# Patient Record
Sex: Male | Born: 1960 | Race: White | Hispanic: No | Marital: Married | Smoking: Former smoker
Health system: Southern US, Community
[De-identification: ages and names within clinical notes are randomized; demographics above are authoritative.]

## PROBLEM LIST (undated history)

## (undated) ENCOUNTER — Emergency Department (HOSPITAL_BASED_OUTPATIENT_CLINIC_OR_DEPARTMENT_OTHER): Disposition: A | Discharge status: Home / Self Care | Payer: BC Managed Care – PPO

## (undated) DIAGNOSIS — R55 Syncope and collapse: Secondary | ICD-10-CM

## (undated) DIAGNOSIS — M48061 Spinal stenosis, lumbar region without neurogenic claudication: Secondary | ICD-10-CM

## (undated) DIAGNOSIS — I480 Paroxysmal atrial fibrillation: Secondary | ICD-10-CM

## (undated) DIAGNOSIS — M1990 Unspecified osteoarthritis, unspecified site: Secondary | ICD-10-CM

## (undated) DIAGNOSIS — R079 Chest pain, unspecified: Secondary | ICD-10-CM

## (undated) DIAGNOSIS — I10 Essential (primary) hypertension: Secondary | ICD-10-CM

## (undated) DIAGNOSIS — G4700 Insomnia, unspecified: Secondary | ICD-10-CM

## (undated) DIAGNOSIS — K219 Gastro-esophageal reflux disease without esophagitis: Secondary | ICD-10-CM

## (undated) HISTORY — DX: Paroxysmal atrial fibrillation: I48.0

## (undated) HISTORY — DX: Essential (primary) hypertension: I10

## (undated) HISTORY — PX: TONSILLECTOMY: SUR1361

## (undated) HISTORY — PX: ANKLE RECONSTRUCTION: SHX1151

## (undated) HISTORY — PX: KNEE ARTHROSCOPY: SUR90

## (undated) HISTORY — PX: CLAVICLE SURGERY: SHX598

---

## 1998-11-05 ENCOUNTER — Ambulatory Visit (HOSPITAL_COMMUNITY): Admission: RE | Admit: 1998-11-05 | Discharge: 1998-11-05 | Payer: Self-pay | Admitting: Gastroenterology

## 1999-12-12 ENCOUNTER — Encounter: Payer: Self-pay | Admitting: Orthopedic Surgery

## 1999-12-12 ENCOUNTER — Inpatient Hospital Stay (HOSPITAL_COMMUNITY): Admission: EM | Admit: 1999-12-12 | Discharge: 1999-12-14 | Payer: Self-pay | Admitting: Emergency Medicine

## 2002-03-31 ENCOUNTER — Encounter: Payer: Self-pay | Admitting: Emergency Medicine

## 2002-03-31 ENCOUNTER — Emergency Department (HOSPITAL_COMMUNITY): Admission: EM | Admit: 2002-03-31 | Discharge: 2002-03-31 | Payer: Self-pay | Admitting: Emergency Medicine

## 2003-01-23 ENCOUNTER — Ambulatory Visit (HOSPITAL_COMMUNITY): Admission: RE | Admit: 2003-01-23 | Discharge: 2003-01-23 | Payer: Self-pay | Admitting: Orthopedic Surgery

## 2004-05-24 ENCOUNTER — Ambulatory Visit (HOSPITAL_BASED_OUTPATIENT_CLINIC_OR_DEPARTMENT_OTHER): Admission: RE | Admit: 2004-05-24 | Discharge: 2004-05-24 | Payer: Self-pay | Admitting: Orthopedic Surgery

## 2011-04-24 ENCOUNTER — Emergency Department (HOSPITAL_COMMUNITY)
Admission: EM | Admit: 2011-04-24 | Discharge: 2011-04-24 | Disposition: A | Discharge status: Home / Self Care | Payer: PRIVATE HEALTH INSURANCE | Attending: Emergency Medicine | Admitting: Emergency Medicine

## 2011-04-24 ENCOUNTER — Emergency Department (HOSPITAL_COMMUNITY): Payer: PRIVATE HEALTH INSURANCE

## 2011-04-24 ENCOUNTER — Encounter (HOSPITAL_COMMUNITY): Payer: Self-pay | Admitting: *Deleted

## 2011-04-24 DIAGNOSIS — K921 Melena: Secondary | ICD-10-CM | POA: Insufficient documentation

## 2011-04-24 DIAGNOSIS — R141 Gas pain: Secondary | ICD-10-CM | POA: Insufficient documentation

## 2011-04-24 DIAGNOSIS — R143 Flatulence: Secondary | ICD-10-CM | POA: Insufficient documentation

## 2011-04-24 DIAGNOSIS — R142 Eructation: Secondary | ICD-10-CM | POA: Insufficient documentation

## 2011-04-24 DIAGNOSIS — R109 Unspecified abdominal pain: Secondary | ICD-10-CM | POA: Insufficient documentation

## 2011-04-24 DIAGNOSIS — K5900 Constipation, unspecified: Secondary | ICD-10-CM | POA: Insufficient documentation

## 2011-04-24 LAB — COMPREHENSIVE METABOLIC PANEL
ALT: 21 U/L (ref 0–53)
AST: 21 U/L (ref 0–37)
Albumin: 4.1 g/dL (ref 3.5–5.2)
CO2: 24 mEq/L (ref 19–32)
Calcium: 9.6 mg/dL (ref 8.4–10.5)
Creatinine, Ser: 0.61 mg/dL (ref 0.50–1.35)
Sodium: 138 mEq/L (ref 135–145)
Total Protein: 7.1 g/dL (ref 6.0–8.3)

## 2011-04-24 LAB — DIFFERENTIAL
Basophils Absolute: 0 10*3/uL (ref 0.0–0.1)
Basophils Relative: 0 % (ref 0–1)
Eosinophils Absolute: 0.3 10*3/uL (ref 0.0–0.7)
Eosinophils Relative: 4 % (ref 0–5)
Lymphocytes Relative: 28 % (ref 12–46)
Monocytes Absolute: 0.6 10*3/uL (ref 0.1–1.0)

## 2011-04-24 LAB — URINALYSIS, ROUTINE W REFLEX MICROSCOPIC
Bilirubin Urine: NEGATIVE
Hgb urine dipstick: NEGATIVE
Specific Gravity, Urine: 1.028 (ref 1.005–1.030)
Urobilinogen, UA: 0.2 mg/dL (ref 0.0–1.0)
pH: 6 (ref 5.0–8.0)

## 2011-04-24 LAB — CBC
MCHC: 34.2 g/dL (ref 30.0–36.0)
MCV: 89.5 fL (ref 78.0–100.0)
Platelets: 186 10*3/uL (ref 150–400)
RDW: 14 % (ref 11.5–15.5)
WBC: 7.7 10*3/uL (ref 4.0–10.5)

## 2011-04-24 MED ORDER — IOHEXOL 300 MG/ML  SOLN: 20.0000 mL | INTRAMUSCULAR | Status: AC

## 2011-04-24 MED ORDER — FENTANYL CITRATE 0.05 MG/ML IJ SOLN
50.0000 ug | Freq: Once | INTRAMUSCULAR | Status: AC
  Administered 2011-04-24: 50 ug via INTRAVENOUS
  Filled 2011-04-24: qty 2

## 2011-04-24 MED ORDER — IOHEXOL 300 MG/ML  SOLN
100.0000 mL | Freq: Once | INTRAMUSCULAR | Status: AC | PRN
  Administered 2011-04-24: 100 mL via INTRAVENOUS

## 2011-04-24 NOTE — Discharge Instructions (Signed)

## 2011-04-24 NOTE — ED Notes (Signed)
Pt was picking up bags of topsoil and felt something in his belly but continued to work.  Later nite felt like he need to have bowel movement and had little.  Pt noticed increased gas and distention on right side of abdomen.  Pt sts abd is fuller and ? Hernia to RLQ.  Pt not passing any gas

## 2011-04-24 NOTE — ED Provider Notes (Addendum)
History     CSN: [id number]  Arrival date & time 04/24/11  1247   First MD Initiated Contact with Patient 04/24/11 1543      Chief Complaint  Patient presents with  . Abdominal Pain    (Consider location/radiation/quality/duration/timing/severity/associated sxs/prior treatment) HPI Comments: Patient presents with symptoms that began last night of right-sided abdominal pain.  Patient noted that he felt like he needed to have a bowel movement and did.  Approximately 20 minutes later he felt that he didn't have a small bowel movement.  He then throughout last night kept having that feeling but only pass flatus.  This morning he's had continuation of these symptoms without bowel movements.  He feels that his abdomen is distended and that there was an area in his right lower abdomen that have been more prominent than elsewhere.  This is a new finding for this patient.  He denies any nausea or vomiting or fevers.  No prior surgical history.  He did believe he saw blood mixed with the stool and one of his bowel movements but no others.  Patient is a 51 y.o. male presenting with abdominal pain. The history is provided by the patient. No language interpreter was used.  Abdominal Pain The primary symptoms of the illness include abdominal pain and hematochezia. The primary symptoms of the illness do not include fever, fatigue, shortness of breath, nausea, vomiting, diarrhea, hematemesis or dysuria. The current episode started yesterday. The onset of the illness was gradual. The problem has not changed since onset. Symptoms associated with the illness do not include chills, hematuria or back pain.    History reviewed. No pertinent past medical history.  History reviewed. No pertinent past surgical history.  History reviewed. No pertinent family history.  History  Substance Use Topics  . Smoking status: Former Games developer  . Smokeless tobacco: Not on file  . Alcohol Use: Yes     occ      Review  of Systems  Constitutional: Negative.  Negative for fever, chills and fatigue.  HENT: Negative.   Eyes: Negative.  Negative for discharge and redness.  Respiratory: Negative.  Negative for cough and shortness of breath.   Cardiovascular: Negative.  Negative for chest pain.  Gastrointestinal: Positive for abdominal pain, blood in stool and hematochezia. Negative for nausea, vomiting, diarrhea and hematemesis.  Genitourinary: Negative.  Negative for dysuria and hematuria.  Musculoskeletal: Negative.  Negative for back pain.  Skin: Negative.  Negative for color change and rash.  Neurological: Negative for syncope and headaches.  Hematological: Negative.  Negative for adenopathy.  Psychiatric/Behavioral: Negative.  Negative for confusion.  All other systems reviewed and are negative.    Allergies  Codeine and Sulfa antibiotics  Home Medications  No current outpatient prescriptions on file.  BP 137/89  Pulse 61  Temp(Src) 97.8 F (36.6 C) (Oral)  Resp 17  SpO2 95%  Physical Exam  Nursing note and vitals reviewed. Constitutional: He is oriented to person, place, and time. He appears well-developed and well-nourished.  Non-toxic appearance. He does not have a sickly appearance.  HENT:  Head: Normocephalic and atraumatic.  Eyes: Conjunctivae, EOM and lids are normal. Pupils are equal, round, and reactive to light.  Neck: Trachea normal, normal range of motion and full passive range of motion without pain. Neck supple.  Cardiovascular: Normal rate, regular rhythm and normal heart sounds.   Pulmonary/Chest: Effort normal and breath sounds normal. No respiratory distress.  Abdominal: Soft. Normal appearance. He exhibits distension. He exhibits  no mass. There is no tenderness. There is no rebound, no guarding and no CVA tenderness.       Mild abdominal distention but abdomen is soft with mild diffuse tenderness.  Genitourinary: Guaiac positive stool.       Normal sphincter tone.   Minimal stool in vault.  Weakly positive on Hemoccult card.  No gross blood or melena  Musculoskeletal: Normal range of motion.  Neurological: He is alert and oriented to person, place, and time. He has normal strength.  Skin: Skin is warm, dry and intact. No rash noted.  Psychiatric: He has a normal mood and affect. His behavior is normal. Judgment and thought content normal.    ED Course  Procedures (including critical care time)  Results for orders placed during the hospital encounter of 04/24/11  CBC      Component Value Range   WBC 7.7  4.0 - 10.5 (K/uL)   RBC 4.74  4.22 - 5.81 (MIL/uL)   Hemoglobin 14.5  13.0 - 17.0 (g/dL)   HCT 40.9  81.1 - 91.4 (%)   MCV 89.5  78.0 - 100.0 (fL)   MCH 30.6  26.0 - 34.0 (pg)   MCHC 34.2  30.0 - 36.0 (g/dL)   RDW 78.2  95.6 - 21.3 (%)   Platelets 186  150 - 400 (K/uL)  DIFFERENTIAL      Component Value Range   Neutrophils Relative 60  43 - 77 (%)   Neutro Abs 4.7  1.7 - 7.7 (K/uL)   Lymphocytes Relative 28  12 - 46 (%)   Lymphs Abs 2.2  0.7 - 4.0 (K/uL)   Monocytes Relative 7  3 - 12 (%)   Monocytes Absolute 0.6  0.1 - 1.0 (K/uL)   Eosinophils Relative 4  0 - 5 (%)   Eosinophils Absolute 0.3  0.0 - 0.7 (K/uL)   Basophils Relative 0  0 - 1 (%)   Basophils Absolute 0.0  0.0 - 0.1 (K/uL)  COMPREHENSIVE METABOLIC PANEL      Component Value Range   Sodium 138  135 - 145 (mEq/L)   Potassium 3.7  3.5 - 5.1 (mEq/L)   Chloride 103  96 - 112 (mEq/L)   CO2 24  19 - 32 (mEq/L)   Glucose, Bld 166 (*) 70 - 99 (mg/dL)   BUN 21  6 - 23 (mg/dL)   Creatinine, Ser 0.86  0.50 - 1.35 (mg/dL)   Calcium 9.6  8.4 - 57.8 (mg/dL)   Total Protein 7.1  6.0 - 8.3 (g/dL)   Albumin 4.1  3.5 - 5.2 (g/dL)   AST 21  0 - 37 (U/L)   ALT 21  0 - 53 (U/L)   Alkaline Phosphatase 73  39 - 117 (U/L)   Total Bilirubin 0.2 (*) 0.3 - 1.2 (mg/dL)   GFR calc non Af Amer >90  >90 (mL/min)   GFR calc Af Amer >90  >90 (mL/min)  URINALYSIS, ROUTINE W REFLEX MICROSCOPIC       Component Value Range   Color, Urine YELLOW  YELLOW    APPearance CLEAR  CLEAR    Specific Gravity, Urine 1.028  1.005 - 1.030    pH 6.0  5.0 - 8.0    Glucose, UA NEGATIVE  NEGATIVE (mg/dL)   Hgb urine dipstick NEGATIVE  NEGATIVE    Bilirubin Urine NEGATIVE  NEGATIVE    Ketones, ur NEGATIVE  NEGATIVE (mg/dL)   Protein, ur NEGATIVE  NEGATIVE (mg/dL)   Urobilinogen, UA 0.2  0.0 - 1.0 (mg/dL)   Nitrite NEGATIVE  NEGATIVE    Leukocytes, UA NEGATIVE  NEGATIVE    Ct Abdomen Pelvis W Contrast  04/24/2011  *RADIOLOGY REPORT*  Clinical Data: Right-sided pain.  Decreased bowel movements. Nausea and vomiting.  CT ABDOMEN AND PELVIS WITH CONTRAST  Technique:  Multidetector CT imaging of the abdomen and pelvis was performed following the standard protocol during bolus administration of intravenous contrast.  Contrast: OMNIPAQUE IOHEXOL 300 MG/ML  SOLN  Comparison: None.  Findings: No extraluminal bowel inflammatory process, free fluid or free air.  Fatty liver.  Tiny low density lesion left lobe possibly a cyst. No focal splenic, pancreatic, adrenal or left renal lesion.  Sub centimeter lower pole right renal lesion too small to characterize although statistically likely a cyst.  Atherosclerotic type changes including calcification of the mid to lower abdominal aorta and iliac arteries consistent with advanced atherosclerotic type changes for the patient's age.  Minimal atelectatic changes/scarring lung bases.  Top normal sized prostate gland with calcifications.  Clinical and laboratory correlation recommended.  Degenerative changes lumbar spine without bony destructive lesion. Mild hip joint degenerative changes.  IMPRESSION: No extraluminal bowel inflammatory process, free fluid or free air.  Fatty liver.  Atherosclerotic type change advanced for the patient's age.  Top normal sized prostate gland with calcifications.  Please see above.  Original Report Authenticated By: Fuller Canada, M.D.   Dg Abd  Acute W/chest  04/24/2011  *RADIOLOGY REPORT*  Clinical Data: Abdominal pain, bloating, rule out small bowel obstruction  ACUTE ABDOMEN SERIES (ABDOMEN 2 VIEW & CHEST 1 VIEW)  Comparison: None.  Findings: Cardiomediastinal silhouette is unremarkable.  No acute infiltrate or pleural effusion.  No pulmonary edema.  There is nonspecific nonobstructive bowel gas pattern.  No free abdominal air is noted.  Moderate stool noted in the right colon.  IMPRESSION: No acute disease.  Nonspecific nonobstructive bowel gas pattern. No free abdominal air.  Original Report Authenticated By: Natasha Mead, M.D.      MDM  Patient presents with right-sided abdominal pain with change in bowel habits.  He is lower risk for small bowel obstruction since he has no surgical history.  Patient is afebrile here with some mild tenderness on exam.  I will obtain a CT abdomen pelvis based on the patient's decreased bowel movements with increased pain as it may be an atypical presentation for appendicitis.        Nat Christen, MD 04/24/11 1754  Nat Christen, MD 04/24/11 [phone]  Patient with no acute pathology on his CT scan.  Patient may have a constipation or viral process at this time and I feel he is safe for discharge home.  Nat Christen, MD 04/24/11 [phone]

## 2012-01-12 ENCOUNTER — Other Ambulatory Visit: Payer: Self-pay | Admitting: Surgical

## 2012-01-24 NOTE — H&P (Signed)
Justin Young is an 52 y.o. male.   Chief Complaint: left knee pain HPI: The patient is a 52 year old male who presented with the chief complaint of left knee pain. He had a sudden onset of pain in the medial aspect of his left knee. He was sitting on the floor watching TV and had an immediate pain like something was happening in the medial side of his knee. He was doing fine before that he said. He had two previous knee arthroscopies by Dr. Montez Morita on the opposite right knee. MRI revealed complex tear of the medial meniscus in the left knee.    Past Medical History No pertinent past medical history  Past Surgical History Tonsillectomy Bilateral knee arthroscopy Left ankle ORIF Vasectomy  Family History Mother: HTN, DM type II, CKD, cancer Father: heart disease, HTN, CVA  Social History:  reports that he has quit smoking. He does not have any smokeless tobacco history on file. He reports that he drinks alcohol. He reports that he does not use illicit drugs.  Allergies:  Allergies  Allergen Reactions  . Codeine Itching  . Sulfa Antibiotics Other (See Comments)    From when younger.    Review of Systems  Constitutional: Negative.   HENT: Negative.  Negative for neck pain.   Eyes: Negative.   Respiratory: Negative.   Cardiovascular: Negative.   Gastrointestinal: Negative.   Genitourinary: Negative.   Musculoskeletal: Positive for joint pain. Negative for myalgias, back pain and falls.       Left knee pain  Skin: Negative.   Neurological: Negative.   Endo/Heme/Allergies: Negative.   Psychiatric/Behavioral: Negative.    Vitals Weight: 215 lb Height: 70.25 in Body Surface Area: 2.2 m Body Mass Index: 30.63 kg/m Pulse: 64 (Regular) BP: 130/85 (Sitting, Left Arm, Standard)  Physical Exam  Constitutional: He is oriented to person, place, and time. He appears well-developed and well-nourished. No distress.  HENT:  Head: Normocephalic and atraumatic.  Right Ear:  External ear normal.  Left Ear: External ear normal.  Nose: Nose normal.  Mouth/Throat: Oropharynx is clear and moist.  Eyes: Conjunctivae normal and EOM are normal.  Neck: Neck supple. No tracheal deviation present. No thyromegaly present.  Cardiovascular: Normal rate, regular rhythm, normal heart sounds and intact distal pulses.   No murmur heard. Respiratory: Effort normal and breath sounds normal. No respiratory distress. He has no wheezes. He exhibits no tenderness.  GI: Soft. Bowel sounds are normal. He exhibits no distension and no mass. There is no tenderness.  Musculoskeletal:       Right hip: Normal.       Left hip: Normal.       Right knee: Normal.       Left knee: He exhibits decreased range of motion and effusion. He exhibits no erythema. tenderness found. Medial joint line tenderness noted.       Right lower leg: He exhibits no tenderness and no swelling.       Left lower leg: He exhibits no tenderness and no swelling.  Lymphadenopathy:    He has no cervical adenopathy.  Neurological: He is alert and oriented to person, place, and time. He has normal strength. No sensory deficit.  Skin: No rash noted. He is not diaphoretic. No erythema.  Psychiatric: He has a normal mood and affect. His behavior is normal.     Assessment/Plan Medial meniscus tear in left knee He will undergo a left knee arthroscopy with medial menisectomy. This will be an outpatient  procedure. Risks and benefits of the surgery were discussed with the patient by Dr. Darrelyn Hillock.  Sherlene Rickel LAUREN 01/24/2012, 2:11 PM

## 2012-01-25 ENCOUNTER — Encounter (HOSPITAL_BASED_OUTPATIENT_CLINIC_OR_DEPARTMENT_OTHER): Payer: Self-pay | Admitting: *Deleted

## 2012-01-25 NOTE — Progress Notes (Signed)
To [facility] at 1145-Hg on arrival-possible EKG-Npo after mn-instructed to bring contact case/solution.

## 2012-01-27 ENCOUNTER — Encounter (HOSPITAL_BASED_OUTPATIENT_CLINIC_OR_DEPARTMENT_OTHER)
Admission: RE | Disposition: A | Discharge status: Home / Self Care | Payer: Self-pay | Source: Ambulatory Visit | Attending: Orthopedic Surgery

## 2012-01-27 ENCOUNTER — Encounter (HOSPITAL_BASED_OUTPATIENT_CLINIC_OR_DEPARTMENT_OTHER): Payer: Self-pay | Admitting: Anesthesiology

## 2012-01-27 ENCOUNTER — Ambulatory Visit (HOSPITAL_BASED_OUTPATIENT_CLINIC_OR_DEPARTMENT_OTHER)
Admission: RE | Admit: 2012-01-27 | Discharge: 2012-01-27 | Disposition: A | Discharge status: Home / Self Care | Payer: PRIVATE HEALTH INSURANCE | Source: Ambulatory Visit | Attending: Orthopedic Surgery | Admitting: Orthopedic Surgery

## 2012-01-27 ENCOUNTER — Ambulatory Visit (HOSPITAL_BASED_OUTPATIENT_CLINIC_OR_DEPARTMENT_OTHER): Payer: PRIVATE HEALTH INSURANCE | Admitting: Anesthesiology

## 2012-01-27 ENCOUNTER — Encounter (HOSPITAL_BASED_OUTPATIENT_CLINIC_OR_DEPARTMENT_OTHER): Payer: Self-pay | Admitting: *Deleted

## 2012-01-27 DIAGNOSIS — S83242A Other tear of medial meniscus, current injury, left knee, initial encounter: Secondary | ICD-10-CM | POA: Diagnosis present

## 2012-01-27 DIAGNOSIS — M1710 Unilateral primary osteoarthritis, unspecified knee: Secondary | ICD-10-CM | POA: Insufficient documentation

## 2012-01-27 DIAGNOSIS — M1712 Unilateral primary osteoarthritis, left knee: Secondary | ICD-10-CM | POA: Diagnosis present

## 2012-01-27 DIAGNOSIS — M23329 Other meniscus derangements, posterior horn of medial meniscus, unspecified knee: Secondary | ICD-10-CM | POA: Insufficient documentation

## 2012-01-27 HISTORY — DX: Insomnia, unspecified: G47.00

## 2012-01-27 HISTORY — PX: KNEE ARTHROSCOPY WITH MEDIAL MENISECTOMY: SHX5651

## 2012-01-27 HISTORY — DX: Gastro-esophageal reflux disease without esophagitis: K21.9

## 2012-01-27 SURGERY — ARTHROSCOPY, KNEE, WITH MEDIAL MENISCECTOMY
Anesthesia: General | Site: Knee | Laterality: Left | Wound class: Clean Contaminated

## 2012-01-27 MED ORDER — LACTATED RINGERS IV SOLN
INTRAVENOUS | Status: DC
  Administered 2012-01-27: 12:00:00 via INTRAVENOUS
  Filled 2012-01-27: qty 1000

## 2012-01-27 MED ORDER — LIDOCAINE HCL (CARDIAC) 20 MG/ML IV SOLN
INTRAVENOUS | Status: DC | PRN
  Administered 2012-01-27: 80 mg via INTRAVENOUS

## 2012-01-27 MED ORDER — ACETAMINOPHEN 10 MG/ML IV SOLN
1000.0000 mg | Freq: Once | INTRAVENOUS | Status: DC | PRN
  Filled 2012-01-27: qty 100

## 2012-01-27 MED ORDER — MEPERIDINE HCL 25 MG/ML IJ SOLN
6.2500 mg | INTRAMUSCULAR | Status: DC | PRN
  Filled 2012-01-27: qty 1

## 2012-01-27 MED ORDER — CHLORHEXIDINE GLUCONATE 4 % EX LIQD
60.0000 mL | Freq: Once | CUTANEOUS | Status: DC
  Filled 2012-01-27: qty 60

## 2012-01-27 MED ORDER — PROMETHAZINE HCL 25 MG/ML IJ SOLN
6.2500 mg | INTRAMUSCULAR | Status: DC | PRN
  Filled 2012-01-27: qty 1

## 2012-01-27 MED ORDER — SODIUM CHLORIDE 0.9 % IR SOLN
Status: DC | PRN
  Administered 2012-01-27: 12000 mL

## 2012-01-27 MED ORDER — CEFAZOLIN SODIUM-DEXTROSE 2-3 GM-% IV SOLR
2.0000 g | INTRAVENOUS | Status: AC
  Administered 2012-01-27: 2 g via INTRAVENOUS
  Filled 2012-01-27: qty 50

## 2012-01-27 MED ORDER — FENTANYL CITRATE 0.05 MG/ML IJ SOLN
INTRAMUSCULAR | Status: DC | PRN
  Administered 2012-01-27: 100 ug via INTRAVENOUS
  Administered 2012-01-27 (×2): 50 ug via INTRAVENOUS

## 2012-01-27 MED ORDER — OXYCODONE HCL 5 MG/5ML PO SOLN
5.0000 mg | Freq: Once | ORAL | Status: AC | PRN
  Filled 2012-01-27: qty 5

## 2012-01-27 MED ORDER — HYDROMORPHONE HCL PF 1 MG/ML IJ SOLN
0.2500 mg | INTRAMUSCULAR | Status: DC | PRN
  Administered 2012-01-27 (×2): 0.5 mg via INTRAVENOUS
  Filled 2012-01-27: qty 1

## 2012-01-27 MED ORDER — ONDANSETRON HCL 4 MG/2ML IJ SOLN
INTRAMUSCULAR | Status: DC | PRN
  Administered 2012-01-27: 4 mg via INTRAVENOUS

## 2012-01-27 MED ORDER — ACETAMINOPHEN 10 MG/ML IV SOLN
INTRAVENOUS | Status: DC | PRN
  Administered 2012-01-27: 1000 mg via INTRAVENOUS

## 2012-01-27 MED ORDER — LACTATED RINGERS IV SOLN
INTRAVENOUS | Status: DC
  Filled 2012-01-27: qty 1000

## 2012-01-27 MED ORDER — OXYCODONE-ACETAMINOPHEN 5-325 MG PO TABS
1.0000 | ORAL_TABLET | ORAL | Status: DC | PRN
  Administered 2012-01-27: 1 via ORAL
  Filled 2012-01-27: qty 1

## 2012-01-27 MED ORDER — MIDAZOLAM HCL 5 MG/5ML IJ SOLN
INTRAMUSCULAR | Status: DC | PRN
  Administered 2012-01-27: 2 mg via INTRAVENOUS

## 2012-01-27 MED ORDER — DEXAMETHASONE SODIUM PHOSPHATE 4 MG/ML IJ SOLN
INTRAMUSCULAR | Status: DC | PRN
  Administered 2012-01-27: 10 mg via INTRAVENOUS

## 2012-01-27 MED ORDER — BUPIVACAINE-EPINEPHRINE 0.25% -1:200000 IJ SOLN
INTRAMUSCULAR | Status: DC | PRN
  Administered 2012-01-27: 30 mL

## 2012-01-27 MED ORDER — PROPOFOL 10 MG/ML IV BOLUS
INTRAVENOUS | Status: DC | PRN
  Administered 2012-01-27: 200 mg via INTRAVENOUS

## 2012-01-27 MED ORDER — OXYCODONE HCL 5 MG PO TABS
5.0000 mg | ORAL_TABLET | Freq: Once | ORAL | Status: AC | PRN
  Administered 2012-01-27: 5 mg via ORAL
  Filled 2012-01-27: qty 1

## 2012-01-27 MED ORDER — OXYCODONE-ACETAMINOPHEN 10-650 MG PO TABS: 1.0000 | ORAL_TABLET | Freq: Four times a day (QID) | ORAL | Status: DC | PRN

## 2012-01-27 MED ORDER — BACITRACIN-NEOMYCIN-POLYMYXIN 400-5-5000 EX OINT
TOPICAL_OINTMENT | CUTANEOUS | Status: DC | PRN
  Administered 2012-01-27: 1 via TOPICAL

## 2012-01-27 SURGICAL SUPPLY — 42 items
BANDAGE ELASTIC 4 VELCRO ST LF (GAUZE/BANDAGES/DRESSINGS) ×2 IMPLANT
BANDAGE ELASTIC 6 VELCRO ST LF (GAUZE/BANDAGES/DRESSINGS) ×2 IMPLANT
BLADE CUDA 5.5 (BLADE) IMPLANT
BLADE CUTTER GATOR 3.5 (BLADE) IMPLANT
BLADE CUTTER MENIS 5.5 (BLADE) IMPLANT
BLADE GREAT WHITE 4.2 (BLADE) ×2 IMPLANT
BLADE SURG 15 STRL LF DISP TIS (BLADE) IMPLANT
BLADE SURG 15 STRL SS (BLADE)
BNDG COHESIVE 4X5 TAN NS LF (GAUZE/BANDAGES/DRESSINGS) ×2 IMPLANT
CANISTER SUCT LVC 12 LTR MEDI- (MISCELLANEOUS) ×2 IMPLANT
CANISTER SUCTION 2500CC (MISCELLANEOUS) ×2 IMPLANT
CLOTH BEACON ORANGE TIMEOUT ST (SAFETY) ×2 IMPLANT
DRAPE ARTHROSCOPY W/POUCH 114 (DRAPES) ×2 IMPLANT
DRAPE LG THREE QUARTER DISP (DRAPES) ×2 IMPLANT
DRSG EMULSION OIL 3X3 NADH (GAUZE/BANDAGES/DRESSINGS) ×2 IMPLANT
DRSG PAD ABDOMINAL 8X10 ST (GAUZE/BANDAGES/DRESSINGS) ×2 IMPLANT
DURAPREP 26ML APPLICATOR (WOUND CARE) ×2 IMPLANT
ELECT MENISCUS 165MM 90D (ELECTRODE) IMPLANT
ELECT REM PT RETURN 9FT ADLT (ELECTROSURGICAL)
ELECTRODE REM PT RTRN 9FT ADLT (ELECTROSURGICAL) IMPLANT
GLOVE ECLIPSE 7.0 STRL STRAW (GLOVE) ×2 IMPLANT
GLOVE ECLIPSE 8.0 STRL XLNG CF (GLOVE) ×2 IMPLANT
GLOVE INDICATOR 7.0 STRL GRN (GLOVE) ×2 IMPLANT
GLOVE INDICATOR 8.5 STRL (GLOVE) ×4 IMPLANT
GLOVE SURG ORTHO 6.0 STRL STRW (GLOVE) ×2 IMPLANT
GOWN PREVENTION PLUS LG XLONG (DISPOSABLE) ×2 IMPLANT
GOWN STRL REIN XL XLG (GOWN DISPOSABLE) ×2 IMPLANT
KNEE WRAP E Z 3 GEL PACK (MISCELLANEOUS) ×2 IMPLANT
MINI VAC (SURGICAL WAND) ×2 IMPLANT
PACK ARTHROSCOPY DSU (CUSTOM PROCEDURE TRAY) ×2 IMPLANT
PACK BASIN DAY SURGERY FS (CUSTOM PROCEDURE TRAY) ×2 IMPLANT
PADDING CAST COTTON 6X4 STRL (CAST SUPPLIES) ×2 IMPLANT
PENCIL BUTTON HOLSTER BLD 10FT (ELECTRODE) IMPLANT
SET ARTHROSCOPY TUBING (MISCELLANEOUS) ×2
SET ARTHROSCOPY TUBING LN (MISCELLANEOUS) ×1 IMPLANT
SPONGE GAUZE 4X4 12PLY (GAUZE/BANDAGES/DRESSINGS) ×2 IMPLANT
SPONGE SURGIFOAM ABS GEL 12-7 (HEMOSTASIS) IMPLANT
SUT ETHILON 3 0 PS 1 (SUTURE) ×2 IMPLANT
SYR CONTROL 10ML LL (SYRINGE) IMPLANT
TOWEL OR 17X24 6PK STRL BLUE (TOWEL DISPOSABLE) ×2 IMPLANT
WAND 90 DEG TURBOVAC W/CORD (SURGICAL WAND) ×2 IMPLANT
WATER STERILE IRR 500ML POUR (IV SOLUTION) ×2 IMPLANT

## 2012-01-27 NOTE — Brief Op Note (Signed)
01/27/2012  2:16 PM  PATIENT:  Justin Young  52 y.o. male  PRE-OPERATIVE DIAGNOSIS:  LEFT KNEE MEDIAL MENISCAL TEAR,and Osteoarthritis POST-OPERATIVE DIAGNOSIS:  Left knee medial meniscal tear, and Osteoarthritis  PROCEDURE:  Procedure(s) (LRB) with comments: KNEE ARTHROSCOPY WITH MEDIAL MENISECTOMY (Left)and Synovectomy of Suprapatellar Pouch,Abraision Chondroplasty Of Medial Femoral Condyle and Microfracture Technique Of Med.Femoral Condyle.Diagnostic Arthroscopy of Left Knee. SURGEON:  Surgeon(s) and Role:    * Jacki Cones, MD - Primary    ASSISTANTS: OR Nurse  ANESTHESIA:   general  EBL:  Total I/O In: 200 [I.V.:200] Out: -   BLOOD ADMINISTERED:none  DRAINS: none   LOCAL MEDICATIONS USED:  MARCAINE 30cc of 0.25% Marcaine with Epinephrine.    SPECIMEN:  No Specimen  DISPOSITION OF SPECIMEN:  N/A  COUNTS:  YES  TOURNIQUET:  * No tourniquets in log *  DICTATION: .Other Dictation: Dictation Number [id number]  PLAN OF CARE: Discharge to home after PACU  PATIENT DISPOSITION:  PACU - hemodynamically stable.   Delay start of Pharmacological VTE agent (>24hrs) due to surgical blood loss or risk of bleeding: yes

## 2012-01-27 NOTE — Anesthesia Postprocedure Evaluation (Signed)
  Anesthesia Post-op Note  Patient: Justin Young  Procedure(s) Performed: Procedure(s) (LRB): KNEE ARTHROSCOPY WITH MEDIAL MENISECTOMY (Left)  Patient Location: PACU  Anesthesia Type: General  Level of Consciousness: awake and alert   Airway and Oxygen Therapy: Patient Spontanous Breathing  Post-op Pain: mild  Post-op Assessment: Post-op Vital signs reviewed, Patient's Cardiovascular Status Stable, Respiratory Function Stable, Patent Airway and No signs of Nausea or vomiting  Last Vitals:  Filed Vitals:   01/27/12 1506  BP:   Pulse: 72  Temp:   Resp: 16    Post-op Vital Signs: stable   Complications: No apparent anesthesia complications

## 2012-01-27 NOTE — Anesthesia Procedure Notes (Signed)
Procedure Name: LMA Insertion Date/Time: 01/27/2012 1:28 PM Performed by: Norva Pavlov Pre-anesthesia Checklist: Patient identified, Emergency Drugs available, Suction available and Patient being monitored Patient Re-evaluated:Patient Re-evaluated prior to inductionOxygen Delivery Method: Circle System Utilized Preoxygenation: Pre-oxygenation with 100% oxygen Intubation Type: IV induction Ventilation: Mask ventilation without difficulty LMA: LMA inserted LMA Size: 4.0 Number of attempts: 1 Airway Equipment and Method: bite block Placement Confirmation: positive ETCO2 Tube secured with: Tape Dental Injury: Teeth and Oropharynx as per pre-operative assessment

## 2012-01-27 NOTE — H&P (View-Only) (Signed)
To [facility] at 1145-Hg on arrival-possible EKG-Npo after mn-instructed to bring contact case/solution. 

## 2012-01-27 NOTE — Transfer of Care (Signed)
Immediate Anesthesia Transfer of Care Note  Patient: Justin Young  Procedure(s) Performed: Procedure(s) (LRB): KNEE ARTHROSCOPY WITH MEDIAL MENISECTOMY (Left)  Patient Location: PACU  Anesthesia Type: General  Level of Consciousness: awake, alert  and oriented  Airway & Oxygen Therapy: Patient Spontanous Breathing and Patient connected to face mask oxygen  Post-op Assessment: Report given to PACU RN and Post -op Vital signs reviewed and stable  Post vital signs: Reviewed and stable  Complications: No apparent anesthesia complications

## 2012-01-27 NOTE — Interval H&P Note (Signed)
History and Physical Interval Note:  01/27/2012 1:13 PM  Justin Young  has presented today for surgery, with the diagnosis of LEFT KNEE MEDIAL MENISCAL TEAR  The various methods of treatment have been discussed with the patient and family. After consideration of risks, benefits and other options for treatment, the patient has consented to  Procedure(s) (LRB) with comments: KNEE ARTHROSCOPY WITH MEDIAL MENISECTOMY (Left) as a surgical intervention .  The patient's history has been reviewed, patient examined, no change in status, stable for surgery.  I have reviewed the patient's chart and labs.  Questions were answered to the patient's satisfaction.     Chalmers Iddings A

## 2012-01-27 NOTE — Anesthesia Preprocedure Evaluation (Addendum)
Anesthesia Evaluation  Patient identified by MRN, date of birth, ID band Patient awake    Reviewed: Allergy & Precautions, H&P , NPO status , Patient's Chart, lab work & pertinent test results  Airway Mallampati: II TM Distance: >3 FB Neck ROM: Full    Dental  (+) Dental Advisory Given and Teeth Intact   Pulmonary former smoker,  breath sounds clear to auscultation  Pulmonary exam normal       Cardiovascular - Past MI and - CHF Rhythm:Regular Rate:Normal     Neuro/Psych negative neurological ROS  negative psych ROS   GI/Hepatic Neg liver ROS, GERD-  Medicated,  Endo/Other  negative endocrine ROS  Renal/GU negative Renal ROS     Musculoskeletal negative musculoskeletal ROS (+)   Abdominal   Peds  Hematology negative hematology ROS (+)   Anesthesia Other Findings   Reproductive/Obstetrics                          Anesthesia Physical Anesthesia Plan  ASA: II  Anesthesia Plan: General   Post-op Pain Management:    Induction: Intravenous  Airway Management Planned: LMA  Additional Equipment:   Intra-op Plan:   Post-operative Plan: Extubation in OR  Informed Consent: I have reviewed the patients History and Physical, chart, labs and discussed the procedure including the risks, benefits and alternatives for the proposed anesthesia with the patient or authorized representative who has indicated his/her understanding and acceptance.   Dental advisory given  Plan Discussed with: CRNA  Anesthesia Plan Comments:         Anesthesia Quick Evaluation

## 2012-01-28 NOTE — Anesthesia Postprocedure Evaluation (Signed)
Anesthesia Post Note  Patient: Justin Young  Procedure(s) Performed: Procedure(s) (LRB): KNEE ARTHROSCOPY WITH MEDIAL MENISECTOMY (Left)  Anesthesia type: General  Patient location: PACU  Post pain: Pain level controlled  Post assessment: Post-op Vital signs reviewed  Last Vitals: BP 155/105  Pulse 71  Temp 36.1 C (Oral)  Resp 16  Ht 5' 10.5" (1.791 m)  Wt 207 lb 5 oz (94.036 kg)  BMI 29.33 kg/m2  SpO2 93%  Post vital signs: Reviewed  Level of consciousness: sedated  Complications: No apparent anesthesia complications

## 2012-01-30 ENCOUNTER — Encounter (HOSPITAL_BASED_OUTPATIENT_CLINIC_OR_DEPARTMENT_OTHER): Payer: Self-pay | Admitting: Orthopedic Surgery

## 2012-01-30 NOTE — Op Note (Signed)
Justin Young, Justin Young               ACCOUNT NO.:  [id number]  MEDICAL RECORD NO.:  [id number]  LOCATION:                                 FACILITY:  PHYSICIAN:  Georges Lynch. Darrelyn Hillock, M.D.     DATE OF BIRTH:  DATE OF PROCEDURE:  01/27/2012 DATE OF DISCHARGE:                              OPERATIVE REPORT   SURGEON:  Georges Lynch. Roxana Lai, M.D.  ASSISTANT:  OR nurse.  PREOPERATIVE DIAGNOSES: 1. Degenerative arthritis, left knee. 2. Torn medial meniscus of the posterior horn, left knee.  POSTOPERATIVE DIAGNOSES: 1. Degenerative arthritis, left knee. 2. Torn medial meniscus of the posterior horn, left knee.  OPERATION: 1. Diagnostic arthroscopy, left knee. 2. Medial meniscectomy, left knee. 3. Abrasion chondroplasty to bleeding bone, medial femoral condyle,     left knee. 4. Microfracture technique, medial femoral condyle, left knee. 5. Synovectomy of suprapatellar pouch, left knee.  PROCEDURE:  Under general anesthesia, routine orthopedic prepping and draping in the left lower extremity was carried out.  The patient's left leg was placed in a knee holder.  He had 2 g of IV Ancef preop.  At this time, the appropriate time-out was carried out first.  I also marked the appropriate left leg in the holding area.  Small punctate incision was made in the suprapatellar pouch, the inflow cannula was inserted in the knee and was distended with saline.  Another small punctate incision was made in the anterolateral joint.  The arthroscope was entered from lateral approach and a complete diagnostic arthroscopy was carried out. The cruciates were first of all were intact.  I went up in the suprapatellar pouch.  The patellofemoral joint looked fine.  He had a chronic synovitis.  I did a synovectomy utilizing the ArthroCare.  I went down in the lateral joint and he had some mild early arthritic changes, but the meniscus was intact.  The cruciates were intact.  I went over the medial joint, he had  significant osteoarthritic changes especially at the medial femoral condyle.  I introduced a shaver suction device first, cleaned off the condyles and did an abrasion chondroplasty and then utilized the microfracture technique to bleeding bone on the medial femoral condyle.  Once this was completed and the medial meniscectomy was completed, I went up into the suprapatellar pouch and did a synovectomy in the suprapatellar pouch. 1. I did a diagnostic arthroscopy, left knee. 2. Synovectomy, suprapatellar pouch, left knee. 3. A medial meniscectomy, left knee. 4. Abrasion chondroplasty, medial femoral condyle, left knee. 5. Microfracture technique, medial femoral condyle, left knee.          ______________________________ Georges Lynch Darrelyn Hillock, M.D.     RAG/MEDQ  D:  01/27/2012  T:  01/28/2012  Job:  [id number]

## 2012-02-22 ENCOUNTER — Telehealth: Payer: Self-pay | Admitting: Internal Medicine

## 2012-02-22 NOTE — Telephone Encounter (Signed)
This is Justin Young son and he is wanting to see Dr. Debby Bud as a new patient, would you be willing to accept? He is having blood pressure problems and wants to be worked in rather soon, please advise

## 2012-02-22 NOTE — Telephone Encounter (Signed)
K

## 2012-02-23 ENCOUNTER — Other Ambulatory Visit (INDEPENDENT_AMBULATORY_CARE_PROVIDER_SITE_OTHER): Payer: PRIVATE HEALTH INSURANCE

## 2012-02-23 ENCOUNTER — Encounter: Payer: Self-pay | Admitting: Internal Medicine

## 2012-02-23 ENCOUNTER — Ambulatory Visit (INDEPENDENT_AMBULATORY_CARE_PROVIDER_SITE_OTHER): Payer: PRIVATE HEALTH INSURANCE | Admitting: Internal Medicine

## 2012-02-23 VITALS — BP 122/86 | HR 79 | Temp 98.0°F | Resp 10 | Ht 70.0 in | Wt 205.0 lb

## 2012-02-23 DIAGNOSIS — E279 Disorder of adrenal gland, unspecified: Secondary | ICD-10-CM

## 2012-02-23 DIAGNOSIS — I10 Essential (primary) hypertension: Secondary | ICD-10-CM

## 2012-02-23 MED ORDER — METOPROLOL TARTRATE 25 MG PO TABS: 25.0000 mg | ORAL_TABLET | Freq: Two times a day (BID) | ORAL | Status: DC

## 2012-02-23 NOTE — Telephone Encounter (Signed)
Patient scheduled.

## 2012-02-23 NOTE — Progress Notes (Signed)
Subjective:    Patient ID: Justin Young, male    DOB: [birth year], 52 y.o.   MRN: [id number]  HPI Mr. Crite presents for acute evaluation for blood pressure issues. He was told 6 months ago that his BP was up. He had arthroscopic knee surgery and his BP was elevated. Several days after surgery he had an episode of palor, sweating, tremulous. He has 5+ episodes over the last several days. He does admit to a feeling of being "adrenalized." He has previously been very healthy.  Past Medical History  Diagnosis Date  . GERD (gastroesophageal reflux disease)     mild -rolaids if necessary  . Insomnia   . Hypertension    Past Surgical History  Procedure Laterality Date  . Knee arthroscopy  2006-most current    left knee  . Clavicle surgery      bilateral  . Ankle reconstruction  left    3 procedures total  . Tonsillectomy    . Knee arthroscopy with medial menisectomy  01/27/2012    Procedure: KNEE ARTHROSCOPY WITH MEDIAL MENISECTOMY;  Surgeon: Jacki Cones, MD;  Location: [facility];  Service: Orthopedics;  Laterality: Left;   Family History  Problem Relation Age of Onset  . Arthritis Mother   . Cancer Mother     colon cancer; uterine   . Diabetes Mother   . Alcohol abuse Father   . Hyperlipidemia Father   . Hypertension Father   . Heart disease Father   . Arthritis Maternal Grandmother   . Heart disease Sister   . Hypertension Sister   . Sleep apnea Sister    History   Social History  . Marital Status: Married    Spouse Name: Gavin Pound    Number of Children: 3  . Years of Education: 18   Occupational History  . forestry systems     self-employed   Social History Main Topics  . Smoking status: Former Smoker    Quit date: 01/25/2004  . Smokeless tobacco: Never Used  . Alcohol Use: 4.2 oz/week    7 Shots of liquor per week     Comment: occ  . Drug Use: No  . Sexually Active: Yes -- Male partner(s)   Other Topics Concern  . Not on file    Social History Narrative   [employer], [employer] - BS [place], [employer] -misc business classes.Married '84 wife Gavin Pound:    3 kids - g b g: '86, '88, '95. 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   Regular exercise: active      Colonoscopy 7 yrs ago    No current outpatient prescriptions on file prior to visit.   No current facility-administered medications on file prior to visit.       Review of Systems System review is negative for any constitutional, cardiac, pulmonary, GI or neuro symptoms or complaints other than as described in the HPI.     Objective:   Physical Exam Filed Vitals:   02/23/12 1538  BP: 122/86  Pulse: 79  Temp: 98 F (36.7 C)  Resp: 10   Gen'l- WNWD white man in no acute distress but reports feeling racy, having sweaty palms during the exam HEENT- C&S clear, fundiscopic exam - sharp disc margine, no exudates or hemorrhages, no AV nicking Nekc - supple Cor - 2+ radial pulse, RRR PUlm - normal respirations Abd- BS+ x 4, no HSM, on deep palpation no mass at lateral abdomen at level  of umbilicus but he did have tenderness on the right Neuro - A&O x 3, non-focal exam.       Assessment & Plan:

## 2012-02-23 NOTE — Patient Instructions (Addendum)
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 This is a non-malignant tumor that over produces catecholamines - like adrenaline , in surges.  PLan Lab - chemistries, A1C for sugar, 24 hour urine collection for catecholamines and metanephrines.  If this is not the answer, due to the classic nature of your symptoms, will pursue more esoteric testing.  For symptoms including BP elevations will start a low dose beta-blocker: lopressor 25 mg twice a day.

## 2012-02-24 LAB — COMPREHENSIVE METABOLIC PANEL
ALT: 20 U/L (ref 0–53)
AST: 19 U/L (ref 0–37)
CO2: 28 mEq/L (ref 19–32)
Creatinine, Ser: 0.8 mg/dL (ref 0.4–1.5)
GFR: 105.45 mL/min (ref >60.00)
Total Bilirubin: 0.5 mg/dL (ref 0.3–1.2)

## 2012-02-25 ENCOUNTER — Encounter: Payer: Self-pay | Admitting: Internal Medicine

## 2012-02-25 DIAGNOSIS — I10 Essential (primary) hypertension: Secondary | ICD-10-CM | POA: Insufficient documentation

## 2012-02-25 NOTE — Assessment & Plan Note (Addendum)
Presenting with a recent history of paroxysmal hypertension along with mild agitation, sense of danger/doom, sweating, restlessness. Mild tenderness after deep palpation right abdomen. Sypmtoms are strongly suggestive of catecholamine surge. Reviewed labs on record - new onset hyperglycemia  Plan  Cmet, TSH, A1C  24 hr Urine for metanephrines, catecholamines.  Start low dose BB - lopressor 25 mg bid.

## 2012-02-26 ENCOUNTER — Encounter: Payer: Self-pay | Admitting: Internal Medicine

## 2012-02-27 ENCOUNTER — Other Ambulatory Visit: Payer: PRIVATE HEALTH INSURANCE

## 2012-02-27 DIAGNOSIS — E279 Disorder of adrenal gland, unspecified: Secondary | ICD-10-CM

## 2012-03-03 LAB — METANEPHRINES, URINE, 24 HOUR: Metaneph Total, Ur: 496 mcg/24 h (ref 224–832)

## 2012-03-04 LAB — CATECHOLAMINES, FRACTIONATED, URINE, 24 HOUR
Dopamine, 24 hr Urine: 295 mcg/24 h (ref 52–480)
Epinephrine, 24 hr Urine: 8 mcg/24 h (ref 2–24)
Total Volume - CF 24Hr U: 2000 mL

## 2012-03-05 ENCOUNTER — Encounter: Payer: Self-pay | Admitting: Internal Medicine

## 2012-03-07 ENCOUNTER — Ambulatory Visit (INDEPENDENT_AMBULATORY_CARE_PROVIDER_SITE_OTHER)
Admission: RE | Admit: 2012-03-07 | Discharge: 2012-03-07 | Disposition: A | Discharge status: Home / Self Care | Payer: PRIVATE HEALTH INSURANCE | Source: Ambulatory Visit | Attending: Internal Medicine | Admitting: Internal Medicine

## 2012-03-07 ENCOUNTER — Other Ambulatory Visit (INDEPENDENT_AMBULATORY_CARE_PROVIDER_SITE_OTHER): Payer: PRIVATE HEALTH INSURANCE

## 2012-03-07 ENCOUNTER — Ambulatory Visit (INDEPENDENT_AMBULATORY_CARE_PROVIDER_SITE_OTHER): Payer: PRIVATE HEALTH INSURANCE | Admitting: Internal Medicine

## 2012-03-07 ENCOUNTER — Encounter: Payer: Self-pay | Admitting: Internal Medicine

## 2012-03-07 VITALS — BP 118/70 | HR 57 | Temp 98.3°F | Resp 10 | Wt 207.0 lb

## 2012-03-07 DIAGNOSIS — R933 Abnormal findings on diagnostic imaging of other parts of digestive tract: Secondary | ICD-10-CM

## 2012-03-07 DIAGNOSIS — Z860100 Personal history of colon polyps, unspecified: Secondary | ICD-10-CM | POA: Insufficient documentation

## 2012-03-07 DIAGNOSIS — I10 Essential (primary) hypertension: Secondary | ICD-10-CM

## 2012-03-07 DIAGNOSIS — R1031 Right lower quadrant pain: Secondary | ICD-10-CM

## 2012-03-07 LAB — CBC WITH DIFFERENTIAL/PLATELET
Basophils Absolute: 0 10*3/uL (ref 0.0–0.1)
Eosinophils Absolute: 0.2 10*3/uL (ref 0.0–0.7)
Lymphocytes Relative: 30.6 % (ref 12.0–46.0)
MCHC: 33.2 g/dL (ref 30.0–36.0)
Neutrophils Relative %: 57.7 % (ref 43.0–77.0)
Platelets: 202 10*3/uL (ref 150.0–400.0)
RBC: 4.85 Mil/uL (ref 4.22–5.81)
RDW: 13.9 % (ref 11.5–14.6)

## 2012-03-07 LAB — LDL CHOLESTEROL, DIRECT: Direct LDL: 134.8 mg/dL

## 2012-03-07 LAB — LIPID PANEL
HDL: 34.9 mg/dL — ABNORMAL LOW (ref >39.00)
VLDL: 41 mg/dL — ABNORMAL HIGH (ref 0.0–40.0)

## 2012-03-07 NOTE — Progress Notes (Signed)
  Subjective:    Patient ID: Justin Young, male    DOB: [birth year], 52 y.o.   MRN: [id number]  HPI Mr. 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 His 24 hr urine studies were stone cold normal. He has had a good response to beta-blocker. BP is very well controlled and he can feel the effect on anxiety.  He continues to have pain in the right groin. Reviewed CT images with patient - no hernia or other abnormality to explain pain. There was narrowing of the hip joint space. His pain is migratory and worse with driving.  CT did reveal increased atherosclerotic burde.mnrosb n - no lipid panel on file.   PMH, FamHx and SocHx reviewed for any changes and relevance.  Current Outpatient Prescriptions on File Prior to Visit  Medication Sig Dispense Refill  . diazepam (VALIUM) 10 MG tablet Take 10 mg by mouth at bedtime.       . metoprolol tartrate (LOPRESSOR) 25 MG tablet Take 1 tablet (25 mg total) by mouth 2 (two) times daily.  60 tablet  3   No current facility-administered medications on file prior to visit.      Review of Systems System review is negative for any constitutional, cardiac, pulmonary, GI or neuro symptoms or complaints other than as described in the HPI.      Objective:   Physical Exam Filed Vitals:   03/07/12 1452  BP: 118/70  Pulse: 57  Temp: 98.3 F (36.8 C)  Resp: 10   Gen'l - WNWD white man Cor- RRR Pulm - normal respirations MSK - normal gait and station  BILATERAL HIP WITH PELVIS - 4+ VIEW  Comparison: CT of 04/24/2011  Findings: AP view pelvis and AP/frog-leg views of bilateral hips.  Femoral heads are located. No acute fracture. Joint spaces  maintained.  IMPRESSION:  No acute osseous abnormality  Lab Results  Component Value Date   WBC 7.6 03/07/2012   HGB 14.3 03/07/2012   HCT 43.2 03/07/2012   PLT 202.0 03/07/2012   GLUCOSE 93 02/23/2012   CHOL 210* 03/07/2012   TRIG 205.0* 03/07/2012   HDL 34.90* 03/07/2012   LDLDIRECT 134.8 03/07/2012   ALT 20 02/23/2012   AST 19 02/23/2012   NA 142 02/23/2012   K 4.4 02/23/2012   CL 107 02/23/2012   CREATININE 0.8 02/23/2012   BUN 20 02/23/2012   CO2 28 02/23/2012   TSH 2.00 02/23/2012   HGBA1C 6.1 02/23/2012   .      Assessment & Plan:

## 2012-03-07 NOTE — Patient Instructions (Addendum)
No sign of a Pheo - a good thing  Blood pressure is much better. 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 Wendall Papa for gastroenterology  Atherosclerosis of the vessels as seen on CT scan - Plan Will check cholesterol levels  All results will be available on Mychart.

## 2012-03-08 DIAGNOSIS — Z86010 Personal history of colonic polyps: Secondary | ICD-10-CM | POA: Insufficient documentation

## 2012-03-08 DIAGNOSIS — R1031 Right lower quadrant pain: Secondary | ICD-10-CM | POA: Insufficient documentation

## 2012-03-08 NOTE — Assessment & Plan Note (Signed)
BP Readings from Last 3 Encounters:  03/07/12 118/70  02/23/12 122/86  01/27/12 155/105   Good control in office and by patient report out of the office on low dose Beta-blocker. He also notes a calming effect of the medication.  Plan Continue current regimen

## 2012-03-08 NOTE — Assessment & Plan Note (Signed)
Lipid panel with LDL just above goal of 130 or less  Plan Continue with a prudent diet.

## 2012-03-08 NOTE — Assessment & Plan Note (Signed)
Persistent for over a year. Constant with waxing and waning symptoms. Driving is an activity that exacerbates pain. Normal CT scan abdomen/pelvis April '13 - reviewed images and report with patient. The was hip joint space narrowing. Question of hip pain with radiation vs GI source, i.e. Colon.   Plan Bilateral hip films  GI consult  Addendum - normal hip films

## 2012-03-08 NOTE — Assessment & Plan Note (Signed)
Due for follow up colonoscopy. Also - investigate GI source of RLQ abdominal pain. He does wish to consolidate his care at [facility]  referral to Dr. Christella Hartigan.

## 2012-03-13 ENCOUNTER — Encounter: Payer: Self-pay | Admitting: Gastroenterology

## 2012-03-18 ENCOUNTER — Encounter: Payer: Self-pay | Admitting: Internal Medicine

## 2012-04-02 ENCOUNTER — Ambulatory Visit (AMBULATORY_SURGERY_CENTER): Payer: PRIVATE HEALTH INSURANCE | Admitting: *Deleted

## 2012-04-02 ENCOUNTER — Telehealth: Payer: Self-pay | Admitting: *Deleted

## 2012-04-02 VITALS — Ht 70.0 in | Wt 211.0 lb

## 2012-04-02 DIAGNOSIS — Z1211 Encounter for screening for malignant neoplasm of colon: Secondary | ICD-10-CM

## 2012-04-02 DIAGNOSIS — Z800 Family history of malignant neoplasm of digestive organs: Secondary | ICD-10-CM

## 2012-04-02 DIAGNOSIS — Z860100 Personal history of colon polyps, unspecified: Secondary | ICD-10-CM

## 2012-04-02 MED ORDER — NA SULFATE-K SULFATE-MG SULF 17.5-3.13-1.6 GM/177ML PO SOLN: ORAL | Status: DC

## 2012-04-02 NOTE — Telephone Encounter (Signed)
Patient here of direct screening colonoscopy on 04-16-12 with Dr.Jacobs. Patient states he had previous colonoscopy over 10 years ago at [facility]. Thinks with Dr.Johnson but unsure MD' s name. He believes he had polyps removed. Patient also has family history of colon cancer, his mother had ovarian and colon cancer. Patient was seen by Dr.Norins on 03-07-12 for RLQ pain(pain since last April), he also states he has passed BRB about 1 month ago. Release of information signed by patient and given to [facility].  This note sent to Dr.Jacobs also.

## 2012-04-02 NOTE — Telephone Encounter (Signed)
Can proceed with direct colonoscopy

## 2012-04-02 NOTE — Progress Notes (Signed)
Patient's last colonoscopy was over 10 years ago at [facility]. Had polyps removed. Release of information given to [facility]. Phone note sent to Dr.Jacobs also for FYI.

## 2012-04-02 NOTE — Telephone Encounter (Signed)
Pt has been notified to have the colon as scheduled and the release has been faxed.

## 2012-04-02 NOTE — Telephone Encounter (Signed)
Release has been faxed Dr Christella Hartigan do you want to leave the appt on until the records are received or schedule office visit?

## 2012-04-03 ENCOUNTER — Telehealth: Payer: Self-pay | Admitting: Gastroenterology

## 2012-04-03 NOTE — Telephone Encounter (Signed)
Patty, please call him.  Cancel his upcoming direct colonoscopy.  He had colonoscopy with Dr. Laural Benes at [facility] 11/2007.  Would not normally need repeat screening for five years from then (which is 7-8 months from now).  I'm happy to see him in office to discuss further if he wants, otherwise recall colonoscopy 11/2012  Colonoscopy Dr. Laural Benes 11/2007 for FH CRC (mother in her 54s): findings completely normal.

## 2012-04-03 NOTE — Telephone Encounter (Signed)
Left message on machine to call back  

## 2012-04-04 NOTE — Telephone Encounter (Signed)
Pt has been scheduled to see Dr Christella Hartigan in the office to discuss he says he had a colon 10 years ago not in 2009

## 2012-04-09 ENCOUNTER — Encounter: Payer: Self-pay | Admitting: Gastroenterology

## 2012-04-16 ENCOUNTER — Other Ambulatory Visit: Payer: PRIVATE HEALTH INSURANCE | Admitting: Gastroenterology

## 2012-04-18 ENCOUNTER — Telehealth: Payer: Self-pay | Admitting: Gastroenterology

## 2012-04-18 ENCOUNTER — Ambulatory Visit: Payer: PRIVATE HEALTH INSURANCE | Admitting: Gastroenterology

## 2012-04-18 NOTE — Telephone Encounter (Signed)
Message copied by Leanor Kail I on Wed Apr 18, 2012 11:00 AM ------      Message from: Donata Duff      Created: Wed Apr 18, 2012 10:47 AM       Do not bill ------

## 2012-05-16 ENCOUNTER — Telehealth: Payer: Self-pay | Admitting: Gastroenterology

## 2012-05-16 ENCOUNTER — Ambulatory Visit: Payer: PRIVATE HEALTH INSURANCE | Admitting: Gastroenterology

## 2012-05-16 NOTE — Telephone Encounter (Signed)
Message copied by Leanor Kail I on Wed May 16, 2012  9:24 AM ------      Message from: Donata Duff      Created: Wed May 16, 2012  9:22 AM       Do not bill  ------

## 2012-06-20 ENCOUNTER — Other Ambulatory Visit: Payer: Self-pay | Admitting: Internal Medicine

## 2012-10-27 ENCOUNTER — Other Ambulatory Visit: Payer: Self-pay | Admitting: Internal Medicine

## 2013-03-26 ENCOUNTER — Encounter: Payer: Self-pay | Admitting: Gastroenterology

## 2013-04-24 ENCOUNTER — Ambulatory Visit (AMBULATORY_SURGERY_CENTER): Payer: Self-pay

## 2013-04-24 VITALS — Ht 70.0 in | Wt 218.0 lb

## 2013-04-24 DIAGNOSIS — Z860100 Personal history of colon polyps, unspecified: Secondary | ICD-10-CM

## 2013-04-24 MED ORDER — MOVIPREP 100 G PO SOLR: 1.0000 | Freq: Once | ORAL | Status: DC

## 2013-04-30 ENCOUNTER — Encounter: Payer: Self-pay | Admitting: Gastroenterology

## 2013-04-30 ENCOUNTER — Other Ambulatory Visit: Payer: Self-pay

## 2013-04-30 MED ORDER — METOPROLOL TARTRATE 25 MG PO TABS: 25.0000 mg | ORAL_TABLET | Freq: Two times a day (BID) | ORAL | Status: DC

## 2013-05-08 ENCOUNTER — Encounter: Payer: Self-pay | Admitting: Gastroenterology

## 2013-05-08 ENCOUNTER — Ambulatory Visit (AMBULATORY_SURGERY_CENTER): Payer: Self-pay | Admitting: Gastroenterology

## 2013-05-08 VITALS — BP 144/89 | HR 50 | Temp 97.1°F | Resp 20 | Ht 70.0 in | Wt 218.0 lb

## 2013-05-08 DIAGNOSIS — K5730 Diverticulosis of large intestine without perforation or abscess without bleeding: Secondary | ICD-10-CM

## 2013-05-08 DIAGNOSIS — Z86010 Personal history of colonic polyps: Secondary | ICD-10-CM

## 2013-05-08 DIAGNOSIS — Z800 Family history of malignant neoplasm of digestive organs: Secondary | ICD-10-CM

## 2013-05-08 MED ORDER — SODIUM CHLORIDE 0.9 % IV SOLN: 500.0000 mL | INTRAVENOUS | Status: DC

## 2013-05-08 NOTE — Patient Instructions (Signed)
YOU HAD AN ENDOSCOPIC PROCEDURE TODAY AT THE [facility]: Refer to the procedure report that was given to you for any specific questions about what was found during the examination.  If the procedure report does not answer your questions, please call your gastroenterologist to clarify.  If you requested that your care partner not be given the details of your procedure findings, then the procedure report has been included in a sealed envelope for you to review at your convenience later.  YOU SHOULD EXPECT: Some feelings of bloating in the abdomen. Passage of more gas than usual.  Walking can help get rid of the air that was put into your GI tract during the procedure and reduce the bloating. If you had a lower endoscopy (such as a colonoscopy or flexible sigmoidoscopy) you may notice spotting of blood in your stool or on the toilet paper. If you underwent a bowel prep for your procedure, then you may not have a normal bowel movement for a few days.  DIET: Your first meal following the procedure should be a light meal and then it is ok to progress to your normal diet.  A half-sandwich or bowl of soup is an example of a good first meal.  Heavy or fried foods are harder to digest and may make you feel nauseous or bloated.  Likewise meals heavy in dairy and vegetables can cause extra gas to form and this can also increase the bloating.  Drink plenty of fluids but you should avoid alcoholic beverages for 24 hours.  ACTIVITY: Your care partner should take you home directly after the procedure.  You should plan to take it easy, moving slowly for the rest of the day.  You can resume normal activity the day after the procedure however you should NOT DRIVE or use heavy machinery for 24 hours (because of the sedation medicines used during the test).    SYMPTOMS TO REPORT IMMEDIATELY: A gastroenterologist can be reached at any hour.  During normal business hours, 8:30 AM to 5:00 PM Monday through Friday,  call [phone].  After hours and on weekends, please call the GI answering service at [phone] who will take a message and have the physician on call contact you.   Following lower endoscopy (colonoscopy or flexible sigmoidoscopy):  Excessive amounts of blood in the stool  Significant tenderness or worsening of abdominal pains  Swelling of the abdomen that is new, acute  Fever of 100F or higher  FOLLOW UP: If any biopsies were taken you will be contacted by phone or by letter within the next 1-3 weeks.  Call your gastroenterologist if you have not heard about the biopsies in 3 weeks.  Our staff will call the home number listed on your records the next business day following your procedure to check on you and address any questions or concerns that you may have at that time regarding the information given to you following your procedure. This is a courtesy call and so if there is no answer at the home number and we have not heard from you through the emergency physician on call, we will assume that you have returned to your regular daily activities without incident.  SIGNATURES/CONFIDENTIALITY: You and/or your care partner have signed paperwork which will be entered into your electronic medical record.  These signatures attest to the fact that that the information above on your After Visit Summary has been reviewed and is understood.  Full responsibility of the confidentiality of this   discharge information lies with you and/or your care-partner.  Resume medications. Information given on diverticulosis and high fiber diet with discharge instructions. 

## 2013-05-08 NOTE — Op Note (Signed)
 [facility] [address] [place], [id number]   COLONOSCOPY PROCEDURE REPORT  PATIENT: Justin Young, Justin J.  MR#: [id number] BIRTHDATE: [birth year] , 52  yrs. old GENDER: Male ENDOSCOPIST: Rachael Feeaniel P Lewin Pellow, MD REFERRED ZD:GLOVFIEBY:Michael Esther HardyE Norins, M.D. PROCEDURE DATE:  05/08/2013 PROCEDURE:   Colonoscopy, screening First Screening Colonoscopy - Avg.  risk and is 50 yrs.  old or older - No.  Prior Negative Screening - Now for repeat screening. Above average risk  History of Adenoma - Now for follow-up colonoscopy & has been > or = to 3 yrs.  N/A  Polyps Removed Today? No.  Recommend repeat exam, <10 yrs? Yes.  High risk (family or personal hx). ASA CLASS:   Class II INDICATIONS:elevated risk screening, mother had colon cancer in her 440s, colonoscopy 2009 Dr. Laural BenesJohnson found no polyps MEDICATIONS: MAC sedation, administered by CRNA and propofol (Diprivan) 250mg  IV  DESCRIPTION OF PROCEDURE:   After the risks benefits and alternatives of the procedure were thoroughly explained, informed consent was obtained.  A digital rectal exam revealed no abnormalities of the rectum.   The LB [id number]  endoscope was introduced through the anus and advanced to the cecum, which was identified by both the appendix and ileocecal valve. No adverse events experienced.   The quality of the prep was excellent.  The instrument was then slowly withdrawn as the colon was fully examined.   COLON FINDINGS: There were a few small diverticulum in the left colon.  The examination was otherwise normal.  Retroflexed views revealed no abnormalities. The time to cecum=1 minutes 35 seconds. Withdrawal time=6 minutes 00 seconds.  The scope was withdrawn and the procedure completed. COMPLICATIONS: There were no complications.  ENDOSCOPIC IMPRESSION: There were a few small diverticulum in the left colon. The examination was otherwise normal.  No polyps or cancers.  RECOMMENDATIONS: Given your  significant family history of colon cancer, you should have a repeat colonoscopy in 5 years   eSigned:  Rachael Feeaniel P Makylah Bossard, MD 05/08/2013 1:38 PM

## 2013-05-08 NOTE — Progress Notes (Signed)
Lidocaine-40mg IV prior to Propofol InductionPropofol given over incremental dosages 

## 2013-05-09 ENCOUNTER — Telehealth: Payer: Self-pay | Admitting: *Deleted

## 2013-05-09 NOTE — Telephone Encounter (Signed)
  Follow up Call-  Call back number 05/08/2013  Post procedure Call Back phone  # [phone]  Permission to leave phone message Yes     Left message on answering machine to give us a call back if experiencing any problems or has any questions.

## 2013-06-02 ENCOUNTER — Other Ambulatory Visit: Payer: Self-pay | Admitting: Internal Medicine

## 2013-06-11 ENCOUNTER — Telehealth: Payer: Self-pay | Admitting: Internal Medicine

## 2013-06-11 NOTE — Telephone Encounter (Signed)
Rec'd from [facility] forward 4 pages to Dr. Debby Bud

## 2013-07-06 ENCOUNTER — Other Ambulatory Visit: Payer: Self-pay | Admitting: Internal Medicine

## 2013-10-18 ENCOUNTER — Other Ambulatory Visit: Payer: Self-pay | Admitting: Geriatric Medicine

## 2013-10-18 MED ORDER — METOPROLOL TARTRATE 25 MG PO TABS: ORAL_TABLET | ORAL | Status: DC

## 2013-10-24 ENCOUNTER — Other Ambulatory Visit: Payer: Self-pay

## 2014-01-08 ENCOUNTER — Other Ambulatory Visit: Payer: Self-pay | Admitting: Geriatric Medicine

## 2014-01-08 MED ORDER — METOPROLOL TARTRATE 25 MG PO TABS: ORAL_TABLET | ORAL | Status: DC

## 2014-02-06 ENCOUNTER — Ambulatory Visit: Payer: Self-pay | Admitting: Internal Medicine

## 2014-02-10 ENCOUNTER — Telehealth: Payer: Self-pay | Admitting: Internal Medicine

## 2014-02-10 ENCOUNTER — Ambulatory Visit: Payer: Self-pay | Admitting: Internal Medicine

## 2014-02-10 NOTE — Telephone Encounter (Signed)
Patient was a no show. Please advise.  °

## 2014-02-11 NOTE — Telephone Encounter (Signed)
Would you like this patient to be rescheduled? Please advise, thanks.

## 2014-02-12 NOTE — Telephone Encounter (Signed)
If he calls to reschedule he can but no more refills on his medications will be authorized as he has missed his appointment.

## 2014-02-12 NOTE — Telephone Encounter (Signed)
Patient can reschedule if he would like.

## 2014-08-26 ENCOUNTER — Ambulatory Visit: Payer: Self-pay | Admitting: Internal Medicine

## 2014-09-09 ENCOUNTER — Ambulatory Visit (INDEPENDENT_AMBULATORY_CARE_PROVIDER_SITE_OTHER): Payer: Self-pay | Admitting: Internal Medicine

## 2014-09-09 ENCOUNTER — Ambulatory Visit (INDEPENDENT_AMBULATORY_CARE_PROVIDER_SITE_OTHER)
Admission: RE | Admit: 2014-09-09 | Discharge: 2014-09-09 | Disposition: A | Discharge status: Home / Self Care | Payer: Self-pay | Source: Ambulatory Visit | Attending: Internal Medicine | Admitting: Internal Medicine

## 2014-09-09 ENCOUNTER — Encounter: Payer: Self-pay | Admitting: Internal Medicine

## 2014-09-09 ENCOUNTER — Other Ambulatory Visit (INDEPENDENT_AMBULATORY_CARE_PROVIDER_SITE_OTHER): Payer: Self-pay

## 2014-09-09 VITALS — BP 138/85 | HR 55 | Temp 98.4°F | Resp 18 | Ht 70.5 in | Wt 225.8 lb

## 2014-09-09 DIAGNOSIS — R0781 Pleurodynia: Secondary | ICD-10-CM

## 2014-09-09 DIAGNOSIS — Z0000 Encounter for general adult medical examination without abnormal findings: Secondary | ICD-10-CM

## 2014-09-09 DIAGNOSIS — I10 Essential (primary) hypertension: Secondary | ICD-10-CM

## 2014-09-09 LAB — LIPID PANEL
CHOL/HDL RATIO: 4
CHOLESTEROL: 179 mg/dL (ref 0–200)
HDL: 40.1 mg/dL (ref >39.00)
LDL Cholesterol: 100 mg/dL — ABNORMAL HIGH (ref 0–99)
NonHDL: 138.56
TRIGLYCERIDES: 191 mg/dL — AB (ref 0.0–149.0)
VLDL: 38.2 mg/dL (ref 0.0–40.0)

## 2014-09-09 LAB — COMPREHENSIVE METABOLIC PANEL
ALBUMIN: 4 g/dL (ref 3.5–5.2)
ALT: 16 U/L (ref 0–53)
AST: 17 U/L (ref 0–37)
Alkaline Phosphatase: 65 U/L (ref 39–117)
BILIRUBIN TOTAL: 0.2 mg/dL (ref 0.2–1.2)
BUN: 18 mg/dL (ref 6–23)
CALCIUM: 9.1 mg/dL (ref 8.4–10.5)
CHLORIDE: 104 meq/L (ref 96–112)
CO2: 29 meq/L (ref 19–32)
Creatinine, Ser: 0.74 mg/dL (ref 0.40–1.50)
GFR: 117.54 mL/min (ref >60.00)
Glucose, Bld: 97 mg/dL (ref 70–99)
Potassium: 4 mEq/L (ref 3.5–5.1)
Sodium: 139 mEq/L (ref 135–145)
Total Protein: 6.4 g/dL (ref 6.0–8.3)

## 2014-09-09 LAB — HEMOGLOBIN A1C: Hgb A1c MFr Bld: 5.9 % (ref 4.6–6.5)

## 2014-09-09 MED ORDER — METOPROLOL TARTRATE 25 MG PO TABS: ORAL_TABLET | ORAL | Status: DC

## 2014-09-09 NOTE — Assessment & Plan Note (Signed)
Checking labs, non-smoker. Talked to him about exercise and working on losing some pounds. BP at goal.

## 2014-09-09 NOTE — Assessment & Plan Note (Signed)
Checking x-ray of the ribs on the right. Using NSAIDs for pain and got 6 hydrocodone from the urgent care which he still has 4 and denies need for more.

## 2014-09-09 NOTE — Assessment & Plan Note (Signed)
Refill lopressor 25 mg BID. HR 55 is okay. Checking labs.

## 2014-09-09 NOTE — Progress Notes (Signed)
   Subjective:    Patient ID: Justin Young, male    DOB: 1960-03-06, 54 y.o.   MRN: [id number]  HPI The patient is a 54 YO man coming in for wellness. New concern with rib injury about 2 weeks ago when heading back from the beach. Still sore but taking motrin for pain. No SOB or cough.   PMH, [facility], social history reviewed and updated.   Review of Systems  Constitutional: Negative for fever, activity change, appetite change, fatigue and unexpected weight change.  HENT: Negative.   Eyes: Negative.   Respiratory: Negative for cough, chest tightness, shortness of breath and wheezing.   Cardiovascular: Positive for chest pain. Negative for palpitations and leg swelling.  Gastrointestinal: Negative for nausea, abdominal pain, diarrhea, constipation, blood in stool and abdominal distention.  Musculoskeletal: Positive for myalgias and arthralgias. Negative for back pain and gait problem.  Skin: Negative.   Neurological: Negative.   Psychiatric/Behavioral: Negative.       Objective:   Physical Exam  Constitutional: He is oriented to person, place, and time. He appears well-developed and well-nourished.  HENT:  Head: Normocephalic and atraumatic.  Eyes: EOM are normal.  Neck: Normal range of motion.  Cardiovascular: Normal rate and regular rhythm.   Pulmonary/Chest: Effort normal and breath sounds normal. No respiratory distress. He has no wheezes. He has no rales. He exhibits tenderness.  Soreness over the ribs medially right.  Abdominal: Soft. Bowel sounds are normal. He exhibits no distension. There is no tenderness. There is no rebound.  Musculoskeletal: He exhibits no edema.  Neurological: He is alert and oriented to person, place, and time. Coordination normal.  Skin: Skin is warm and dry.  Psychiatric: He has a normal mood and affect.   Filed Vitals:   09/09/14 1110  BP: 148/92  Pulse: 55  Temp: 98.4 F (36.9 C)  TempSrc: Oral  Resp: 18  Height: 5' 10.5" (1.791 m)  Weight:  225 lb 12.8 oz (102.422 kg)  SpO2: 97%      Assessment & Plan:

## 2014-09-09 NOTE — Patient Instructions (Signed)
We will check the x-ray of the ribs and call you with the results. We will also check some blood work and call you back with those results.   We have sent in the refill of the metoprolol.  If things are going well we can see you back in about 1 year or so.   Health Maintenance A healthy lifestyle and preventative care can promote health and wellness.  Maintain regular health, dental, and eye exams.  Eat a healthy diet. Foods like vegetables, fruits, whole grains, low-fat dairy products, and lean protein foods contain the nutrients you need and are low in calories. Decrease your intake of foods high in solid fats, added sugars, and salt. Get information about a proper diet from your health care provider, if necessary.  Regular physical exercise is one of the most important things you can do for your health. Most adults should get at least 150 minutes of moderate-intensity exercise (any activity that increases your heart rate and causes you to sweat) each week. In addition, most adults need muscle-strengthening exercises on 2 or more days a week.   Maintain a healthy weight. The body mass index (BMI) is a screening tool to identify possible weight problems. It provides an estimate of body fat based on height and weight. Your health care provider can find your BMI and can help you achieve or maintain a healthy weight. For males 20 years and older:  A BMI below 18.5 is considered underweight.  A BMI of 18.5 to 24.9 is normal.  A BMI of 25 to 29.9 is considered overweight.  A BMI of 30 and above is considered obese.  Maintain normal blood lipids and cholesterol by exercising and minimizing your intake of saturated fat. Eat a balanced diet with plenty of fruits and vegetables. Blood tests for lipids and cholesterol should begin at age 49 and be repeated every 5 years. If your lipid or cholesterol levels are high, you are over age 91, or you are at high risk for heart disease, you may need your  cholesterol levels checked more frequently.Ongoing high lipid and cholesterol levels should be treated with medicines if diet and exercise are not working.  If you smoke, find out from your health care provider how to quit. If you do not use tobacco, do not start.  Lung cancer screening is recommended for adults aged 55-80 years who are at high risk for developing lung cancer because of a history of smoking. A yearly low-dose CT scan of the lungs is recommended for people who have at least a 30-pack-year history of smoking and are current smokers or have quit within the past 15 years. A pack year of smoking is smoking an average of 1 pack of cigarettes a day for 1 year (for example, a 30-pack-year history of smoking could mean smoking 1 pack a day for 30 years or 2 packs a day for 15 years). Yearly screening should continue until the smoker has stopped smoking for at least 15 years. Yearly screening should be stopped for people who develop a health problem that would prevent them from having lung cancer treatment.  If you choose to drink alcohol, do not have more than 2 drinks per day. One drink is considered to be 12 oz (360 mL) of beer, 5 oz (150 mL) of wine, or 1.5 oz (45 mL) of liquor.  Avoid the use of street drugs. Do not share needles with anyone. Ask for help if you need support or instructions about  stopping the use of drugs.  High blood pressure causes heart disease and increases the risk of stroke. Blood pressure should be checked at least every 1-2 years. Ongoing high blood pressure should be treated with medicines if weight loss and exercise are not effective.  If you are 15-42 years old, ask your health care provider if you should take aspirin to prevent heart disease.  Diabetes screening involves taking a blood sample to check your fasting blood sugar level. This should be done once every 3 years after age 23 if you are at a normal weight and without risk factors for diabetes. Testing  should be considered at a younger age or be carried out more frequently if you are overweight and have at least 1 risk factor for diabetes.  Colorectal cancer can be detected and often prevented. Most routine colorectal cancer screening begins at the age of 56 and continues through age 85. However, your health care provider may recommend screening at an earlier age if you have risk factors for colon cancer. On a yearly basis, your health care provider may provide home test kits to check for hidden blood in the stool. A small camera at the end of a tube may be used to directly examine the colon (sigmoidoscopy or colonoscopy) to detect the earliest forms of colorectal cancer. Talk to your health care provider about this at age 37 when routine screening begins. A direct exam of the colon should be repeated every 5-10 years through age 61, unless early forms of precancerous polyps or small growths are found.  People who are at an increased risk for hepatitis B should be screened for this virus. You are considered at high risk for hepatitis B if:  You were born in a country where hepatitis B occurs often. Talk with your health care provider about which countries are considered high risk.  Your parents were born in a high-risk country and you have not received a shot to protect against hepatitis B (hepatitis B vaccine).  You have HIV or AIDS.  You use needles to inject street drugs.  You live with, or have sex with, someone who has hepatitis B.  You are a man who has sex with other men (MSM).  You get hemodialysis treatment.  You take certain medicines for conditions like cancer, organ transplantation, and autoimmune conditions.  Hepatitis C blood testing is recommended for all people born from [birth year] through 1964 and any individual with known risk factors for hepatitis C.  Healthy men should no longer receive prostate-specific antigen (PSA) blood tests as part of routine cancer screening. Talk to  your health care provider about prostate cancer screening.  Testicular cancer screening is not recommended for adolescents or adult males who have no symptoms. Screening includes self-exam, a health care provider exam, and other screening tests. Consult with your health care provider about any symptoms you have or any concerns you have about testicular cancer.  Practice safe sex. Use condoms and avoid high-risk sexual practices to reduce the spread of sexually transmitted infections (STIs).  You should be screened for STIs, including gonorrhea and chlamydia if:  You are sexually active and are younger than 24 years.  You are older than 24 years, and your health care provider tells you that you are at risk for this type of infection.  Your sexual activity has changed since you were last screened, and you are at an increased risk for chlamydia or gonorrhea. Ask your health care provider if you are  at risk.  If you are at risk of being infected with HIV, it is recommended that you take a prescription medicine daily to prevent HIV infection. This is called pre-exposure prophylaxis (PrEP). You are considered at risk if:  You are a man who has sex with other men (MSM).  You are a heterosexual man who is sexually active with multiple partners.  You take drugs by injection.  You are sexually active with a partner who has HIV.  Talk with your health care provider about whether you are at high risk of being infected with HIV. If you choose to begin PrEP, you should first be tested for HIV. You should then be tested every 3 months for as long as you are taking PrEP.  Use sunscreen. Apply sunscreen liberally and repeatedly throughout the day. You should seek shade when your shadow is shorter than you. Protect yourself by wearing long sleeves, pants, a wide-brimmed hat, and sunglasses year round whenever you are outdoors.  Tell your health care provider of new moles or changes in moles, especially if  there is a change in shape or color. Also, tell your health care provider if a mole is larger than the size of a pencil eraser.  A one-time screening for abdominal aortic aneurysm (AAA) and surgical repair of large AAAs by ultrasound is recommended for men aged 65-75 years who are current or former smokers.  Stay current with your vaccines (immunizations). Document Released: 07/02/2007 Document Revised: 01/08/2013 Document Reviewed: 05/31/2010 [facility] Patient Information 2015 [place], [place]. This information is not intended to replace advice given to you by your health care provider. Make sure you discuss any questions you have with your health care provider.

## 2014-09-09 NOTE — Progress Notes (Signed)
Pre visit review using our clinic review tool, if applicable. No additional management support is needed unless otherwise documented below in the visit note. 

## 2015-05-28 DIAGNOSIS — H6980 Other specified disorders of Eustachian tube, unspecified ear: Secondary | ICD-10-CM | POA: Insufficient documentation

## 2015-05-28 DIAGNOSIS — H9190 Unspecified hearing loss, unspecified ear: Secondary | ICD-10-CM | POA: Insufficient documentation

## 2015-07-24 DIAGNOSIS — B349 Viral infection, unspecified: Secondary | ICD-10-CM | POA: Insufficient documentation

## 2015-07-24 DIAGNOSIS — W57XXXA Bitten or stung by nonvenomous insect and other nonvenomous arthropods, initial encounter: Secondary | ICD-10-CM | POA: Insufficient documentation

## 2016-02-01 ENCOUNTER — Other Ambulatory Visit: Payer: Self-pay | Admitting: Orthopedic Surgery

## 2016-02-01 DIAGNOSIS — M5441 Lumbago with sciatica, right side: Secondary | ICD-10-CM

## 2016-02-09 ENCOUNTER — Other Ambulatory Visit: Payer: Self-pay | Admitting: Orthopedic Surgery

## 2016-02-09 ENCOUNTER — Ambulatory Visit
Admission: RE | Admit: 2016-02-09 | Discharge: 2016-02-09 | Disposition: A | Discharge status: Home / Self Care | Payer: Self-pay | Source: Ambulatory Visit | Attending: Orthopedic Surgery | Admitting: Orthopedic Surgery

## 2016-02-09 ENCOUNTER — Ambulatory Visit
Admission: RE | Admit: 2016-02-09 | Discharge: 2016-02-09 | Disposition: A | Discharge status: Home / Self Care | Payer: BLUE CROSS/BLUE SHIELD | Source: Ambulatory Visit | Attending: Orthopedic Surgery | Admitting: Orthopedic Surgery

## 2016-02-09 DIAGNOSIS — M5441 Lumbago with sciatica, right side: Secondary | ICD-10-CM

## 2016-02-09 MED ORDER — DIAZEPAM 5 MG PO TABS
10.0000 mg | ORAL_TABLET | Freq: Once | ORAL | Status: AC
  Administered 2016-02-09: 10 mg via ORAL

## 2016-02-09 MED ORDER — IOPAMIDOL (ISOVUE-M 200) INJECTION 41%
15.0000 mL | Freq: Once | INTRAMUSCULAR | Status: AC
  Administered 2016-02-09: 15 mL via INTRATHECAL

## 2016-02-09 MED ORDER — MEPERIDINE HCL 100 MG/ML IJ SOLN
75.0000 mg | Freq: Once | INTRAMUSCULAR | Status: AC
  Administered 2016-02-09: 75 mg via INTRAMUSCULAR

## 2016-02-09 MED ORDER — ONDANSETRON HCL 4 MG/2ML IJ SOLN
4.0000 mg | Freq: Once | INTRAMUSCULAR | Status: AC
  Administered 2016-02-09: 4 mg via INTRAMUSCULAR

## 2016-02-09 MED ORDER — ONDANSETRON HCL 4 MG/2ML IJ SOLN: 4.0000 mg | Freq: Four times a day (QID) | INTRAMUSCULAR | Status: DC | PRN

## 2016-02-09 NOTE — Discharge Instructions (Signed)

## 2016-03-08 ENCOUNTER — Other Ambulatory Visit (HOSPITAL_COMMUNITY): Payer: Self-pay | Admitting: *Deleted

## 2016-03-08 NOTE — Patient Instructions (Signed)
Justin Young  03/08/2016   Your procedure is scheduled on: 03-16-16  [facility]  [facility] 3rd floor to  Short Stay Center at  830 AM.  Call this number if you have problems the morning of surgery [phone]   Remember: ONLY 1 PERSON MAY GO WITH YOU TO SHORT STAY TO GET  READY MORNING OF YOUR SURGERY.  Do not eat food or drink liquids :After Midnight.     Take these medicines the morning of surgery with A SIP OF WATER: AMLODIPINE (NORVASC), LORATATDINE (CLARITIN), METOPROLOL (LOPRESSOR)                               You may not have any metal on your body including hair pins and              piercings  Do not wear jewelry, make-up, lotions, powders or perfumes, deodorant             Do not wear nail polish.  Do not shave  48 hours prior to surgery.              Men may shave face and neck.   Do not bring valuables to the hospital. [facility] IS NOT             RESPONSIBLE   FOR VALUABLES.  Contacts, dentures or bridgework may not be worn into surgery.  Leave suitcase in the car. After surgery it may be brought to your room.                  Please read over the following fact sheets you were given: _____________________________________________________________________             [facility] - Preparing for Surgery Before surgery, you can play an important role.  Because skin is not sterile, your skin needs to be as free of germs as possible.  You can reduce the number of germs on your skin by washing with CHG (chlorahexidine gluconate) soap before surgery.  CHG is an antiseptic cleaner which kills germs and bonds with the skin to continue killing germs even after washing. Please DO NOT use if you have an allergy to CHG or antibacterial soaps.  If your skin becomes reddened/irritated stop using the CHG and inform your nurse when you arrive at Short Stay. Do not shave (including legs and underarms) for at least 48  hours prior to the first CHG shower.  You may shave your face/neck. Please follow these instructions carefully:  1.  Shower with CHG Soap the night before surgery and the  morning of Surgery.  2.  If you choose to wash your hair, wash your hair first as usual with your  normal  shampoo.  3.  After you shampoo, rinse your hair and body thoroughly to remove the  shampoo.                           4.  Use CHG as you would any other liquid soap.  You can apply chg directly  to the skin and wash                       Gently with a scrungie or clean washcloth.  5.  Apply the CHG Soap to your body ONLY FROM THE NECK DOWN.   Do not use on face/ open                           Wound or open sores. Avoid contact with eyes, ears mouth and genitals (private parts).                       Wash face,  Genitals (private parts) with your normal soap.             6.  Wash thoroughly, paying special attention to the area where your surgery  will be performed.  7.  Thoroughly rinse your body with warm water from the neck down.  8.  DO NOT shower/wash with your normal soap after using and rinsing off  the CHG Soap.                9.  Pat yourself dry with a clean towel.            10.  Wear clean pajamas.            11.  Place clean sheets on your bed the night of your first shower and do not  sleep with pets. Day of Surgery : Do not apply any lotions/deodorants the morning of surgery.  Please wear clean clothes to the hospital/surgery center.  FAILURE TO FOLLOW THESE INSTRUCTIONS MAY RESULT IN THE CANCELLATION OF YOUR SURGERY PATIENT SIGNATURE_________________________________  NURSE SIGNATURE__________________________________  ________________________________________________________________________   [name]  An incentive spirometer is a tool that can help keep your lungs clear and active. This tool measures how well you are filling your lungs with each breath. Taking long deep breaths may help  reverse or decrease the chance of developing breathing (pulmonary) problems (especially infection) following:  A long period of time when you are unable to move or be active. BEFORE THE PROCEDURE   If the spirometer includes an indicator to show your best effort, your nurse or respiratory therapist will set it to a desired goal.  If possible, sit up straight or lean slightly forward. Try not to slouch.  Hold the incentive spirometer in an upright position. INSTRUCTIONS FOR USE  1. Sit on the edge of your bed if possible, or sit up as far as you can in bed or on a chair. 2. Hold the incentive spirometer in an upright position. 3. Breathe out normally. 4. Place the mouthpiece in your mouth and seal your lips tightly around it. 5. Breathe in slowly and as deeply as possible, raising the piston or the ball toward the top of the column. 6. Hold your breath for 3-5 seconds or for as long as possible. Allow the piston or ball to fall to the bottom of the column. 7. Remove the mouthpiece from your mouth and breathe out normally. 8. Rest for a few seconds and repeat Steps 1 through 7 at least 10 times every 1-2 hours when you are awake. Take your time and take a few normal breaths between deep breaths. 9. The spirometer may include an indicator to show your best effort. Use the indicator as a goal to work toward during each repetition. 10. After each set of 10 deep breaths, practice coughing to be sure your lungs are clear. If you have an incision (the cut made at the time of surgery), support your incision when coughing by placing a pillow or  rolled up towels firmly against it. Once you are able to get out of bed, walk around indoors and cough well. You may stop using the incentive spirometer when instructed by your caregiver.  RISKS AND COMPLICATIONS  Take your time so you do not get dizzy or light-headed.  If you are in pain, you may need to take or ask for pain medication before doing incentive  spirometry. It is harder to take a deep breath if you are having pain. AFTER USE  Rest and breathe slowly and easily.  It can be helpful to keep track of a log of your progress. Your caregiver can provide you with a simple table to help with this. If you are using the spirometer at home, follow these instructions: [place] IF:   You are having difficultly using the spirometer.  You have trouble using the spirometer as often as instructed.  Your pain medication is not giving enough relief while using the spirometer.  You develop fever of 100.5 F (38.1 C) or higher. SEEK IMMEDIATE MEDICAL CARE IF:   You cough up bloody sputum that had not been present before.  You develop fever of 102 F (38.9 C) or greater.  You develop worsening pain at or near the incision site. MAKE SURE YOU:   Understand these instructions.  Will watch your condition.  Will get help right away if you are not doing well or get worse. Document Released: 05/16/2006 Document Revised: 03/28/2011 Document Reviewed: 07/17/2006 [employer] Patient Information 2014 [employer], [place].   ________________________________________________________________________  WHAT IS A BLOOD TRANSFUSION? Blood Transfusion Information  A transfusion is the replacement of blood or some of its parts. Blood is made up of multiple cells which provide different functions.  Red blood cells carry oxygen and are used for blood loss replacement.  White blood cells fight against infection.  Platelets control bleeding.  Plasma helps clot blood.  Other blood products are available for specialized needs, such as hemophilia or other clotting disorders. BEFORE THE TRANSFUSION  Who gives blood for transfusions?   Healthy volunteers who are fully evaluated to make sure their blood is safe. This is blood bank blood. Transfusion therapy is the safest it has ever been in the practice of medicine. Before blood is taken from a donor, a  complete history is taken to make sure that person has no history of diseases nor engages in risky social behavior (examples are intravenous drug use or sexual activity with multiple partners). The donor's travel history is screened to minimize risk of transmitting infections, such as malaria. The donated blood is tested for signs of infectious diseases, such as HIV and hepatitis. The blood is then tested to be sure it is compatible with you in order to minimize the chance of a transfusion reaction. If you or a relative donates blood, this is often done in anticipation of surgery and is not appropriate for emergency situations. It takes many days to process the donated blood. RISKS AND COMPLICATIONS Although transfusion therapy is very safe and saves many lives, the main dangers of transfusion include:   Getting an infectious disease.  Developing a transfusion reaction. This is an allergic reaction to something in the blood you were given. Every precaution is taken to prevent this. The decision to have a blood transfusion has been considered carefully by your caregiver before blood is given. Blood is not given unless the benefits outweigh the risks. AFTER THE TRANSFUSION  Right after receiving a blood transfusion, you will usually  feel much better and more energetic. This is especially true if your red blood cells have gotten low (anemic). The transfusion raises the level of the red blood cells which carry oxygen, and this usually causes an energy increase.  The nurse administering the transfusion will monitor you carefully for complications. HOME CARE INSTRUCTIONS  No special instructions are needed after a transfusion. You may find your energy is better. Speak with your caregiver about any limitations on activity for underlying diseases you may have. SEEK MEDICAL CARE IF:   Your condition is not improving after your transfusion.  You develop redness or irritation at the intravenous (IV)  site. SEEK IMMEDIATE MEDICAL CARE IF:  Any of the following symptoms occur over the next 12 hours:  Shaking chills.  You have a temperature by mouth above 102 F (38.9 C), not controlled by medicine.  Chest, back, or muscle pain.  People around you feel you are not acting correctly or are confused.  Shortness of breath or difficulty breathing.  Dizziness and fainting.  You get a rash or develop hives.  You have a decrease in urine output.  Your urine turns a dark color or changes to pink, red, or brown. Any of the following symptoms occur over the next 10 days:  You have a temperature by mouth above 102 F (38.9 C), not controlled by medicine.  Shortness of breath.  Weakness after normal activity.  The white part of the eye turns yellow (jaundice).  You have a decrease in the amount of urine or are urinating less often.  Your urine turns a dark color or changes to pink, red, or brown. Document Released: 01/01/2000 Document Revised: 03/28/2011 Document Reviewed: 08/20/2007 [facility] Patient Information 2014 [place], [place].  _______________________________________________________________________

## 2016-03-09 ENCOUNTER — Ambulatory Visit (HOSPITAL_COMMUNITY)
Admission: RE | Admit: 2016-03-09 | Discharge: 2016-03-09 | Disposition: A | Discharge status: Home / Self Care | Payer: BLUE CROSS/BLUE SHIELD | Source: Ambulatory Visit | Attending: Surgical | Admitting: Surgical

## 2016-03-09 ENCOUNTER — Encounter (HOSPITAL_COMMUNITY): Payer: Self-pay

## 2016-03-09 ENCOUNTER — Encounter (HOSPITAL_COMMUNITY)
Admission: RE | Admit: 2016-03-09 | Discharge: 2016-03-09 | Disposition: A | Discharge status: Home / Self Care | Payer: BLUE CROSS/BLUE SHIELD | Source: Ambulatory Visit | Attending: Orthopedic Surgery | Admitting: Orthopedic Surgery

## 2016-03-09 DIAGNOSIS — M4316 Spondylolisthesis, lumbar region: Secondary | ICD-10-CM | POA: Insufficient documentation

## 2016-03-09 DIAGNOSIS — M545 Low back pain, unspecified: Secondary | ICD-10-CM

## 2016-03-09 DIAGNOSIS — Z87891 Personal history of nicotine dependence: Secondary | ICD-10-CM | POA: Diagnosis not present

## 2016-03-09 DIAGNOSIS — I700 Atherosclerosis of aorta: Secondary | ICD-10-CM | POA: Diagnosis not present

## 2016-03-09 DIAGNOSIS — Z01818 Encounter for other preprocedural examination: Secondary | ICD-10-CM | POA: Insufficient documentation

## 2016-03-09 DIAGNOSIS — Z01810 Encounter for preprocedural cardiovascular examination: Secondary | ICD-10-CM | POA: Diagnosis present

## 2016-03-09 DIAGNOSIS — M5136 Other intervertebral disc degeneration, lumbar region: Secondary | ICD-10-CM | POA: Diagnosis not present

## 2016-03-09 DIAGNOSIS — R918 Other nonspecific abnormal finding of lung field: Secondary | ICD-10-CM | POA: Diagnosis not present

## 2016-03-09 DIAGNOSIS — I10 Essential (primary) hypertension: Secondary | ICD-10-CM | POA: Insufficient documentation

## 2016-03-09 HISTORY — DX: Unspecified osteoarthritis, unspecified site: M19.90

## 2016-03-09 HISTORY — DX: Spinal stenosis, lumbar region without neurogenic claudication: M48.061

## 2016-03-09 LAB — URINALYSIS, ROUTINE W REFLEX MICROSCOPIC
Bilirubin Urine: NEGATIVE
Glucose, UA: NEGATIVE mg/dL
Hgb urine dipstick: NEGATIVE
Ketones, ur: NEGATIVE mg/dL
Leukocytes, UA: NEGATIVE
Nitrite: NEGATIVE
Protein, ur: NEGATIVE mg/dL
Specific Gravity, Urine: 1.027 (ref 1.005–1.030)
pH: 5 (ref 5.0–8.0)

## 2016-03-09 LAB — COMPREHENSIVE METABOLIC PANEL
ALT: 22 U/L (ref 17–63)
AST: 21 U/L (ref 15–41)
Albumin: 4.4 g/dL (ref 3.5–5.0)
Alkaline Phosphatase: 61 U/L (ref 38–126)
Anion gap: 8 (ref 5–15)
BUN: 23 mg/dL — ABNORMAL HIGH (ref 6–20)
CO2: 25 mmol/L (ref 22–32)
Calcium: 9.3 mg/dL (ref 8.9–10.3)
Chloride: 105 mmol/L (ref 101–111)
Creatinine, Ser: 0.89 mg/dL (ref 0.61–1.24)
GFR calc Af Amer: >60 mL/min (ref >60)
GFR calc non Af Amer: >60 mL/min (ref >60)
Glucose, Bld: 97 mg/dL (ref 65–99)
Potassium: 4.3 mmol/L (ref 3.5–5.1)
Sodium: 138 mmol/L (ref 135–145)
Total Bilirubin: 0.5 mg/dL (ref 0.3–1.2)
Total Protein: 7.3 g/dL (ref 6.5–8.1)

## 2016-03-09 LAB — CBC WITH DIFFERENTIAL/PLATELET
Basophils Absolute: 0 10*3/uL (ref 0.0–0.1)
Basophils Relative: 1 %
Eosinophils Absolute: 0.3 10*3/uL (ref 0.0–0.7)
Eosinophils Relative: 5 %
HCT: 42.6 % (ref 39.0–52.0)
Hemoglobin: 14.3 g/dL (ref 13.0–17.0)
Lymphocytes Relative: 36 %
Lymphs Abs: 2.2 10*3/uL (ref 0.7–4.0)
MCH: 30.5 pg (ref 26.0–34.0)
MCHC: 33.6 g/dL (ref 30.0–36.0)
MCV: 90.8 fL (ref 78.0–100.0)
Monocytes Absolute: 0.6 10*3/uL (ref 0.1–1.0)
Monocytes Relative: 9 %
Neutro Abs: 3.1 10*3/uL (ref 1.7–7.7)
Neutrophils Relative %: 49 %
Platelets: 183 10*3/uL (ref 150–400)
RBC: 4.69 MIL/uL (ref 4.22–5.81)
RDW: 13.7 % (ref 11.5–15.5)
WBC: 6.2 10*3/uL (ref 4.0–10.5)

## 2016-03-09 LAB — APTT: aPTT: 33 seconds (ref 24–36)

## 2016-03-09 LAB — ABO/RH: ABO/RH(D): O POS

## 2016-03-09 LAB — PROTIME-INR
INR: 0.87
Prothrombin Time: 11.8 seconds (ref 11.4–15.2)

## 2016-03-09 LAB — SURGICAL PCR SCREEN
MRSA, PCR: NEGATIVE
STAPHYLOCOCCUS AUREUS: NEGATIVE

## 2016-03-15 NOTE — H&P (Signed)
Justin Young is an 56 y.o. male.   Chief Complaint: low back pain HPI: The patient presented with the chief complaint of low back pain with radiation of pain into the right lower extremity. Motor deficits were identified in the right lower extremity. 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 He did not improve with conservative treatments including cortisone injections and oral corticosteroids.   Past Medical History:  Diagnosis Date  . Arthritis   . GERD (gastroesophageal reflux disease)    mild -rolaids if necessary  . Hypertension   . Insomnia   . Spinal stenosis at L4-L5 level     Past Surgical History:  Procedure Laterality Date  . ANKLE RECONSTRUCTION  left   3 procedures total  . CLAVICLE SURGERY     bilateral  . KNEE ARTHROSCOPY  2006-most current   left knee X 2 1 ON RIGHT  . KNEE ARTHROSCOPY WITH MEDIAL MENISECTOMY  01/27/2012   Procedure: KNEE ARTHROSCOPY WITH MEDIAL MENISECTOMY;  Surgeon: Jacki Cones, MD;  Location: [facility];  Service: Orthopedics;  Laterality: Left;  . TONSILLECTOMY  AS CHILD    Family History  Problem Relation Age of Onset  . Arthritis Mother   . Cancer Mother     Ovarian cancer primary,colon cancer.  . Diabetes Mother   . Colon cancer Mother   . Alcohol abuse Father   . Hyperlipidemia Father   . Hypertension Father   . Heart disease Father   . Heart disease Sister   . Hypertension Sister   . Sleep apnea Sister   . Arthritis Maternal Grandmother    Social History:  reports that he quit smoking about 12 years ago. He has never used smokeless tobacco. He reports that he drinks about 4.2 oz of alcohol per week . He reports that he does not use drugs.  Allergies:  Allergies  Allergen Reactions  . Sulfa Antibiotics Other (See Comments)    From when younger.  . Codeine Itching    Current Outpatient Prescriptions:  .  amLODipine (NORVASC) 5 MG  tablet, Take 5 mg by mouth daily., Disp: , Rfl: 5 .  ibuprofen (ADVIL,MOTRIN) 200 MG tablet, Take 800 mg by mouth 2 (two) times daily as needed (for pain.).,  .  loratadine (CLARITIN) 10 MG tablet, Take 10 mg by mouth daily. , Disp: , Rfl:  .  metoprolol (LOPRESSOR) 50 MG tablet, Take 50 mg by mouth 2 (two) times daily. , Disp: , Rfl:    Review of Systems  Constitutional: Negative.   HENT: Negative.   Eyes: Negative.   Respiratory: Negative.   Cardiovascular: Negative.   Gastrointestinal: Negative.   Genitourinary: Negative.   Musculoskeletal: Positive for back pain, joint pain and myalgias. Negative for falls and neck pain.  Skin: Negative.   Neurological: Negative.   Endo/Heme/Allergies: Negative.   Psychiatric/Behavioral: Negative.    Vitals Weight: 219 lb Height: 70in Body Surface Area: 2.17 m Body Mass Index: 31.42 kg/m  BP: 150/110 (Sitting, Left Arm, Standard) HR:76  Physical Exam  Constitutional: He is oriented to person, place, and time. He appears well-developed. No distress.  Overweight  HENT:  Head: Normocephalic and atraumatic.  Right Ear: External ear normal.  Left Ear: External ear normal.  Nose: Nose normal.  Mouth/Throat: Oropharynx is clear and moist.  Eyes: Conjunctivae and EOM are normal.  Neck: Normal range of motion. Neck supple.  Cardiovascular: Normal rate, regular rhythm,  normal heart sounds and intact distal pulses.   No murmur heard. Respiratory: Effort normal and breath sounds normal. No respiratory distress. He has no wheezes.  GI: Soft. Bowel sounds are normal. He exhibits no distension. There is no tenderness.  Musculoskeletal:       Right hip: Normal.       Left hip: Normal.       Right knee: Normal.       Left knee: Normal.       Lumbar back: He exhibits normal range of motion, no pain and no spasm.  Neurological: He is alert and oriented to person, place, and time. No sensory deficit.  Decreased strength EHL and dorsiflexors  right foot 2/5  Skin: No rash noted. He is not diaphoretic. No erythema.  Psychiatric: He has a normal mood and affect. His behavior is normal.     Assessment/Plan Lumbar spinal stenosis L4-L5 He needs a lumbar laminectomy and central decompression L4-L5. Risks and benefits of the procedure were discussed with the patient by Dr. Darrelyn HillockGioffre.   Charlott Calvario LAUREN, PA-C 03/15/2016, 1:24 PM

## 2016-03-16 ENCOUNTER — Observation Stay (HOSPITAL_COMMUNITY)
Admission: RE | Admit: 2016-03-16 | Discharge: 2016-03-17 | Disposition: A | Discharge status: Home Health | Payer: BLUE CROSS/BLUE SHIELD | Source: Ambulatory Visit | Attending: Orthopedic Surgery | Admitting: Orthopedic Surgery

## 2016-03-16 ENCOUNTER — Ambulatory Visit (HOSPITAL_COMMUNITY): Payer: BLUE CROSS/BLUE SHIELD | Admitting: Anesthesiology

## 2016-03-16 ENCOUNTER — Ambulatory Visit (HOSPITAL_COMMUNITY): Payer: BLUE CROSS/BLUE SHIELD

## 2016-03-16 ENCOUNTER — Encounter (HOSPITAL_COMMUNITY): Payer: Self-pay | Admitting: Anesthesiology

## 2016-03-16 ENCOUNTER — Encounter (HOSPITAL_COMMUNITY)
Admission: RE | Disposition: A | Discharge status: Home Health | Payer: Self-pay | Source: Ambulatory Visit | Attending: Orthopedic Surgery

## 2016-03-16 DIAGNOSIS — Z79899 Other long term (current) drug therapy: Secondary | ICD-10-CM | POA: Insufficient documentation

## 2016-03-16 DIAGNOSIS — I10 Essential (primary) hypertension: Secondary | ICD-10-CM | POA: Insufficient documentation

## 2016-03-16 DIAGNOSIS — M5126 Other intervertebral disc displacement, lumbar region: Secondary | ICD-10-CM | POA: Insufficient documentation

## 2016-03-16 DIAGNOSIS — M21371 Foot drop, right foot: Secondary | ICD-10-CM | POA: Insufficient documentation

## 2016-03-16 DIAGNOSIS — M48061 Spinal stenosis, lumbar region without neurogenic claudication: Secondary | ICD-10-CM | POA: Diagnosis present

## 2016-03-16 DIAGNOSIS — Z419 Encounter for procedure for purposes other than remedying health state, unspecified: Secondary | ICD-10-CM

## 2016-03-16 DIAGNOSIS — M48062 Spinal stenosis, lumbar region with neurogenic claudication: Principal | ICD-10-CM | POA: Insufficient documentation

## 2016-03-16 DIAGNOSIS — Z87891 Personal history of nicotine dependence: Secondary | ICD-10-CM | POA: Diagnosis not present

## 2016-03-16 HISTORY — PX: LUMBAR LAMINECTOMY/DECOMPRESSION MICRODISCECTOMY: SHX5026

## 2016-03-16 LAB — TYPE AND SCREEN
ABO/RH(D): O POS
Antibody Screen: NEGATIVE

## 2016-03-16 SURGERY — LUMBAR LAMINECTOMY/DECOMPRESSION MICRODISCECTOMY
Anesthesia: General | Site: Back

## 2016-03-16 MED ORDER — AMLODIPINE BESYLATE 5 MG PO TABS
5.0000 mg | ORAL_TABLET | Freq: Every day | ORAL | Status: DC
  Filled 2016-03-16: qty 1

## 2016-03-16 MED ORDER — BUPIVACAINE-EPINEPHRINE 0.5% -1:200000 IJ SOLN
INTRAMUSCULAR | Status: DC | PRN
  Administered 2016-03-16: 20 mL

## 2016-03-16 MED ORDER — SUGAMMADEX SODIUM 500 MG/5ML IV SOLN
INTRAVENOUS | Status: AC
  Filled 2016-03-16: qty 5

## 2016-03-16 MED ORDER — BUPIVACAINE LIPOSOME 1.3 % IJ SUSP
INTRAMUSCULAR | Status: DC | PRN
  Administered 2016-03-16: 20 mL

## 2016-03-16 MED ORDER — PROPOFOL 10 MG/ML IV BOLUS
INTRAVENOUS | Status: AC
  Filled 2016-03-16: qty 20

## 2016-03-16 MED ORDER — ACETAMINOPHEN 325 MG PO TABS
650.0000 mg | ORAL_TABLET | ORAL | Status: DC | PRN
  Administered 2016-03-16 – 2016-03-17 (×2): 650 mg via ORAL
  Filled 2016-03-16 (×2): qty 2

## 2016-03-16 MED ORDER — BACITRACIN-NEOMYCIN-POLYMYXIN 400-5-5000 EX OINT
TOPICAL_OINTMENT | CUTANEOUS | Status: AC
  Filled 2016-03-16: qty 1

## 2016-03-16 MED ORDER — ROCURONIUM BROMIDE 10 MG/ML (PF) SYRINGE
PREFILLED_SYRINGE | INTRAVENOUS | Status: DC | PRN
  Administered 2016-03-16: 50 mg via INTRAVENOUS
  Administered 2016-03-16 (×3): 10 mg via INTRAVENOUS

## 2016-03-16 MED ORDER — EPHEDRINE SULFATE-NACL 50-0.9 MG/10ML-% IV SOSY
PREFILLED_SYRINGE | INTRAVENOUS | Status: DC | PRN
  Administered 2016-03-16 (×4): 5 mg via INTRAVENOUS

## 2016-03-16 MED ORDER — HYDROMORPHONE HCL 1 MG/ML IJ SOLN
1.0000 mg | INTRAMUSCULAR | Status: DC | PRN
  Administered 2016-03-16: 1 mg via INTRAVENOUS
  Filled 2016-03-16: qty 1

## 2016-03-16 MED ORDER — EPHEDRINE 5 MG/ML INJ
INTRAVENOUS | Status: AC
  Filled 2016-03-16: qty 10

## 2016-03-16 MED ORDER — ASPIRIN EC 325 MG PO TBEC: 325.0000 mg | DELAYED_RELEASE_TABLET | Freq: Every day | ORAL | 0 refills | Status: DC

## 2016-03-16 MED ORDER — BUPIVACAINE-EPINEPHRINE (PF) 0.5% -1:200000 IJ SOLN
INTRAMUSCULAR | Status: AC
  Filled 2016-03-16: qty 30

## 2016-03-16 MED ORDER — LIDOCAINE 2% (20 MG/ML) 5 ML SYRINGE
INTRAMUSCULAR | Status: DC | PRN
  Administered 2016-03-16: 100 mg via INTRAVENOUS

## 2016-03-16 MED ORDER — SUFENTANIL CITRATE 50 MCG/ML IV SOLN
INTRAVENOUS | Status: DC | PRN
Start: 2016-03-16 — End: 2016-03-16
  Administered 2016-03-16: 10 ug via INTRAVENOUS

## 2016-03-16 MED ORDER — LACTATED RINGERS IV SOLN
INTRAVENOUS | Status: DC
  Administered 2016-03-16 (×2): via INTRAVENOUS

## 2016-03-16 MED ORDER — ROCURONIUM BROMIDE 50 MG/5ML IV SOSY
PREFILLED_SYRINGE | INTRAVENOUS | Status: AC
  Filled 2016-03-16: qty 5

## 2016-03-16 MED ORDER — ONDANSETRON HCL 4 MG/2ML IJ SOLN
INTRAMUSCULAR | Status: AC
  Filled 2016-03-16: qty 2

## 2016-03-16 MED ORDER — BUPIVACAINE LIPOSOME 1.3 % IJ SUSP
20.0000 mL | Freq: Once | INTRAMUSCULAR | Status: DC
  Filled 2016-03-16: qty 20

## 2016-03-16 MED ORDER — CEFAZOLIN IN D5W 1 GM/50ML IV SOLN
1.0000 g | Freq: Three times a day (TID) | INTRAVENOUS | Status: AC
  Administered 2016-03-16 – 2016-03-17 (×3): 1 g via INTRAVENOUS
  Filled 2016-03-16 (×3): qty 50

## 2016-03-16 MED ORDER — LACTATED RINGERS IV SOLN
INTRAVENOUS | Status: DC
  Administered 2016-03-16: via INTRAVENOUS

## 2016-03-16 MED ORDER — PROPOFOL 10 MG/ML IV BOLUS
INTRAVENOUS | Status: DC | PRN
  Administered 2016-03-16: 200 mg via INTRAVENOUS

## 2016-03-16 MED ORDER — ACETAMINOPHEN 650 MG RE SUPP: 650.0000 mg | Freq: Four times a day (QID) | RECTAL | Status: DC | PRN

## 2016-03-16 MED ORDER — MIDAZOLAM HCL 2 MG/2ML IJ SOLN
INTRAMUSCULAR | Status: AC
  Filled 2016-03-16: qty 2

## 2016-03-16 MED ORDER — METHOCARBAMOL 1000 MG/10ML IJ SOLN
500.0000 mg | Freq: Four times a day (QID) | INTRAMUSCULAR | Status: DC | PRN
  Filled 2016-03-16: qty 5

## 2016-03-16 MED ORDER — DEXAMETHASONE SODIUM PHOSPHATE 10 MG/ML IJ SOLN
INTRAMUSCULAR | Status: AC
  Filled 2016-03-16: qty 1

## 2016-03-16 MED ORDER — ALUM & MAG HYDROXIDE-SIMETH 200-200-20 MG/5ML PO SUSP: 30.0000 mL | Freq: Four times a day (QID) | ORAL | Status: DC | PRN

## 2016-03-16 MED ORDER — HYDROMORPHONE HCL 2 MG PO TABS: 2.0000 mg | ORAL_TABLET | ORAL | 0 refills | Status: DC | PRN

## 2016-03-16 MED ORDER — FLEET ENEMA 7-19 GM/118ML RE ENEM
1.0000 | ENEMA | Freq: Once | RECTAL | Status: DC | PRN
Start: 2016-03-16 — End: 2016-03-17

## 2016-03-16 MED ORDER — METOPROLOL TARTRATE 50 MG PO TABS
50.0000 mg | ORAL_TABLET | Freq: Two times a day (BID) | ORAL | Status: DC
  Administered 2016-03-16 – 2016-03-17 (×2): 50 mg via ORAL
  Filled 2016-03-16 (×2): qty 1

## 2016-03-16 MED ORDER — DEXAMETHASONE SODIUM PHOSPHATE 10 MG/ML IJ SOLN
INTRAMUSCULAR | Status: DC | PRN
  Administered 2016-03-16: 10 mg via INTRAVENOUS

## 2016-03-16 MED ORDER — SODIUM CHLORIDE 0.9 % IR SOLN
Status: AC
  Filled 2016-03-16: qty 500000

## 2016-03-16 MED ORDER — SUGAMMADEX SODIUM 200 MG/2ML IV SOLN
INTRAVENOUS | Status: DC | PRN
  Administered 2016-03-16: 193.8 mg via INTRAVENOUS

## 2016-03-16 MED ORDER — METHOCARBAMOL 500 MG PO TABS
500.0000 mg | ORAL_TABLET | Freq: Four times a day (QID) | ORAL | Status: DC | PRN
  Administered 2016-03-16 – 2016-03-17 (×3): 500 mg via ORAL
  Filled 2016-03-16 (×3): qty 1

## 2016-03-16 MED ORDER — BISACODYL 5 MG PO TBEC
5.0000 mg | DELAYED_RELEASE_TABLET | Freq: Every day | ORAL | Status: DC | PRN
  Administered 2016-03-17: 09:00:00 5 mg via ORAL
  Filled 2016-03-16: qty 1

## 2016-03-16 MED ORDER — ONDANSETRON HCL 4 MG/2ML IJ SOLN: 4.0000 mg | INTRAMUSCULAR | Status: DC | PRN

## 2016-03-16 MED ORDER — CEFAZOLIN SODIUM-DEXTROSE 2-4 GM/100ML-% IV SOLN
2.0000 g | INTRAVENOUS | Status: AC
  Administered 2016-03-16: 2 g via INTRAVENOUS
  Filled 2016-03-16: qty 100

## 2016-03-16 MED ORDER — MENTHOL 3 MG MT LOZG: 1.0000 | LOZENGE | OROMUCOSAL | Status: DC | PRN

## 2016-03-16 MED ORDER — SODIUM CHLORIDE 0.9 % IJ SOLN
INTRAMUSCULAR | Status: AC
  Filled 2016-03-16: qty 10

## 2016-03-16 MED ORDER — MIDAZOLAM HCL 5 MG/5ML IJ SOLN
INTRAMUSCULAR | Status: DC | PRN
  Administered 2016-03-16: 2 mg via INTRAVENOUS

## 2016-03-16 MED ORDER — PROMETHAZINE HCL 25 MG/ML IJ SOLN: 6.2500 mg | INTRAMUSCULAR | Status: DC | PRN

## 2016-03-16 MED ORDER — AMLODIPINE BESYLATE 5 MG PO TABS: 5.0000 mg | ORAL_TABLET | Freq: Every day | ORAL | Status: DC

## 2016-03-16 MED ORDER — POLYETHYLENE GLYCOL 3350 17 G PO PACK: 17.0000 g | PACK | Freq: Every day | ORAL | Status: DC | PRN

## 2016-03-16 MED ORDER — ONDANSETRON HCL 4 MG/2ML IJ SOLN
INTRAMUSCULAR | Status: DC | PRN
  Administered 2016-03-16: 4 mg via INTRAVENOUS

## 2016-03-16 MED ORDER — METHOCARBAMOL 500 MG PO TABS
500.0000 mg | ORAL_TABLET | Freq: Four times a day (QID) | ORAL | 1 refills | Status: DC | PRN
Start: 2016-03-16 — End: 2017-03-13

## 2016-03-16 MED ORDER — BACITRACIN-NEOMYCIN-POLYMYXIN 400-5-5000 EX OINT
TOPICAL_OINTMENT | CUTANEOUS | Status: DC | PRN
  Administered 2016-03-16: 1 via TOPICAL

## 2016-03-16 MED ORDER — ACETAMINOPHEN 325 MG PO TABS: 650.0000 mg | ORAL_TABLET | Freq: Four times a day (QID) | ORAL | Status: DC | PRN

## 2016-03-16 MED ORDER — ACETAMINOPHEN 650 MG RE SUPP: 650.0000 mg | RECTAL | Status: DC | PRN

## 2016-03-16 MED ORDER — SUFENTANIL CITRATE 50 MCG/ML IV SOLN
INTRAVENOUS | Status: AC
  Filled 2016-03-16: qty 1

## 2016-03-16 MED ORDER — HYDROMORPHONE HCL 1 MG/ML IJ SOLN
0.2500 mg | INTRAMUSCULAR | Status: DC | PRN
Start: 2016-03-16 — End: 2016-03-16
  Administered 2016-03-16 (×2): 0.5 mg via INTRAVENOUS

## 2016-03-16 MED ORDER — SUGAMMADEX SODIUM 200 MG/2ML IV SOLN
INTRAVENOUS | Status: AC
  Filled 2016-03-16: qty 2

## 2016-03-16 MED ORDER — PHENOL 1.4 % MT LIQD
1.0000 | OROMUCOSAL | Status: DC | PRN
  Filled 2016-03-16: qty 177

## 2016-03-16 MED ORDER — HYDROMORPHONE HCL 1 MG/ML IJ SOLN
INTRAMUSCULAR | Status: AC
  Administered 2016-03-16: 0.5 mg via INTRAVENOUS
  Filled 2016-03-16: qty 1

## 2016-03-16 MED ORDER — SODIUM CHLORIDE 0.9 % IR SOLN
Status: DC | PRN
  Administered 2016-03-16: 500 mL

## 2016-03-16 MED ORDER — LIDOCAINE 2% (20 MG/ML) 5 ML SYRINGE
INTRAMUSCULAR | Status: AC
  Filled 2016-03-16: qty 10

## 2016-03-16 MED ORDER — AMLODIPINE BESYLATE 5 MG PO TABS
5.0000 mg | ORAL_TABLET | Freq: Every day | ORAL | Status: DC
  Administered 2016-03-16 – 2016-03-17 (×2): 5 mg via ORAL
  Filled 2016-03-16: qty 1

## 2016-03-16 MED ORDER — HYDROMORPHONE HCL 2 MG PO TABS
2.0000 mg | ORAL_TABLET | ORAL | Status: DC | PRN
  Administered 2016-03-16 – 2016-03-17 (×6): 2 mg via ORAL
  Filled 2016-03-16 (×8): qty 1

## 2016-03-16 SURGICAL SUPPLY — 40 items
BAG SPEC THK2 15X12 ZIP CLS (MISCELLANEOUS)
BAG ZIPLOCK 12X15 (MISCELLANEOUS) IMPLANT
CLEANER TIP ELECTROSURG 2X2 (MISCELLANEOUS) ×3 IMPLANT
DRAIN PENROSE 18X1/4 LTX STRL (WOUND CARE) IMPLANT
DRAPE MICROSCOPE LEICA (MISCELLANEOUS) ×3 IMPLANT
DRAPE POUCH INSTRU U-SHP 10X18 (DRAPES) ×3 IMPLANT
DRAPE SHEET LG 3/4 BI-LAMINATE (DRAPES) ×3 IMPLANT
DRAPE SURG 17X11 SM STRL (DRAPES) ×3 IMPLANT
DRSG ADAPTIC 3X8 NADH LF (GAUZE/BANDAGES/DRESSINGS) ×3 IMPLANT
DRSG PAD ABDOMINAL 8X10 ST (GAUZE/BANDAGES/DRESSINGS) ×3 IMPLANT
DURAPREP 26ML APPLICATOR (WOUND CARE) ×3 IMPLANT
ELECT BLADE TIP CTD 4 INCH (ELECTRODE) ×3 IMPLANT
ELECT REM PT RETURN 9FT ADLT (ELECTROSURGICAL) ×3
ELECTRODE REM PT RTRN 9FT ADLT (ELECTROSURGICAL) ×1 IMPLANT
GAUZE SPONGE 4X4 12PLY STRL (GAUZE/BANDAGES/DRESSINGS) ×3 IMPLANT
GLOVE BIOGEL PI IND STRL 8 (GLOVE) ×1 IMPLANT
GLOVE BIOGEL PI INDICATOR 8 (GLOVE) ×2
GLOVE ECLIPSE 8.0 STRL XLNG CF (GLOVE) ×6 IMPLANT
GOWN STRL REUS W/TWL XL LVL3 (GOWN DISPOSABLE) ×6 IMPLANT
HEMOSTAT SPONGE AVITENE ULTRA (HEMOSTASIS) ×3 IMPLANT
KIT BASIN OR (CUSTOM PROCEDURE TRAY) ×3 IMPLANT
KIT POSITIONING SURG ANDREWS (MISCELLANEOUS) ×3 IMPLANT
MANIFOLD NEPTUNE II (INSTRUMENTS) ×3 IMPLANT
MARKER SKIN DUAL TIP RULER LAB (MISCELLANEOUS) ×3 IMPLANT
NEEDLE HYPO 22GX1.5 SAFETY (NEEDLE) ×6 IMPLANT
NEEDLE SPNL 18GX3.5 QUINCKE PK (NEEDLE) ×6 IMPLANT
PACK LAMINECTOMY ORTHO (CUSTOM PROCEDURE TRAY) ×3 IMPLANT
PATTIES SURGICAL .5 X.5 (GAUZE/BANDAGES/DRESSINGS) IMPLANT
PATTIES SURGICAL .75X.75 (GAUZE/BANDAGES/DRESSINGS) ×3 IMPLANT
PATTIES SURGICAL 1X1 (DISPOSABLE) ×3 IMPLANT
PIN SAFETY NICK PLATE  2 MED (MISCELLANEOUS)
PIN SAFETY NICK PLATE 2 MED (MISCELLANEOUS) IMPLANT
SPONGE LAP 4X18 X RAY DECT (DISPOSABLE) ×9 IMPLANT
STAPLER VISISTAT 35W (STAPLE) ×3 IMPLANT
SUT VIC AB 0 CT1 27 (SUTURE) ×2
SUT VIC AB 0 CT1 27XBRD ANTBC (SUTURE) ×1 IMPLANT
SUT VIC AB 1 CT1 27 (SUTURE) ×9
SUT VIC AB 1 CT1 27XBRD ANTBC (SUTURE) ×3 IMPLANT
SYR 20CC LL (SYRINGE) ×3 IMPLANT
TOWEL OR 17X26 10 PK STRL BLUE (TOWEL DISPOSABLE) ×3 IMPLANT

## 2016-03-16 NOTE — Discharge Instructions (Signed)
For the first three days, remove your dressing and tape a piece of saran wrap over your incision °Take your shower, then remove the saran wrap and put a clean dressing on. °After three days you can shower without the saran wrap.  °No lifting or bending. °No driving while taking pain medications. °Call Dr. Zynia Wojtowicz if any wound complications or temperature of 101 degrees F or over.  °Call the office for an appointment to see Dr. Raden Byington in two weeks: [phone] and ask for Dr. Ferol Laiche's nurse, Tammy Johnson.  °

## 2016-03-16 NOTE — Brief Op Note (Signed)
03/16/2016  1:28 PM  PATIENT:  Justin Young  56 y.o. male  PRE-OPERATIVE DIAGNOSIS:  Spinal Stenosis L4-L5 lumbar and Foraminal Stenosis involving the L-4 and L-5 nerve roots on the Right. Partial Foot Drop on the Right.  POST-OPERATIVE DIAGNOSIS:  Same as Pre-Op  PROCEDURE:  Procedure(s): CENTRAL DECOMPRESSIVE LUMBAR LAMINECTOMY LUMBAR FOUR TO LUMBAR FIVE (N/A) for Spinal Stenosis and Foraminotomies for the L-4 and L-5 roots on the Right  SURGEON:  Surgeon(s) and Role:    * Ranee Gosselinonald Hamid Brookens, MD - Primary  PHYSICIAN ASSISTANT:Amber Longviewonstable PA   ASSISTANTS: Dimitri PedAmber Constable PA  ANESTHESIA:   general  EBL:  Total I/O In: 1000 [I.V.:1000] Out: 25 [Blood:25]  BLOOD ADMINISTERED:none  DRAINS: none   LOCAL MEDICATIONS USED:  MARCAINE 20cc of 0.50% with Epinephrine at the start of the case and 20 cc of Exparel at the end of the case.    SPECIMEN:  No Specimen  DISPOSITION OF SPECIMEN:  N/A  COUNTS:  YES  TOURNIQUET:  * No tourniquets in log *  DICTATION: .Other Dictation: Dictation Number [id number]  PLAN OF CARE: Admit for overnight observation  PATIENT DISPOSITION:  PACU - hemodynamically stable.   Delay start of Pharmacological VTE agent (>24hrs) due to surgical blood loss or risk of bleeding: yes

## 2016-03-16 NOTE — Anesthesia Preprocedure Evaluation (Addendum)
Anesthesia Evaluation  Patient identified by MRN, date of birth, ID band Patient awake    Reviewed: Allergy & Precautions, NPO status , Patient's Chart, lab work & pertinent test results  Airway Mallampati: II  TM Distance: >3 FB Neck ROM: Full    Dental no notable dental hx.    Pulmonary neg pulmonary ROS, former smoker,    Pulmonary exam normal breath sounds clear to auscultation       Cardiovascular hypertension, Normal cardiovascular exam Rhythm:Regular Rate:Normal     Neuro/Psych negative neurological ROS  negative psych ROS   GI/Hepatic negative GI ROS, Neg liver ROS,   Endo/Other  negative endocrine ROS  Renal/GU negative Renal ROS  negative genitourinary   Musculoskeletal negative musculoskeletal ROS (+)   Abdominal   Peds negative pediatric ROS (+)  Hematology negative hematology ROS (+)   Anesthesia Other Findings   Reproductive/Obstetrics negative OB ROS                             Anesthesia Physical Anesthesia Plan  ASA: II  Anesthesia Plan: General   Post-op Pain Management:    Induction: Intravenous  Airway Management Planned: Oral ETT  Additional Equipment:   Intra-op Plan:   Post-operative Plan: Extubation in OR  Informed Consent: I have reviewed the patients History and Physical, chart, labs and discussed the procedure including the risks, benefits and alternatives for the proposed anesthesia with the patient or authorized representative who has indicated his/her understanding and acceptance.   Dental advisory given  Plan Discussed with: CRNA and Surgeon  Anesthesia Plan Comments:         Anesthesia Quick Evaluation  

## 2016-03-16 NOTE — Anesthesia Postprocedure Evaluation (Addendum)
Anesthesia Post Note  Patient: Justin Young  Procedure(s) Performed: Procedure(s) (LRB): CENTRAL DECOMPRESSIVE LUMBAR LAMINECTOMY LUMBAR FOUR TO LUMBAR FIVE (N/A)  Patient location during evaluation: PACU Anesthesia Type: General Level of consciousness: awake and alert Pain management: pain level controlled Vital Signs Assessment: post-procedure vital signs reviewed and stable Respiratory status: spontaneous breathing, nonlabored ventilation, respiratory function stable and patient connected to nasal cannula oxygen Cardiovascular status: blood pressure returned to baseline and stable Postop Assessment: no signs of nausea or vomiting Anesthetic complications: no       Last Vitals:  Vitals:   03/16/16 1345 03/16/16 1400  BP: (!) 126/94 132/87  Pulse: 72 66  Resp: 16 14  Temp:      Last Pain:  Vitals:   03/16/16 1400  TempSrc:   PainSc: 2                  [name] S

## 2016-03-16 NOTE — Anesthesia Procedure Notes (Signed)
Procedure Name: Intubation Date/Time: 03/16/2016 11:12 AM Performed by: Ac Colan, Nuala AlphaKRISTOPHER Pre-anesthesia Checklist: Patient identified, Emergency Drugs available, Suction available, Patient being monitored and Timeout performed Patient Re-evaluated:Patient Re-evaluated prior to inductionOxygen Delivery Method: Circle system utilized Preoxygenation: Pre-oxygenation with 100% oxygen Intubation Type: IV induction Ventilation: Two handed mask ventilation required Laryngoscope Size: Miller and 2 Grade View: Grade I Tube type: Oral Tube size: 7.5 mm Number of attempts: 1 Airway Equipment and Method: Stylet Placement Confirmation: ETT inserted through vocal cords under direct vision,  positive ETCO2 and breath sounds checked- equal and bilateral Secured at: 21 cm Tube secured with: Tape Dental Injury: Teeth and Oropharynx as per pre-operative assessment  Comments: Intubated by Larey SeatJ Slabach SRNA

## 2016-03-16 NOTE — Interval H&P Note (Signed)
History and Physical Interval Note:  03/16/2016 10:31 AM  Justin Young  has presented today for surgery, with the diagnosis of Spinal Stenosis L4-L5 lumbar   The various methods of treatment have been discussed with the patient and family. After consideration of risks, benefits and other options for treatment, the patient has consented to  Procedure(s): LUMBAR LAMINECTOMY/DECOMPRESSION L4-L5 (N/A) as a surgical intervention .  The patient's history has been reviewed, patient examined, no change in status, stable for surgery.  I have reviewed the patient's chart and labs.  Questions were answered to the patient's satisfaction.     Nadina Fomby A

## 2016-03-16 NOTE — Transfer of Care (Signed)
Immediate Anesthesia Transfer of Care Note  Patient: Justin Young  Procedure(s) Performed: Procedure(s): CENTRAL DECOMPRESSIVE LUMBAR LAMINECTOMY LUMBAR FOUR TO LUMBAR FIVE (N/A)  Patient Location: PACU  Anesthesia Type:General  Level of Consciousness:  sedated, patient cooperative and responds to stimulation  Airway & Oxygen Therapy:Patient Spontanous Breathing and Patient connected to face mask oxgen  Post-op Assessment:  Report given to PACU RN and Post -op Vital signs reviewed and stable  Post vital signs:  Reviewed and stable  Last Vitals:  Vitals:   03/16/16 0845  BP: (!) 139/96  Pulse: (!) 57  Resp: 16  Temp: 36.6 C    Complications: No apparent anesthesia complications

## 2016-03-17 ENCOUNTER — Encounter (HOSPITAL_COMMUNITY): Payer: Self-pay | Admitting: Orthopedic Surgery

## 2016-03-17 DIAGNOSIS — M48062 Spinal stenosis, lumbar region with neurogenic claudication: Secondary | ICD-10-CM | POA: Diagnosis not present

## 2016-03-17 NOTE — Progress Notes (Signed)
Subjective: 1 Day Post-Op Procedure(s) (LRB): CENTRAL DECOMPRESSIVE LUMBAR LAMINECTOMY LUMBAR FOUR TO LUMBAR FIVE (N/A) Patient reports pain as 1 on 0-10 scale. Doing very well today. Will DC   Objective: Vital signs in last 24 hours: Temp:  [97.4 F (36.3 C)-98.6 F (37 C)] 98 F (36.7 C) (03/01 0651) Pulse Rate:  [57-77] 69 (03/01 0651) Resp:  [13-16] 15 (03/01 0651) BP: (126-171)/(68-104) 138/70 (03/01 0651) SpO2:  [95 %-100 %] 98 % (03/01 0651) Weight:  [96.9 kg (213 lb 9 oz)] 96.9 kg (213 lb 9 oz) (02/28 0845)  Intake/Output from previous day: 02/28 0701 - 03/01 0700 In: 3623.3 [P.O.:840; I.V.:2683.3; IV Piggyback:100] Out: 1345 [Urine:1320; Blood:25] Intake/Output this shift: No intake/output data recorded.  No results for input(s): HGB in the last 72 hours. No results for input(s): WBC, RBC, HCT, PLT in the last 72 hours. No results for input(s): NA, K, CL, CO2, BUN, CREATININE, GLUCOSE, CALCIUM in the last 72 hours. No results for input(s): LABPT, INR in the last 72 hours.  Neurologically intact Dorsiflexion/Plantar flexion intact  Assessment/Plan: 1 Day Post-Op Procedure(s) (LRB): CENTRAL DECOMPRESSIVE LUMBAR LAMINECTOMY LUMBAR FOUR TO LUMBAR FIVE (N/A) Up with therapy Discharge home with home health  Justin Young A 03/17/2016, 7:23 AM

## 2016-03-17 NOTE — Evaluation (Signed)
Physical Therapy Evaluation-1x Patient Details Name: Justin Young MRN: [id number] DOB: 08/28/1960 Today's Date: 03/17/2016   History of Present Illness  s/p L4-5 decompression 03/16/16  Clinical Impression  On eval, pt was supervision level assist for mobility. He walked ~250 feet around unit and climbed 4 steps with use of 1 rail. No LOB. Pt denied LE pain. He did report incisional site pain. Reviewed back precautions. Discussed car transfer. No further acute PT needs. 1x eval. Will sign off.     Follow Up Recommendations No PT follow up;Supervision - Intermittent    Equipment Recommendations  None recommended by PT    Recommendations for Other Services       Precautions / Restrictions Precautions Precautions: Back Restrictions Weight Bearing Restrictions: No      Mobility  Bed Mobility        General bed mobility comments: oob in recliner  Transfers Overall transfer level: Modified independent Equipment used: Rolling walker (2 wheeled);None Transfers: Sit to/from Stand Sit to Stand: Modified independent (Device/Increase time)         General transfer comment: cues for back precautions.  Used RW to get to bathroom and walked back without it  Ambulation/Gait Ambulation/Gait assistance: Supervision Ambulation Distance (Feet): 250 Feet Assistive device: None Gait Pattern/deviations: Step-through pattern     General Gait Details: for safety  Stairs Stairs: Yes Stairs assistance: Supervision Stair Management: Forwards;One rail Left;Step to pattern Number of Stairs: 4 General stair comments: for safety  Wheelchair Mobility    Modified Rankin (Stroke Patients Only)       Balance                                             Pertinent Vitals/Pain Pain Assessment: Faces Faces Pain Scale: Hurts little more Pain Location: incision site Pain Descriptors / Indicators: Other (Comment) (incisional) Pain Intervention(s): Monitored during  session    Home Living Family/patient expects to be discharged to:: Private residence Living Arrangements: Spouse/significant other Available Help at Discharge: Family Type of Home: House       Home Layout: Able to live on main level with bedroom/bathroom Home Equipment: Shower seat Additional Comments: has a lot of DME from his mother    Prior Function                 Hand Dominance        Extremity/Trunk Assessment   Upper Extremity Assessment Upper Extremity Assessment: Defer to OT evaluation    Lower Extremity Assessment Lower Extremity Assessment: Overall WFL for tasks assessed    Cervical / Trunk Assessment Cervical / Trunk Assessment: Normal  Communication      Cognition Arousal/Alertness: Awake/alert Behavior During Therapy: WFL for tasks assessed/performed Overall Cognitive Status: Within Functional Limits for tasks assessed                      General Comments      Exercises     Assessment/Plan    PT Assessment Patent does not need any further PT services  PT Problem List         PT Treatment Interventions      PT Goals (Current goals can be found in the Care Plan section)  Acute Rehab PT Goals Patient Stated Goal: return to independence PT Goal Formulation: All assessment and education complete, DC therapy    Frequency  Barriers to discharge        Co-evaluation               End of Session   Activity Tolerance: Patient tolerated treatment well Patient left: in chair;with call bell/phone within reach   PT Visit Diagnosis: Difficulty in walking, not elsewhere classified (R26.2)    Functional Assessment Tool Used: AM-PAC 6 Clicks Basic Mobility;Clinical judgement Functional Limitation: Mobility: Walking and moving around Mobility: Walking and Moving Around Current Status (W0981): At least 1 percent but less than 20 percent impaired, limited or restricted Mobility: Walking and Moving Around Goal Status  [phone]): At least 1 percent but less than 20 percent impaired, limited or restricted Mobility: Walking and Moving Around Discharge Status [phone]): At least 1 percent but less than 20 percent impaired, limited or restricted    Time: 2130-8657 PT Time Calculation (min) (ACUTE ONLY): 8 min   Charges:   PT Evaluation $PT Eval Low Complexity: 1 Procedure     PT G Codes:   PT G-Codes **NOT FOR INPATIENT CLASS** Functional Assessment Tool Used: AM-PAC 6 Clicks Basic Mobility;Clinical judgement Functional Limitation: Mobility: Walking and moving around Mobility: Walking and Moving Around Current Status (Q4696): At least 1 percent but less than 20 percent impaired, limited or restricted Mobility: Walking and Moving Around Goal Status [phone]): At least 1 percent but less than 20 percent impaired, limited or restricted Mobility: Walking and Moving Around Discharge Status [phone]): At least 1 percent but less than 20 percent impaired, limited or restricted     Rebeca Alert, MPT Pager: [phone]

## 2016-03-17 NOTE — Evaluation (Signed)
Occupational Therapy Evaluation Patient Details Name: Justin Young MRN: [id number] DOB: 11/22/1960 Today's Date: 03/17/2016    History of Present Illness s/p L4-5 decompression   Clinical Impression   This 56 year old man was admitted for the above sx. All education was completed. No further OT is needed at this time    Follow Up Recommendations  No OT follow up;Supervision/Assistance - 24 hour    Equipment Recommendations  None recommended by OT    Recommendations for Other Services       Precautions / Restrictions Precautions Precautions: Back;Fall Restrictions Weight Bearing Restrictions: No      Mobility Bed Mobility Overal bed mobility: Needs Assistance Bed Mobility: Rolling;Sidelying to Sit Rolling: Min guard Sidelying to sit: Min guard       General bed mobility comments: for safety.  cues for sequence  Transfers Overall transfer level: Needs assistance Equipment used: Rolling walker (2 wheeled);None Transfers: Sit to/from Stand Sit to Stand: Supervision         General transfer comment: cues for back precautions.  Used RW to get to bathroom and walked back without it    Balance                                            ADL Overall ADL's : Needs assistance/impaired Eating/Feeding: Independent   Grooming: Oral care;Supervision/safety;Standing   Upper Body Bathing: Set up;Sitting   Lower Body Bathing: Moderate assistance;Sit to/from stand   Upper Body Dressing : Set up;Sitting   Lower Body Dressing: Maximal assistance;Sit to/from stand   Toilet Transfer: Surveyor, quantityMin guard;Regular Toilet;Grab bars Toilet Transfer Details (indicate cue type and reason): pt has a standard commode at home:  doesn't want DME. Educated on sitting backwards for tank support if needed--has grab bar Toileting- Clothing Manipulation and Hygiene: Minimal assistance;Sit to/from Nurse, children'sstand     Tub/Shower Transfer Details (indicate cue type and reason): educated  on walk in shower Functional mobility during ADLs: Min guard General ADL Comments: educated on back precautions and adls as well as AE including toilet aide. Wife is available to assist. They took care of his mother     Vision         Perception     Praxis      Pertinent Vitals/Pain Pain Assessment: Faces Faces Pain Scale: Hurts little more Pain Descriptors / Indicators: Other (Comment) (incisional) Pain Intervention(s): Limited activity within patient's tolerance;Monitored during session;Premedicated before session;Repositioned     Hand Dominance     Extremity/Trunk Assessment Upper Extremity Assessment Upper Extremity Assessment: Overall WFL for tasks assessed           Communication     Cognition Arousal/Alertness: Awake/alert Behavior During Therapy: WFL for tasks assessed/performed Overall Cognitive Status: Within Functional Limits for tasks assessed                     General Comments       Exercises       Shoulder Instructions      Home Living Family/patient expects to be discharged to:: Private residence Living Arrangements: Spouse/significant other Available Help at Discharge: Family Type of Home: House       Home Layout: Able to live on main level with bedroom/bathroom     Bathroom Shower/Tub: Producer, television/film/videoWalk-in shower   Bathroom Toilet: Standard     Home Equipment: Shower seat   Additional Comments:  has a lot of DME from his mother      Prior Functioning/Environment                   OT Problem List:        OT Treatment/Interventions:      OT Goals(Current goals can be found in the care plan section) Acute Rehab OT Goals Patient Stated Goal: return to independence OT Goal Formulation: All assessment and education complete, DC therapy  OT Frequency:     Barriers to D/C:            Co-evaluation              End of Session Nurse Communication: Mobility status  Activity Tolerance: Patient tolerated treatment  well Patient left: in chair;with call bell/phone within reach  OT Visit Diagnosis: Muscle weakness (generalized) (M62.81)                ADL either performed or assessed with clinical judgement  Time: 1610-9604 OT Time Calculation (min): 22 min Charges:  OT General Charges $OT Visit: 1 Procedure OT Evaluation $OT Eval Low Complexity: 1 Procedure G-Codes: OT G-codes **NOT FOR INPATIENT CLASS** Functional Assessment Tool Used: AM-PAC 6 Clicks Daily Activity;Clinical judgement Functional Limitation: Self care Self Care Current Status (V4098): At least 40 percent but less than 60 percent impaired, limited or restricted Self Care Goal Status (J1914): At least 40 percent but less than 60 percent impaired, limited or restricted Self Care Discharge Status [phone]): At least 40 percent but less than 60 percent impaired, limited or restricted   Marica Otter, OTR/L [phone] 03/17/2016  Justin Young 03/17/2016, 9:15 AM

## 2016-03-17 NOTE — Op Note (Signed)
Justin Young, Justin Young               ACCOUNT NO.:  [id number]  MEDICAL RECORD NO.:  [id number]  LOCATION:                                 FACILITY:  PHYSICIAN:  Georges Lynch. Karima Carrell, M.D.DATE OF BIRTH:  06/28/1960  DATE OF PROCEDURE:  03/16/2016 DATE OF DISCHARGE:                              OPERATIVE REPORT   SURGEON:  Georges Lynch. Darrelyn Hillock, M.D.  ASSISTANT:  Dimitri Ped, P.A.  PREOPERATIVE DIAGNOSES: 1. Severe spinal stenosis __________ complete block at L4-5. 2. Foraminal stenosis involving the L4 root on the right. 3. Foraminal stenosis involving the L5 root on the right. 4. Partial footdrop on the right.  POSTOPERATIVE DIAGNOSES: 1. Severe spinal stenosis __________ complete block at L4-5. 2. Foraminal stenosis involving the L4 root on the right. 3. Foraminal stenosis involving the L5 root on the right. 4. Partial footdrop on the right.  OPERATION: 1. Central decompressive lumbar laminectomy for spinal stenosis, at L4-     5. 2. Foraminotomy for the L4 root on the right. 3. Foraminotomy for the L5 root on the right. 4. Exploration of the disc space at L4-5 on the right.  DESCRIPTION OF PROCEDURE:  Under general anesthesia with the patient on spinal frame, routine orthopedic prep and draping of the back was carried out.  The appropriate time-out was carried out.  I also marked the appropriate right side of the back, even though we went central, I marked the right side of the back that is where the footdrop was and I did that preop.  This time after the time-out, I placed 2 needles in the back for localization purposes.  X-ray was taken.  At this time, an incision then was made over the L4-5 interspace.  It was extended proximally and distally.  We then cauterized the veins in that area.  We then went down and inserted self-retaining retractors.  I then made an incision in the lumbodorsal fascia.  We separated the muscle from the L4- 5 space as well as somewhat above and  below all the way down to the lamina.  Another x-ray was taken with Kocher clamps in place.  Following that, we then began our decompression after inserting the [employer] retractors.  We then removed the spinous process of L4 and then did a central decompressive lumbar laminectomy.  The area was quite tight __________ the ligamentum flavum was very thickened.  As we proceeded with our laminectomy, the microscope was brought in.  A Penfield retractor was placed in the space where we __________ area and x-ray was taken once again to verify the position.  We then with the microscope utilization, removed the ligamentum flavum in the usual fashion with great care taken to protect the underlying dura.  Note, the recesses were quite tight, especially on the right.  At this time, we gently retracted the dura and we examined the disc and this was intact.  I then completed my decompression by going proximally and laterally until we could easily insert a hockey-stick proximally and laterally.  I then went out into the foramen for the L4 root and the L5 root and decompressed that by foraminotomies.  We thoroughly irrigated out the area,  loosely applied some Gelfoam and then closed the wound layers in usual fashion.  To begin the case, I injected 20 mL of 0.5% Marcaine with epinephrine in for bleeding purposes.  At the end of the case, we injected 20 mL of Exparel into the soft tissue.  We left a small deep distal and proximal part of the wound open for drainage purposes. Remaining part of the subcu was closed in usual fashion skin with metal staples.  The sterile dressings were applied.  The patient left the operating room in satisfactory condition.          ______________________________ Georges Lynchonald A. Darrelyn HillockGioffre, M.D.     RAG/MEDQ  D:  03/16/2016  T:  03/16/2016  Job:  [id number]

## 2016-03-21 NOTE — Discharge Summary (Signed)
Physician Discharge Summary   Patient ID: Justin Young MRN: [id number] DOB/AGE: [birth year] 56 y.o.  Admit date: 03/16/2016 Discharge date: 03/17/2016  Primary Diagnosis: Lumbar spinal stenosis  Admission Diagnoses:  Past Medical History:  Diagnosis Date  . Arthritis   . GERD (gastroesophageal reflux disease)    mild -rolaids if necessary  . Hypertension   . Insomnia   . Spinal stenosis at L4-L5 level    Discharge Diagnoses:   Active Problems:   Spinal stenosis, lumbar region with neurogenic claudication  Estimated body mass index is 32.47 kg/m as calculated from the following:   Height as of this encounter: '5\' 8"'  (1.727 m).   Weight as of this encounter: 96.9 kg (213 lb 9 oz).  Procedure:  Procedure(s) (LRB): CENTRAL DECOMPRESSIVE LUMBAR LAMINECTOMY LUMBAR FOUR TO LUMBAR FIVE (N/A)   Consults: None  HPI: The patient presented with the chief complaint of low back pain with radiation of pain into the right lower extremity. Motor deficits were identified in the right lower extremity. 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 He did not improve with conservative treatments including cortisone injections and oral corticosteroids.   Laboratory Data: Hospital Outpatient Visit on 03/09/2016  Component Date Value Ref Range Status  . aPTT 03/09/2016 33  24 - 36 seconds Final  . WBC 03/09/2016 6.2  4.0 - 10.5 K/uL Final  . RBC 03/09/2016 4.69  4.22 - 5.81 MIL/uL Final  . Hemoglobin 03/09/2016 14.3  13.0 - 17.0 g/dL Final  . HCT 03/09/2016 42.6  39.0 - 52.0 % Final  . MCV 03/09/2016 90.8  78.0 - 100.0 fL Final  . MCH 03/09/2016 30.5  26.0 - 34.0 pg Final  . MCHC 03/09/2016 33.6  30.0 - 36.0 g/dL Final  . RDW 03/09/2016 13.7  11.5 - 15.5 % Final  . Platelets 03/09/2016 183  150 - 400 K/uL Final  . Neutrophils Relative % 03/09/2016 49  % Final  . Neutro Abs 03/09/2016 3.1  1.7 - 7.7 K/uL Final  . Lymphocytes  Relative 03/09/2016 36  % Final  . Lymphs Abs 03/09/2016 2.2  0.7 - 4.0 K/uL Final  . Monocytes Relative 03/09/2016 9  % Final  . Monocytes Absolute 03/09/2016 0.6  0.1 - 1.0 K/uL Final  . Eosinophils Relative 03/09/2016 5  % Final  . Eosinophils Absolute 03/09/2016 0.3  0.0 - 0.7 K/uL Final  . Basophils Relative 03/09/2016 1  % Final  . Basophils Absolute 03/09/2016 0.0  0.0 - 0.1 K/uL Final  . Sodium 03/09/2016 138  135 - 145 mmol/L Final  . Potassium 03/09/2016 4.3  3.5 - 5.1 mmol/L Final  . Chloride 03/09/2016 105  101 - 111 mmol/L Final  . CO2 03/09/2016 25  22 - 32 mmol/L Final  . Glucose, Bld 03/09/2016 97  65 - 99 mg/dL Final  . BUN 03/09/2016 23* 6 - 20 mg/dL Final  . Creatinine, Ser 03/09/2016 0.89  0.61 - 1.24 mg/dL Final  . Calcium 03/09/2016 9.3  8.9 - 10.3 mg/dL Final  . Total Protein 03/09/2016 7.3  6.5 - 8.1 g/dL Final  . Albumin 03/09/2016 4.4  3.5 - 5.0 g/dL Final  . AST 03/09/2016 21  15 - 41 U/L Final  . ALT 03/09/2016 22  17 - 63 U/L Final  . Alkaline Phosphatase 03/09/2016 61  38 - 126 U/L Final  . Total Bilirubin 03/09/2016 0.5  0.3 - 1.2 mg/dL Final  .  GFR calc non Af Amer 03/09/2016 >60  >60 mL/min Final  . GFR calc Af Amer 03/09/2016 >60  >60 mL/min Final   Comment: (NOTE) The eGFR has been calculated using the CKD EPI equation. This calculation has not been validated in all clinical situations. eGFR's persistently <60 mL/min signify possible Chronic Kidney Disease.   . Anion gap 03/09/2016 8  5 - 15 Final  . Prothrombin Time 03/09/2016 11.8  11.4 - 15.2 seconds Final  . INR 03/09/2016 0.87   Final  . ABO/RH(D) 03/09/2016 O POS   Final  . Antibody Screen 03/09/2016 NEG   Final  . Sample Expiration 03/09/2016 03/19/2016   Final  . Extend sample reason 03/09/2016 NO TRANSFUSIONS OR PREGNANCY IN THE PAST 3 MONTHS   Final  . Color, Urine 03/09/2016 YELLOW  YELLOW Final  . APPearance 03/09/2016 CLEAR  CLEAR Final  . Specific Gravity, Urine 03/09/2016 1.027   1.005 - 1.030 Final  . pH 03/09/2016 5.0  5.0 - 8.0 Final  . Glucose, UA 03/09/2016 NEGATIVE  NEGATIVE mg/dL Final  . Hgb urine dipstick 03/09/2016 NEGATIVE  NEGATIVE Final  . Bilirubin Urine 03/09/2016 NEGATIVE  NEGATIVE Final  . Ketones, ur 03/09/2016 NEGATIVE  NEGATIVE mg/dL Final  . Protein, ur 03/09/2016 NEGATIVE  NEGATIVE mg/dL Final  . Nitrite 03/09/2016 NEGATIVE  NEGATIVE Final  . Leukocytes, UA 03/09/2016 NEGATIVE  NEGATIVE Final  . MRSA, PCR 03/09/2016 NEGATIVE  NEGATIVE Final  . Staphylococcus aureus 03/09/2016 NEGATIVE  NEGATIVE Final   Comment:        The Xpert SA Assay (FDA approved for NASAL specimens in patients over 38 years of age), is one component of a comprehensive surveillance program.  Test performance has been validated by [facility] for patients greater than or equal to 58 year old. It is not intended to diagnose infection nor to guide or monitor treatment.   . ABO/RH(D) 03/09/2016 O POS   Final     X-Rays:Dg Chest 2 View  Result Date: 03/09/2016 CLINICAL DATA:  Preoperative examination prior to lumbar surgery. No current complaints. History of hypertension. Former smoker. EXAM: CHEST  2 VIEW COMPARISON:  Report of a chest x-ray of January 23, 2003 FINDINGS: The lungs are well-expanded and clear. The heart and pulmonary vascularity are normal. The mediastinum is normal in width. There is calcification in the wall of the aortic arch. There is no pleural effusion. The thoracic vertebral bodies are preserved in height. There are 12 pairs of ribs. IMPRESSION: Mild hyperinflation may be normal for the patient or may reflect chronic bronchitic/ smoking related changes or reactive airway disease. No pneumonia, CHF, nor other acute cardiopulmonary abnormality. Thoracic aortic atherosclerosis. Electronically Signed   By: David  Martinique M.D.   On: 03/09/2016 10:33   Dg Lumbar Spine 2-3 Views  Result Date: 03/09/2016 CLINICAL DATA:  Preoperative clearance. Back pain.  Please number vertebra. EXAM: LUMBAR SPINE - 2-3 VIEW COMPARISON:  CT myelogram lumbar spine 02/09/2016 FINDINGS: Lowest disc space L5-S1 consistent with the myelogram. Vertebral levels were labeled for preoperative planning. 4 mm anterolisthesis L4-5 has developed since the prior study. This level may be unstable. Flexion-extension views suggested. Mild disc space narrowing and mild spurring at L2-3, L3-4, and L4-5. Negative for fracture or pars defect. IMPRESSION: Grade 1 anterolisthesis L4-5 has developed since the prior CT myelogram. This level may be unstable and flexion-extension views are suggested. Mild lumbar disc degeneration and spurring. No acute skeletal abnormality. Electronically Signed   By: Franchot Gallo  M.D.   On: 03/09/2016 10:56   Dg Spine Portable 1 View  Result Date: 03/16/2016 CLINICAL DATA:  Intraoperative localization film for patient undergoing lumbar surgery. EXAM: PORTABLE SPINE - 1 VIEW COMPARISON:  Intraoperative localization film earlier today. FINDINGS: Single lateral intraoperative view of the lumbar spine demonstrates a probe at the level of the L4-5 disc interspace. IMPRESSION: As above. Electronically Signed   By: Inge Rise M.D.   On: 03/16/2016 12:35   Dg Spine Portable 1 View  Result Date: 03/16/2016 CLINICAL DATA:  Lumbar surgery. EXAM: PORTABLE SPINE - 1 VIEW COMPARISON:  03/16/2016 . FINDINGS: Lumbar vertebra numbered as per prior exam. Metallic markers noted posteriorly at the L3-L4 and L4-L5 levels. IMPRESSION: Surgical localization as above. Electronically Signed   By: Marcello Moores  Register   On: 03/16/2016 12:08   Dg Spine Portable 1 View  Result Date: 03/16/2016 CLINICAL DATA:  Lumbar surgery. EXAM: PORTABLE SPINE - 1 VIEW COMPARISON:  Radiographs of March 09, 2016. FINDINGS: Surgical probes are seen positioned over the posterior spinous processes of L3 and L4. Aortic atherosclerosis is noted. IMPRESSION: Surgical localization as described above.  Electronically Signed   By: Marijo Conception, M.D.   On: 03/16/2016 11:43    EKG: Orders placed or performed during the hospital encounter of 03/09/16  . EKG  . EKG     Hospital Course: KDYN VONBEHREN is a 56 y.o. who was admitted to [facility]. They were brought to the operating room on 03/16/2016 and underwent Procedure(s): [place].  Patient tolerated the procedure well and was later transferred to the recovery room and then to the orthopaedic floor for postoperative care.  They were given PO and IV analgesics for pain control following their surgery.  They were given 24 hours of postoperative antibiotics of  Anti-infectives    Start     Dose/Rate Route Frequency Ordered Stop   03/16/16 2000  ceFAZolin (ANCEF) IVPB 1 g/50 mL premix     1 g 100 mL/hr over 30 Minutes Intravenous Every 8 hours 03/16/16 1502 03/17/16 1233   03/16/16 1207  polymyxin B 500,000 Units, bacitracin 50,000 Units in sodium chloride irrigation 0.9 % 500 mL irrigation  Status:  Discontinued       As needed 03/16/16 1207 03/16/16 1334   03/16/16 0843  ceFAZolin (ANCEF) IVPB 2g/100 mL premix     2 g 200 mL/hr over 30 Minutes Intravenous On call to O.R. 03/16/16 8921 03/16/16 1113     and started on DVT prophylaxis in the form of Aspirin.   PT was ordered. Discharge planning consulted to help with postop disposition and equipment needs.  Patient had a fair night on the evening of surgery.  They started to get up OOB with therapy on day one.  Dressing was changed and the incision was clean and dry.  The patient had progressed with therapy and meeting their goals.  Incision was healing well.  Patient was seen in rounds and was ready to go home.   Diet: Cardiac diet Activity:WBAT Follow-up:in 2 weeks Disposition - Home Discharged Condition: stable   Discharge Instructions    Call MD / Call 911    Complete by:  As directed    If you experience chest  pain or shortness of breath, CALL 911 and be transported to the hospital emergency room.  If you develope a fever above 101 F, pus (white drainage) or increased drainage or redness at  the wound, or calf pain, call your surgeon's office.   Constipation Prevention    Complete by:  As directed    Drink plenty of fluids.  Prune juice may be helpful.  You may use a stool softener, such as Colace (over the counter) 100 mg twice a day.  Use MiraLax (over the counter) for constipation as needed.   Diet - low sodium heart healthy    Complete by:  As directed    Discharge instructions    Complete by:  As directed    For the first three days, remove your dressing and tape a piece of saran wrap over your incision Take your shower, then remove the saran wrap and put a clean dressing on. After three days you can shower without the saran wrap.  No lifting or bending. No driving while taking pain medications. Call Dr. Gladstone Lighter if any wound complications or temperature of 101 degrees F or over.  Call the office for an appointment to see Dr. Gladstone Lighter in two weeks: [phone] and ask for Dr. Charlestine Night nurse, Brunilda Payor.   Increase activity slowly as tolerated    Complete by:  As directed      Allergies as of 03/17/2016      Reactions   Sulfa Antibiotics Other (See Comments)   From when younger.   Codeine Itching      Medication List    TAKE these medications   amLODipine 5 MG tablet Commonly known as:  NORVASC Take 5 mg by mouth daily.   aspirin EC 325 MG tablet Take 1 tablet (325 mg total) by mouth daily.   HYDROmorphone 2 MG tablet Commonly known as:  DILAUDID Take 1 tablet (2 mg total) by mouth every 3 (three) hours as needed for moderate pain.   ibuprofen 200 MG tablet Commonly known as:  ADVIL,MOTRIN Take 800 mg by mouth 2 (two) times daily as needed (for pain.).   loratadine 10 MG tablet Commonly known as:  CLARITIN Take 10 mg by mouth daily.   methocarbamol 500 MG  tablet Commonly known as:  ROBAXIN Take 1 tablet (500 mg total) by mouth every 6 (six) hours as needed for muscle spasms.   metoprolol 50 MG tablet Commonly known as:  LOPRESSOR Take 50 mg by mouth 2 (two) times daily.      Follow-up Information    GIOFFRE,RONALD A, MD. Schedule an appointment as soon as possible for a visit in 2 week(s).   Specialty:  Orthopedic Surgery Contact information: [address] [place] [address] [phone]           Signed: Ardeen Jourdain, PA-C Orthopaedic Surgery 03/21/2016, 9:04 AM

## 2016-06-20 NOTE — Addendum Note (Signed)
Addendum  created 06/20/16 1142 by Karel Turpen, MD   Sign clinical note    

## 2017-01-06 ENCOUNTER — Observation Stay (HOSPITAL_COMMUNITY): Payer: BLUE CROSS/BLUE SHIELD

## 2017-01-06 ENCOUNTER — Observation Stay (HOSPITAL_COMMUNITY)
Admission: EM | Admit: 2017-01-06 | Discharge: 2017-01-06 | Disposition: A | Discharge status: Home / Self Care | Payer: BLUE CROSS/BLUE SHIELD | Attending: Internal Medicine | Admitting: Internal Medicine

## 2017-01-06 ENCOUNTER — Encounter (HOSPITAL_COMMUNITY): Payer: Self-pay | Admitting: Emergency Medicine

## 2017-01-06 ENCOUNTER — Emergency Department (HOSPITAL_COMMUNITY): Payer: BLUE CROSS/BLUE SHIELD

## 2017-01-06 ENCOUNTER — Observation Stay (HOSPITAL_BASED_OUTPATIENT_CLINIC_OR_DEPARTMENT_OTHER): Payer: BLUE CROSS/BLUE SHIELD

## 2017-01-06 DIAGNOSIS — R4189 Other symptoms and signs involving cognitive functions and awareness: Secondary | ICD-10-CM | POA: Diagnosis present

## 2017-01-06 DIAGNOSIS — Z79899 Other long term (current) drug therapy: Secondary | ICD-10-CM | POA: Diagnosis not present

## 2017-01-06 DIAGNOSIS — I361 Nonrheumatic tricuspid (valve) insufficiency: Secondary | ICD-10-CM | POA: Diagnosis not present

## 2017-01-06 DIAGNOSIS — I10 Essential (primary) hypertension: Secondary | ICD-10-CM | POA: Diagnosis present

## 2017-01-06 DIAGNOSIS — R55 Syncope and collapse: Principal | ICD-10-CM | POA: Insufficient documentation

## 2017-01-06 DIAGNOSIS — Z87891 Personal history of nicotine dependence: Secondary | ICD-10-CM | POA: Diagnosis not present

## 2017-01-06 DIAGNOSIS — R079 Chest pain, unspecified: Secondary | ICD-10-CM | POA: Diagnosis present

## 2017-01-06 HISTORY — DX: Chest pain, unspecified: R07.9

## 2017-01-06 HISTORY — DX: Syncope and collapse: R55

## 2017-01-06 LAB — URINALYSIS, ROUTINE W REFLEX MICROSCOPIC
Bilirubin Urine: NEGATIVE
GLUCOSE, UA: NEGATIVE mg/dL
Hgb urine dipstick: NEGATIVE
Ketones, ur: NEGATIVE mg/dL
LEUKOCYTES UA: NEGATIVE
NITRITE: NEGATIVE
PH: 7 (ref 5.0–8.0)
Protein, ur: NEGATIVE mg/dL
SPECIFIC GRAVITY, URINE: 1.01 (ref 1.005–1.030)

## 2017-01-06 LAB — TSH: TSH: 1.242 u[IU]/mL (ref 0.350–4.500)

## 2017-01-06 LAB — CBC WITH DIFFERENTIAL/PLATELET
BASOS PCT: 0 %
Basophils Absolute: 0 10*3/uL (ref 0.0–0.1)
EOS ABS: 0.3 10*3/uL (ref 0.0–0.7)
EOS PCT: 5 %
HCT: 40.1 % (ref 39.0–52.0)
HEMOGLOBIN: 13.5 g/dL (ref 13.0–17.0)
Lymphocytes Relative: 36 %
Lymphs Abs: 2.4 10*3/uL (ref 0.7–4.0)
MCH: 31 pg (ref 26.0–34.0)
MCHC: 33.7 g/dL (ref 30.0–36.0)
MCV: 92.2 fL (ref 78.0–100.0)
MONO ABS: 0.5 10*3/uL (ref 0.1–1.0)
MONOS PCT: 8 %
NEUTROS PCT: 51 %
Neutro Abs: 3.4 10*3/uL (ref 1.7–7.7)
Platelets: 186 10*3/uL (ref 150–400)
RBC: 4.35 MIL/uL (ref 4.22–5.81)
RDW: 14 % (ref 11.5–15.5)
WBC: 6.6 10*3/uL (ref 4.0–10.5)

## 2017-01-06 LAB — BASIC METABOLIC PANEL
Anion gap: 9 (ref 5–15)
BUN: 17 mg/dL (ref 6–20)
CALCIUM: 9 mg/dL (ref 8.9–10.3)
CO2: 21 mmol/L — AB (ref 22–32)
CREATININE: 0.78 mg/dL (ref 0.61–1.24)
Chloride: 107 mmol/L (ref 101–111)
GFR calc non Af Amer: >60 mL/min (ref >60)
Glucose, Bld: 121 mg/dL — ABNORMAL HIGH (ref 65–99)
Potassium: 4 mmol/L (ref 3.5–5.1)
Sodium: 137 mmol/L (ref 135–145)

## 2017-01-06 LAB — RAPID URINE DRUG SCREEN, HOSP PERFORMED
AMPHETAMINES: NOT DETECTED
BARBITURATES: NOT DETECTED
BENZODIAZEPINES: NOT DETECTED
COCAINE: NOT DETECTED
Opiates: NOT DETECTED
TETRAHYDROCANNABINOL: POSITIVE — AB

## 2017-01-06 LAB — PROTIME-INR
INR: 0.96
PROTHROMBIN TIME: 12.6 s (ref 11.4–15.2)

## 2017-01-06 LAB — ECHOCARDIOGRAM COMPLETE

## 2017-01-06 LAB — CBG MONITORING, ED: Glucose-Capillary: 115 mg/dL — ABNORMAL HIGH (ref 65–99)

## 2017-01-06 LAB — APTT: aPTT: 23 seconds — ABNORMAL LOW (ref 24–36)

## 2017-01-06 LAB — D-DIMER, QUANTITATIVE: D-Dimer, Quant: 0.62 ug/mL-FEU — ABNORMAL HIGH (ref 0.00–0.50)

## 2017-01-06 LAB — TROPONIN I: Troponin I: <0.03 ng/mL (ref <0.03)

## 2017-01-06 LAB — GLUCOSE, CAPILLARY: Glucose-Capillary: 101 mg/dL — ABNORMAL HIGH (ref 65–99)

## 2017-01-06 LAB — HIV ANTIBODY (ROUTINE TESTING W REFLEX): HIV Screen 4th Generation wRfx: NONREACTIVE

## 2017-01-06 MED ORDER — ONDANSETRON HCL 4 MG/2ML IJ SOLN: 4.0000 mg | Freq: Four times a day (QID) | INTRAMUSCULAR | Status: DC | PRN

## 2017-01-06 MED ORDER — ACETAMINOPHEN 650 MG RE SUPP: 650.0000 mg | Freq: Four times a day (QID) | RECTAL | Status: DC | PRN

## 2017-01-06 MED ORDER — IBUPROFEN 400 MG PO TABS
400.0000 mg | ORAL_TABLET | Freq: Four times a day (QID) | ORAL | Status: DC | PRN
  Filled 2017-01-06: qty 1

## 2017-01-06 MED ORDER — IOPAMIDOL (ISOVUE-370) INJECTION 76%
INTRAVENOUS | Status: AC
  Administered 2017-01-06: 100 mL
  Filled 2017-01-06: qty 100

## 2017-01-06 MED ORDER — ONDANSETRON HCL 4 MG PO TABS: 4.0000 mg | ORAL_TABLET | Freq: Four times a day (QID) | ORAL | Status: DC | PRN

## 2017-01-06 MED ORDER — LACTATED RINGERS IV BOLUS (SEPSIS)
1000.0000 mL | Freq: Once | INTRAVENOUS | Status: AC
  Administered 2017-01-06: 1000 mL via INTRAVENOUS

## 2017-01-06 MED ORDER — LORATADINE 10 MG PO TABS
10.0000 mg | ORAL_TABLET | Freq: Every day | ORAL | Status: DC
  Administered 2017-01-06: 10 mg via ORAL
  Filled 2017-01-06: qty 1

## 2017-01-06 MED ORDER — METHOCARBAMOL 500 MG PO TABS: 500.0000 mg | ORAL_TABLET | Freq: Four times a day (QID) | ORAL | Status: DC | PRN

## 2017-01-06 MED ORDER — ALUM & MAG HYDROXIDE-SIMETH 200-200-20 MG/5ML PO SUSP: 30.0000 mL | Freq: Four times a day (QID) | ORAL | Status: DC | PRN

## 2017-01-06 MED ORDER — BISACODYL 5 MG PO TBEC: 5.0000 mg | DELAYED_RELEASE_TABLET | Freq: Every day | ORAL | Status: DC | PRN

## 2017-01-06 MED ORDER — SODIUM CHLORIDE 0.9% FLUSH
3.0000 mL | Freq: Two times a day (BID) | INTRAVENOUS | Status: DC
  Administered 2017-01-06: 3 mL via INTRAVENOUS

## 2017-01-06 MED ORDER — TRAZODONE HCL 50 MG PO TABS: 25.0000 mg | ORAL_TABLET | Freq: Every evening | ORAL | Status: DC | PRN

## 2017-01-06 MED ORDER — METOPROLOL TARTRATE 25 MG PO TABS
50.0000 mg | ORAL_TABLET | Freq: Two times a day (BID) | ORAL | Status: DC
  Administered 2017-01-06: 50 mg via ORAL
  Filled 2017-01-06: qty 2

## 2017-01-06 MED ORDER — PANTOPRAZOLE SODIUM 40 MG PO TBEC
40.0000 mg | DELAYED_RELEASE_TABLET | Freq: Every day | ORAL | Status: DC
  Filled 2017-01-06: qty 1

## 2017-01-06 MED ORDER — SODIUM CHLORIDE 0.9 % IV SOLN: INTRAVENOUS | Status: DC

## 2017-01-06 MED ORDER — ENOXAPARIN SODIUM 40 MG/0.4ML ~~LOC~~ SOLN
40.0000 mg | SUBCUTANEOUS | Status: DC
  Administered 2017-01-06: 40 mg via SUBCUTANEOUS
  Filled 2017-01-06: qty 0.4

## 2017-01-06 MED ORDER — AMLODIPINE BESYLATE 5 MG PO TABS
5.0000 mg | ORAL_TABLET | Freq: Every day | ORAL | Status: DC
  Administered 2017-01-06: 5 mg via ORAL
  Filled 2017-01-06: qty 1

## 2017-01-06 MED ORDER — ACETAMINOPHEN 325 MG PO TABS: 650.0000 mg | ORAL_TABLET | Freq: Four times a day (QID) | ORAL | Status: DC | PRN

## 2017-01-06 NOTE — Progress Notes (Signed)
  Echocardiogram 2D Echocardiogram has been performed.  Justin Young Justin Young 01/06/2017, 11:02 AM

## 2017-01-06 NOTE — ED Provider Notes (Signed)
[facility] Provider Note   CSN: [id number] Arrival date & time: 01/06/17  0114     History   Chief Complaint Chief Complaint  Patient presents with  . Loss of Consciousness    HPI Justin Young is a 56 y.o. male.  HPI 56 year old male comes in after syncope. Patient has no significant medical history.  Patient reports that he was sitting in the couch watching TV, waiting for his wife when he suddenly started feeling unwell.  Patient started getting clammy and then diaphoretic, and then he started feeling that he might faint.  Patient believes that he actually might have fainted.  At one point he did start feeling like he was having chest tightness and some shortness of breath.  Patient denies any palpitations.  Patient has no cardiac disease history.  He denies any heavy smoking, substance abuse, heavy drinking.  There is no premature CAD in the family or sudden unexpected deaths.  Patient has no history of PE or DVT, but he does report that over the last 3 months he has made multiple long distance driving trips, including [place] from [place] to [place] to [place] within 3 days earlier in the week.   Past Medical History:  Diagnosis Date  . Arthritis   . GERD (gastroesophageal reflux disease)    mild -rolaids if necessary  . Hypertension   . Insomnia   . Spinal stenosis at L4-L5 level     Patient Active Problem List   Diagnosis Date Noted  . Spinal stenosis, lumbar region with neurogenic claudication 03/16/2016  . Tick bite 07/24/2015  . Viral illness 07/24/2015  . Eustachian tube dysfunction 05/28/2015  . Hearing loss 05/28/2015  . Routine general medical examination at a health care facility 09/09/2014  . Rib pain on right side 09/09/2014  . History of colon polyps 03/08/2012  . Nonspecific (abnormal) findings on radiological and other examination of gastrointestinal tract 03/07/2012  . Essential  hypertension 02/25/2012  . Osteoarthritis of left knee 01/27/2012    Past Surgical History:  Procedure Laterality Date  . ANKLE RECONSTRUCTION  left   3 procedures total  . CLAVICLE SURGERY     bilateral  . KNEE ARTHROSCOPY  2006-most current   left knee X 2 1 ON RIGHT  . KNEE ARTHROSCOPY WITH MEDIAL MENISECTOMY  01/27/2012   Procedure: KNEE ARTHROSCOPY WITH MEDIAL MENISECTOMY;  Surgeon: Jacki Conesonald A Gioffre, MD;  [facility];  Service: Orthopedics;  Laterality: Left;  . LUMBAR LAMINECTOMY/DECOMPRESSION MICRODISCECTOMY N/A 03/16/2016   Procedure: CENTRAL DECOMPRESSIVE LUMBAR LAMINECTOMY LUMBAR FOUR TO LUMBAR FIVE;  Surgeon: Ranee Gosselinonald Gioffre, MD;  Location: [facility];  Service: Orthopedics;  Laterality: N/A;  . TONSILLECTOMY  AS CHILD       Home Medications    Prior to Admission medications   Medication Sig Start Date End Date Taking? Authorizing Provider  amLODipine (NORVASC) 5 MG tablet Take 5 mg by mouth daily. 02/18/16  Yes [provider]  ibuprofen (ADVIL,MOTRIN) 200 MG tablet Take 800 mg by mouth 2 (two) times daily as needed (for pain.).   Yes [provider]  loratadine (CLARITIN) 10 MG tablet Take 10 mg by mouth daily.    Yes [provider]  methocarbamol (ROBAXIN) 500 MG tablet Take 1 tablet (500 mg total) by mouth every 6 (six) hours as needed for muscle spasms. 03/16/16  Yes Ranee GosselinGioffre, Ronald, MD  metoprolol (LOPRESSOR) 50 MG tablet Take 50 mg by mouth  2 (two) times daily.  11/16/15  Yes [provider]  omeprazole (PRILOSEC) 20 MG capsule Take 20 mg by mouth daily as needed (acid reflux).   Yes [provider]    Family History Family History  Problem Relation Age of Onset  . Arthritis Mother   . Cancer Mother        Ovarian cancer primary,colon cancer.  . Diabetes Mother   . Colon cancer Mother   . Alcohol abuse Father   . Hyperlipidemia Father   . Hypertension Father   . Heart disease Father   . Heart  disease Sister   . Hypertension Sister   . Sleep apnea Sister   . Arthritis Maternal Grandmother     Social History Social History   Tobacco Use  . Smoking status: Former Smoker    Last attempt to quit: 01/25/2004    Years since quitting: 12.9  . Smokeless tobacco: Never Used  Substance Use Topics  . Alcohol use: Yes    Alcohol/week: 4.2 oz    Types: 7 Shots of liquor per week    Comment: occ  . Drug use: No     Allergies   Sulfa antibiotics and Codeine   Review of Systems Review of Systems  Constitutional: Positive for diaphoresis.  Respiratory: Positive for chest tightness and shortness of breath. Negative for wheezing.   Cardiovascular: Positive for chest pain.  Gastrointestinal: Negative for abdominal pain.  Neurological: Positive for syncope and light-headedness.  All other systems reviewed and are negative.    Physical Exam Updated Vital Signs BP (!) 152/88   Pulse 70   Temp 98.4 F (36.9 C) (Oral)   Resp 16   SpO2 99%   Physical Exam  Constitutional: He is oriented to person, place, and time. He appears well-developed.  HENT:  Head: Atraumatic.  Neck: Neck supple.  Cardiovascular: Normal rate and intact distal pulses.  Pulmonary/Chest: Effort normal and breath sounds normal. No respiratory distress.  Abdominal: Soft. He exhibits no distension. There is no tenderness.  Neurological: He is alert and oriented to person, place, and time.  Skin: Skin is warm.  Nursing note and vitals reviewed.    ED Treatments / Results  Labs (all labs ordered are listed, but only abnormal results are displayed) Labs Reviewed  BASIC METABOLIC PANEL - Abnormal; Notable for the following components:      Result Value   CO2 21 (*)    Glucose, Bld 121 (*)    All other components within normal limits  CBG MONITORING, ED - Abnormal; Notable for the following components:   Glucose-Capillary 115 (*)    All other components within normal limits  CBC WITH  DIFFERENTIAL/PLATELET  TROPONIN I  PROTIME-INR  APTT  D-DIMER, QUANTITATIVE (NOT AT [facility])    EKG  EKG Interpretation  Date/Time:  Friday January 06 2017 01:21:33 EST Ventricular Rate:  68 PR Interval:    QRS Duration: 109 QT Interval:  395 QTC Calculation: 421 R Axis:   71 Text Interpretation:  Sinus rhythm RSR' in V1 or V2, right VCD or RVH - is new No acute changes s1q3t3 in old ekg Confirmed by Derwood KaplanNanavati, Mendel Binsfeld [phone]([id number]) on 01/06/2017 1:37:14 AM       Radiology No results found.  Procedures Procedures (including critical care time)  Medications Ordered in ED Medications - No data to display   Initial Impression / Assessment and Plan / ED Course  I have reviewed the triage vital signs and the nursing notes.  Pertinent labs & imaging results that were available during my care of the patient were reviewed by me and considered in my medical decision making (see chart for details).     Patient comes in after syncopal episode. DDx includes: Orthostatic hypotension Stroke Vertebral artery dissection/stenosis Dysrhythmia PE Vasovagal/neurocardiogenic syncope Valvular disorder/Cardiomyopathy Anemia  History suggest that patient has a PE risk factor in the long distance traveling. EKG does indicate that patient has right-sided strain, S1 Q3 T3 although is not new.  D-dimer ordered and we will rule out a PE in the ER.  Anticipate admission.  Final Clinical Impressions(s) / ED Diagnoses   Final diagnoses:  Syncope and collapse    ED Discharge Orders    None       Derwood Kaplan, MD 01/06/17 [id number]

## 2017-01-06 NOTE — ED Notes (Signed)
Attempted to call report

## 2017-01-06 NOTE — Discharge Summary (Signed)
Physician Discharge Summary  Justin Young:[id number] DOB: May 07, 1960 DOA: 01/06/2017  PCP: [name]  Admit date: 01/06/2017 Discharge date: 01/06/2017  Time spent: 40 minutes  Recommendations for Outpatient Follow-up:  1. PCP 1-2 weeks for evaluation of symptoms and referral to pulmonology and cardiology to evaluate CT chest findings 2. Consider sleep study referral   Discharge Diagnoses:  Principal Problem:   Episode of unresponsiveness Active Problems:   Essential hypertension   Chest pain   Syncope   Discharge Condition: stable  Diet recommendation: heart healthy  There were no vitals filed for this visit.  History of present illness:  Patient stated he was in his usual state of health last evening he was sitting on the couch and all the sudden "I was overwhelmed with a feeling of illness". He described diaphoresis shortness of breath and chest pressure located "all over". He stated he was unable to call out for his wife who was upstairs but he was able to call her on his cell phone and asked her to come assist him. Wife reported he was unable to stand up and felt as though the "walls were closing in on me". Patient stated he felt like he was going to "pass out" and he felt like he did lose consciousness briefly. Wife reported she did not see patient collapse or lose consciousness. He also experienced nausea without vomiting. He denied headache dizziness abdominal pain. He denied disturbances numbness tingling of his extremities or slurred speech when he did speak. He denied any cough fever chills but did indicate he was on an airplane all  week going to [place] then [place] then back to [place].He denies leg pain/swelling.    Hospital Course:  #1. Syncope/episode of unresponsiveness. Etiology unclear. Question loss of consciousness. CT head negative. Trop neg x2. EKG without acute changes. Echo with EF 55% and grade 1 diastolic  dysfunction. CT chest negative PE with areas of ground-glass attenuation within both lungs with possible air trapping. Mild pulmonary edema might have a similar appearance, though there is no septal thickening. Neuro exam benign. Initial troponin neg. ekg without signs of ischemia. CT chest negative for PE. No metabolic derangement. Not orthostatic. Telemetry without abnormality. UDS + Tetrahydrocannabinol. EEG within limits of normal  #2. Chest pain.  troponin negative. EKG without acute changes. Pain-free on admission. He did have an elevated d-dimer. CT of the chest negative for PE  -#3. Hypertension. Controlled in the emergency department Home medications include Norvasc and metoprolol. Patient not orthostatic on admission -Continue home meds -monitor   Procedures:  echocardiogram  Consultations:  none  Discharge Exam: Vitals:   01/06/17 0650 01/06/17 1017  BP: (!) 141/78 (!) 166/93  Pulse: 67 64  Resp: 20 19  Temp: (!) 97.5 F (36.4 C) 97.9 F (36.6 C)  SpO2: 95% 96%    General: well nourished alert no acute distress Cardiovascular: RRR NO MGR No LE edema Respiratory: normal effort BS clear  Discharge Instructions   Discharge Instructions    Diet - low sodium heart healthy   Complete by:  As directed    Increase activity slowly   Complete by:  As directed      Allergies as of 01/06/2017      Reactions   Sulfa Antibiotics Other (See Comments)   From when younger.   Codeine Itching      Medication List    TAKE these medications   amLODipine 5 MG tablet Commonly known as:  NORVASC Take 5 mg by mouth daily.   ibuprofen 200 MG tablet Commonly known as:  ADVIL,MOTRIN Take 800 mg by mouth 2 (two) times daily as needed (for pain.).   loratadine 10 MG tablet Commonly known as:  CLARITIN Take 10 mg by mouth daily.   methocarbamol 500 MG tablet Commonly known as:  ROBAXIN Take 1 tablet (500 mg total) by mouth every 6 (six) hours as needed for muscle  spasms.   metoprolol tartrate 50 MG tablet Commonly known as:  LOPRESSOR Take 50 mg by mouth 2 (two) times daily.   omeprazole 20 MG capsule Commonly known as:  PRILOSEC Take 20 mg by mouth daily as needed (acid reflux).      Allergies  Allergen Reactions  . Sulfa Antibiotics Other (See Comments)    From when younger.  . Codeine Itching   Follow-up Information    [place] Follow up in 1 week(s).   Specialty:  Family Medicine Why:  for hospital discharge follow up. pmd consider 1. outpatient sleep study 2. refer to cardiology for outpatient cardiac monitoring, 3. refer to pulmonology for bronchitis findins on cta, 4. refer to neurology for seizure eval. Contact information: [address] [place] [id number] [phone]            The results of significant diagnostics from this hospitalization (including imaging, microbiology, ancillary and laboratory) are listed below for reference.    Significant Diagnostic Studies: Ct Head Wo Contrast  Result Date: 01/06/2017 CLINICAL DATA:  Syncope. EXAM: CT HEAD WITHOUT CONTRAST TECHNIQUE: Contiguous axial images were obtained from the base of the skull through the vertex without intravenous contrast. COMPARISON:  None available currently. FINDINGS: Brain: No evidence of acute infarction, hemorrhage, hydrocephalus, extra-axial collection or mass lesion/mass effect. Vascular: No hyperdense vessel or unexpected calcification. Skull: Normal. Negative for fracture or focal lesion. Sinuses/Orbits: No acute finding. Other: None. IMPRESSION: Normal head CT. Electronically Signed   By: Lupita RaiderJames  Green Jr, M.D.   On: 01/06/2017 09:13   Ct Angio Chest Pe W And/or Wo Contrast  Result Date: 01/06/2017 CLINICAL DATA:  Positive D-dimer. Shortness of breath and diaphoresis. EXAM: CT ANGIOGRAPHY CHEST WITH CONTRAST TECHNIQUE: Multidetector CT imaging of the chest was performed using the standard protocol  during bolus administration of intravenous contrast. Multiplanar CT image reconstructions and MIPs were obtained to evaluate the vascular anatomy. CONTRAST:  100mL ISOVUE-370 IOPAMIDOL (ISOVUE-370) INJECTION 76% COMPARISON:  Chest radiograph 03/09/2016 FINDINGS: Cardiovascular: Contrast injection is sufficient to demonstrate satisfactory opacification of the pulmonary arteries to the segmental level. There is no pulmonary embolus. The main pulmonary artery is within normal limits for size. There is no CT evidence of acute right heart strain. Mild calcific aortic atherosclerosis. There is a normal 3-vessel arch branching pattern. Heart size is normal, without pericardial effusion. Mediastinum/Nodes: No mediastinal, hilar or axillary lymphadenopathy. The visualized thyroid and thoracic esophageal course are unremarkable. Lungs/Pleura: Multifocal air-trapping and ground-glass attenuation. Mild bibasilar atelectasis. No focal consolidation. No pleural effusion or pneumothorax. No pulmonary nodules or masses. Upper Abdomen: Contrast bolus timing is not optimized for evaluation of the abdominal organs. Within this limitation, the visualized organs of the upper abdomen are normal. Musculoskeletal: No chest wall abnormality. No acute or significant osseous findings. Review of the MIP images confirms the above findings. IMPRESSION: 1. No pulmonary embolus or other acute thoracic abnormality. 2. Areas of ground-glass attenuation within both lungs with possible air trapping. 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 Mild pulmonary edema  might have a similar appearance, though there is no septal thickening. Electronically Signed   By: Deatra RobinsonKevin  Herman M.D.   On: 01/06/2017 03:58    Microbiology: No results found for this or any previous visit (from the past 240 hour(s)).   Labs: Basic Metabolic Panel: Recent Labs  Lab 01/06/17 0123  NA 137  K 4.0  CL 107  CO2 21*  GLUCOSE 121*  BUN 17  CREATININE 0.78   CALCIUM 9.0   Liver Function Tests: No results for input(s): AST, ALT, ALKPHOS, BILITOT, PROT, ALBUMIN in the last 168 hours. No results for input(s): LIPASE, AMYLASE in the last 168 hours. No results for input(s): AMMONIA in the last 168 hours. CBC: Recent Labs  Lab 01/06/17 0123  WBC 6.6  NEUTROABS 3.4  HGB 13.5  HCT 40.1  MCV 92.2  PLT 186   Cardiac Enzymes: Recent Labs  Lab 01/06/17 0123 01/06/17 0731  TROPONINI <0.03 <0.03   BNP: BNP (last 3 results) No results for input(s): BNP in the last 8760 hours.  ProBNP (last 3 results) No results for input(s): PROBNP in the last 8760 hours.  CBG: Recent Labs  Lab 01/06/17 0154 01/06/17 0811  GLUCAP 115* 101*       Signed:  Gwenyth BenderBLACK,KAREN M MD.  [facility] 01/06/2017, 1:40 PM

## 2017-01-06 NOTE — H&P (Signed)
History and Physical    Justin Young ZOX:[id number] DOB: [birth year] DOA: 01/06/2017  PCP: [name] Patient coming from: home  Chief Complaint: unresponsiveness  HPI: Justin Young is a 56 y.o. male with medical history significant for HTN presents to the emergency Department chief complaint episode unresponsiveness concerning for syncope vs seizure and chest pain. [facility] are asked to admit.  Information is obtained from the patient and the chart. He states he was in his usual state of health last evening he was sitting on the couch and all the sudden "I was overwhelmed with a feeling of illness". He describes diaphoresis shortness of breath and chest pressure located "all over". He states he was unable to call out for his wife who was upstairs but he was able to call her on his cell phone and asked her to come assist him. Wife reports he was unable to stand up and felt as though the "walls were closing in on me". Patient states he felt like he was going to "pass out" and he feels like he did lose consciousness briefly. Wife reports she did not see patient collapse or lose consciousness. He does next memory he has EMS arriving. He also experienced nausea without vomiting. He denies headache dizziness abdominal pain. He denies disturbances numbness tingling of his extremities or slurred speech when he did speak. He denies any cough fever chills but does indicate he was on an airplane all this week going to [place] in [place] then back to [place].He denies leg pain/swelling.    ED Course: In the emergency department he's afebrile hemodynamically stable and not hypoxic. Not orthostatic. He is provided with a liter of lactated Ringer's  Review of Systems: As per HPI otherwise all other systems reviewed and are negative.   Ambulatory Status: Ambulates independently and is independent with ADLs. Lives at home with his wife  Past Medical History:    Diagnosis Date  . Arthritis   . GERD (gastroesophageal reflux disease)    mild -rolaids if necessary  . Hypertension   . Insomnia   . Spinal stenosis at L4-L5 level     Past Surgical History:  Procedure Laterality Date  . ANKLE RECONSTRUCTION  left   3 procedures total  . CLAVICLE SURGERY     bilateral  . KNEE ARTHROSCOPY  2006-most current   left knee X 2 1 ON RIGHT  . KNEE ARTHROSCOPY WITH MEDIAL MENISECTOMY  01/27/2012   Procedure: KNEE ARTHROSCOPY WITH MEDIAL MENISECTOMY;  Surgeon: Jacki Cones, MD;  Location: [facility];  Service: Orthopedics;  Laterality: Left;  . LUMBAR LAMINECTOMY/DECOMPRESSION MICRODISCECTOMY N/A 03/16/2016   Procedure: CENTRAL DECOMPRESSIVE LUMBAR LAMINECTOMY LUMBAR FOUR TO LUMBAR FIVE;  Surgeon: Ranee Gosselin, MD;  Location: [facility];  Service: Orthopedics;  Laterality: N/A;  . TONSILLECTOMY  AS CHILD    Social History   Socioeconomic History  . Marital status: Married    Spouse name: Justin Young  . Number of children: 3  . Years of education: 40  . Highest education level: Not on file  Social Needs  . Financial resource strain: Not on file  . Food insecurity - worry: Not on file  . Food insecurity - inability: Not on file  . Transportation needs - medical: Not on file  . Transportation needs - non-medical: Not on file  Occupational History  . 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  Smoking status: Former Smoker    Last attempt to quit: 01/25/2004    Years since quitting: 12.9  . Smokeless tobacco: Never Used  Substance and Sexual Activity  . Alcohol use: Yes    Alcohol/week: 4.2 oz    Types: 7 Shots of liquor per week    Comment: occ  . Drug use: No  . Sexual activity: Yes    Partners: Female  Other Topics Concern  . Not on file  Social History Narrative   [employer] - [employer] -misc business classes.Married '84 wife Justin PoundDeborah:    3  kids - g b g: '86, '88, '95. 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   Regular exercise: active      Colonoscopy 7 yrs ago    Allergies  Allergen Reactions  . Sulfa Antibiotics Other (See Comments)    From when younger.  . Codeine Itching    Family History  Problem Relation Age of Onset  . Arthritis Mother   . Cancer Mother        Ovarian cancer primary,colon cancer.  . Diabetes Mother   . Colon cancer Mother   . Alcohol abuse Father   . Hyperlipidemia Father   . Hypertension Father   . Heart disease Father   . Heart disease Sister   . Hypertension Sister   . Sleep apnea Sister   . Arthritis Maternal Grandmother     Prior to Admission medications   Medication Sig Start Date End Date Taking? Authorizing Provider  amLODipine (NORVASC) 5 MG tablet Take 5 mg by mouth daily. 02/18/16  Yes [provider]  ibuprofen (ADVIL,MOTRIN) 200 MG tablet Take 800 mg by mouth 2 (two) times daily as needed (for pain.).   Yes [provider]  loratadine (CLARITIN) 10 MG tablet Take 10 mg by mouth daily.    Yes [provider]  methocarbamol (ROBAXIN) 500 MG tablet Take 1 tablet (500 mg total) by mouth every 6 (six) hours as needed for muscle spasms. 03/16/16  Yes Ranee GosselinGioffre, Ronald, MD  metoprolol (LOPRESSOR) 50 MG tablet Take 50 mg by mouth 2 (two) times daily.  11/16/15  Yes [provider]  omeprazole (PRILOSEC) 20 MG capsule Take 20 mg by mouth daily as needed (acid reflux).   Yes [provider]    Physical Exam: Vitals:   01/06/17 0430 01/06/17 0445 01/06/17 0515 01/06/17 0600  BP: 134/79 135/80 137/85 130/82  Pulse: 62 65 62 61  Resp: 17 17 15  (!) 23  Temp:      TempSrc:      SpO2: 93% 93% 95% 96%     General:  Appears slightly anxious lying in bed in no acute distress Eyes:  PERRL, EOMI, normal lids, iris ENT:  grossly normal hearing, lips & tongue, mucous membranes of his mouth slightly dry but pink Neck:   no LAD, masses or thyromegaly Cardiovascular:  RRR, no m/r/g. No LE edema. Pedal pulses present and palpable Respiratory:  CTA bilaterally, no w/r/r. Normal respiratory effort. Abdomen:  soft, ntnd, positive bowel sounds throughout no guarding or rebounding Skin:  no rash or induration seen on limited exam Musculoskeletal:  grossly normal tone BUE/BLE, good ROM, no bony abnormality Psychiatric:  grossly normal mood and affect, speech fluent and appropriate, AOx3 Neurologic:  CN 2-12 grossly intact, moves all extremities in coordinated fashion, sensation intact. Speech clear facial symmetry bilateral grip 5 out of 5 lower extremity strength 5 out of  5 bilaterally tongue midline no pronator drift  Labs on Admission: I have personally reviewed following labs and imaging studies  CBC: Recent Labs  Lab 01/06/17 0123  WBC 6.6  NEUTROABS 3.4  HGB 13.5  HCT 40.1  MCV 92.2  PLT 186   Basic Metabolic Panel: Recent Labs  Lab 01/06/17 0123  NA 137  K 4.0  CL 107  CO2 21*  GLUCOSE 121*  BUN 17  CREATININE 0.78  CALCIUM 9.0   GFR: CrCl cannot be calculated (Unknown ideal weight.). Liver Function Tests: No results for input(s): AST, ALT, ALKPHOS, BILITOT, PROT, ALBUMIN in the last 168 hours. No results for input(s): LIPASE, AMYLASE in the last 168 hours. No results for input(s): AMMONIA in the last 168 hours. Coagulation Profile: Recent Labs  Lab 01/06/17 0123  INR 0.96   Cardiac Enzymes: Recent Labs  Lab 01/06/17 0123  TROPONINI <0.03   BNP (last 3 results) No results for input(s): PROBNP in the last 8760 hours. HbA1C: No results for input(s): HGBA1C in the last 72 hours. CBG: Recent Labs  Lab 01/06/17 0154  GLUCAP 115*   Lipid Profile: No results for input(s): CHOL, HDL, LDLCALC, TRIG, CHOLHDL, LDLDIRECT in the last 72 hours. Thyroid Function Tests: No results for input(s): TSH, T4TOTAL, FREET4, T3FREE, THYROIDAB in the last 72 hours. Anemia Panel: No results  for input(s): VITAMINB12, FOLATE, FERRITIN, TIBC, IRON, RETICCTPCT in the last 72 hours. Urine analysis:    Component Value Date/Time   COLORURINE YELLOW 03/09/2016 0930   APPEARANCEUR CLEAR 03/09/2016 0930   LABSPEC 1.027 03/09/2016 0930   PHURINE 5.0 03/09/2016 0930   GLUCOSEU NEGATIVE 03/09/2016 0930   HGBUR NEGATIVE 03/09/2016 0930   BILIRUBINUR NEGATIVE 03/09/2016 0930   KETONESUR NEGATIVE 03/09/2016 0930   PROTEINUR NEGATIVE 03/09/2016 0930   UROBILINOGEN 0.2 04/24/2011 1757   NITRITE NEGATIVE 03/09/2016 0930   LEUKOCYTESUR NEGATIVE 03/09/2016 0930    Creatinine Clearance: CrCl cannot be calculated (Unknown ideal weight.).  Sepsis Labs: @LABRCNTIP (procalcitonin:4,lacticidven:4) )No results found for this or any previous visit (from the past 240 hour(s)).   Radiological Exams on Admission: Ct Angio Chest Pe W And/or Wo Contrast  Result Date: 01/06/2017 CLINICAL DATA:  Positive D-dimer. Shortness of breath and diaphoresis. EXAM: CT ANGIOGRAPHY CHEST WITH CONTRAST TECHNIQUE: Multidetector CT imaging of the chest was performed using the standard protocol during bolus administration of intravenous contrast. Multiplanar CT image reconstructions and MIPs were obtained to evaluate the vascular anatomy. CONTRAST:  100mL ISOVUE-370 IOPAMIDOL (ISOVUE-370) INJECTION 76% COMPARISON:  Chest radiograph 03/09/2016 FINDINGS: Cardiovascular: Contrast injection is sufficient to demonstrate satisfactory opacification of the pulmonary arteries to the segmental level. There is no pulmonary embolus. The main pulmonary artery is within normal limits for size. There is no CT evidence of acute right heart strain. Mild calcific aortic atherosclerosis. There is a normal 3-vessel arch branching pattern. Heart size is normal, without pericardial effusion. Mediastinum/Nodes: No mediastinal, hilar or axillary lymphadenopathy. The visualized thyroid and thoracic esophageal course are unremarkable. Lungs/Pleura:  Multifocal air-trapping and ground-glass attenuation. Mild bibasilar atelectasis. No focal consolidation. No pleural effusion or pneumothorax. No pulmonary nodules or masses. Upper Abdomen: Contrast bolus timing is not optimized for evaluation of the abdominal organs. Within this limitation, the visualized organs of the upper abdomen are normal. Musculoskeletal: No chest wall abnormality. No acute or significant osseous findings. Review of the MIP images confirms the above findings. IMPRESSION: 1. No pulmonary embolus or other acute thoracic abnormality. 2. Areas of ground-glass attenuation within both lungs with  possible air trapping. 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 Mild pulmonary edema might have a similar appearance, though there is no septal thickening. Electronically Signed   By: Deatra Robinson M.D.   On: 01/06/2017 03:58    EKG: Independently reviewed. Sinus rhythm RSR' in V1 or V2, right VCD or RVH - is new No acute changes s1q3t3 in old ekg  Assessment/Plan Principal Problem:   Episode of unresponsiveness Active Problems:   Essential hypertension   Chest pain   Syncope   #1. Syncope/episode of unresponsiveness. Etiology unclear. Syncope vs seizure vs panic attack? Patient loss consciousness. Wife reports patient gagging/shaking while sitting on the couch. No hx seizure.  Neuro exam benign. Initial troponin neg. ekg without signs of ischemia. CT chest negative for PE. No metabolic derangement. Not orthostatic -Admit to telemetry -CT of the head -Urinalysis -Urine drug screen -2-D echo -EEG -gentle IV fluids  #2. Chest pain. Patient describes generalized pressure in his chest during episode. Former smoker. Family history heart disease. Initial troponin negative. EKG without acute changes. Pain-free on admission. He did have an elevated d-dimer. CT of the chest negative for PE -Cycle troponin -Serial EKG -Supportive therapy  #3. Hypertension. Controlled in the  emergency department Home medications include Norvasc and metoprolol. Patient not orthostatic on admission -Continue home meds -monitor    DVT prophylaxis: lovenox  Code Status: full  Family Communication: none present  Disposition Plan: home hopefully in am  Consults called: none  Admission status: obs    Gwenyth Bender MD [facility]  If 7PM-7AM, please contact night-coverage [email or web address] Password [email or web address]  01/06/2017, 7:00 AM

## 2017-01-06 NOTE — Progress Notes (Signed)
Patient arrived to unit, awake ,alert and oriented x4. Bed alarm activated. patient oriented to room and informed to call help before getting out of bed. Patient also placed on Seizure precautions .

## 2017-01-06 NOTE — ED Notes (Signed)
ED Provider at bedside. 

## 2017-01-06 NOTE — Plan of Care (Signed)
Discussed patient with Dr. Rhunette CroftNanavati Justin Young is a 56 year old male with pmh of HTN, spinal stenosis, GERD, obesity, and insomnia; who presents with episode of responsiveness which the details are not exactly clear.  Patient reported feeling as though he may pass out, became diaphoretic, and had reported gagging /shaking while seated.  Wife reports never seeing patient collapse or lose consciousness.  Labs revealed elevated d-dimer of 0.62 and troponin negative.  CT angiogram of the chest showed no acute signs of a pulmonary embolus.  Question syncope vs seizure admit as observation to a telemetry bed.  May possibly benefit from a neurology consultation.

## 2017-01-06 NOTE — Procedures (Signed)
ELECTROENCEPHALOGRAM REPORT  Date of Study: 01/06/2017  Patient's Name: Justin Young MRN: [id number] Date of Birth: 1960-06-11  Referring Provider: Toya SmothersKaren Black, NP  Clinical History: 56 y.o. male with medical history significant for HTN presents to the emergency Department chief complaint episode unresponsiveness concerning for syncope vs seizure and chest pain.  Medications: Methocarbamol Trazodone Ondansetron Metoprolol Acetaminophen Amlodipine Bisacodyl Enoxaparin Ibuprofen Loratadine Pantoprazole    Technical Summary: A multichannel digital EEG recording measured by the international 10-20 system with electrodes applied with paste and impedances below 5000 ohms performed in our laboratory with EKG monitoring in an awake and asleep patient.  Hyperventilation and photic stimulation were not performed.  The digital EEG was referentially recorded, reformatted, and digitally filtered in a variety of bipolar and referential montages for optimal display.    Description: The patient is awake and asleep during the recording.  During maximal wakefulness, there is a symmetric, medium voltage 10 Hz posterior dominant rhythm that attenuates with eye opening.  The record is symmetric.  During drowsiness and sleep, there is an increase in theta slowing of the background.  Stage 2 sleep is seen.  There were no epileptiform discharges or electrographic seizures seen.    EKG lead was unremarkable.  Impression: This awake and asleep EEG is normal.    Clinical Correlation: A normal EEG does not exclude a clinical diagnosis of epilepsy.  If further clinical questions remain, prolonged EEG may be helpful.  Clinical correlation is advised.   Shon MilletAdam Jaffe, DO

## 2017-01-06 NOTE — Progress Notes (Signed)
EEG completed, results pending. 

## 2017-01-06 NOTE — Progress Notes (Signed)
Patient discharge teaching complete. Meds, diet, activity, follow up appointments reviewed and all questions answered. Copy of instructions given to patient. Patient discharged home via wheelchair with wife.

## 2017-01-06 NOTE — ED Triage Notes (Addendum)
Per [facility], Pt from home. Pt was sitting on couch, when he started feeling diaphoretic, like he might have a BM. Pt had a syncopal episode on couch, did not hit his head or fall. Pt drove over 2,000 miles from Sunday to Thursday. Pt reports no recent illness. Pt's wife reports seizure-like activity. Pt alert and oriented x4. Pt had 3 ETOH drinks tonight at holiday party at work.

## 2017-01-06 NOTE — ED Notes (Signed)
Pt is able to stand at bedside for vitals with no dizziness. Pt says he feels fine while standing.

## 2017-02-10 ENCOUNTER — Encounter: Payer: Self-pay | Admitting: Pulmonary Disease

## 2017-02-10 ENCOUNTER — Ambulatory Visit (INDEPENDENT_AMBULATORY_CARE_PROVIDER_SITE_OTHER): Payer: BLUE CROSS/BLUE SHIELD | Admitting: Pulmonary Disease

## 2017-02-10 ENCOUNTER — Other Ambulatory Visit (INDEPENDENT_AMBULATORY_CARE_PROVIDER_SITE_OTHER): Payer: BLUE CROSS/BLUE SHIELD

## 2017-02-10 VITALS — BP 130/78 | HR 60 | Ht 70.0 in | Wt 227.0 lb

## 2017-02-10 DIAGNOSIS — R0602 Shortness of breath: Secondary | ICD-10-CM

## 2017-02-10 DIAGNOSIS — G4719 Other hypersomnia: Secondary | ICD-10-CM

## 2017-02-10 LAB — CBC WITH DIFFERENTIAL/PLATELET
Basophils Absolute: 0.1 10*3/uL (ref 0.0–0.1)
Basophils Relative: 0.7 % (ref 0.0–3.0)
EOS PCT: 2.6 % (ref 0.0–5.0)
Eosinophils Absolute: 0.2 10*3/uL (ref 0.0–0.7)
HCT: 45.6 % (ref 39.0–52.0)
HEMOGLOBIN: 15.1 g/dL (ref 13.0–17.0)
Lymphocytes Relative: 21.6 % (ref 12.0–46.0)
Lymphs Abs: 1.9 10*3/uL (ref 0.7–4.0)
MCHC: 33.1 g/dL (ref 30.0–36.0)
MCV: 91.3 fl (ref 78.0–100.0)
MONOS PCT: 8.4 % (ref 3.0–12.0)
Monocytes Absolute: 0.7 10*3/uL (ref 0.1–1.0)
Neutro Abs: 5.7 10*3/uL (ref 1.4–7.7)
Neutrophils Relative %: 66.7 % (ref 43.0–77.0)
Platelets: 209 10*3/uL (ref 150.0–400.0)
RBC: 4.99 Mil/uL (ref 4.22–5.81)
RDW: 13.9 % (ref 11.5–15.5)
WBC: 8.6 10*3/uL (ref 4.0–10.5)

## 2017-02-10 LAB — NITRIC OXIDE: NITRIC OXIDE: 9

## 2017-02-10 MED ORDER — TIOTROPIUM BROMIDE-OLODATEROL 2.5-2.5 MCG/ACT IN AERS: 2.0000 | INHALATION_SPRAY | Freq: Every day | RESPIRATORY_TRACT | 6 refills | Status: DC

## 2017-02-10 MED ORDER — TIOTROPIUM BROMIDE-OLODATEROL 2.5-2.5 MCG/ACT IN AERS: 2.0000 | INHALATION_SPRAY | Freq: Every day | RESPIRATORY_TRACT | 0 refills | Status: AC

## 2017-02-10 NOTE — Patient Instructions (Signed)
We will get pulmonary function test to evaluate emphysema and blood test to look for allergy and asthma inflammation We will start you on an inhaler to help open up your lungs Schedule you for sleep study Please follow-up with Dr. Jacinto HalimGanji and have him send his evaluation from our record Follow-up in 2 months.

## 2017-02-10 NOTE — Progress Notes (Signed)
Justin Young    [id number]    1960/10/12  Primary Care Physician:[place]  Referring Physician: Charlies Silvers, PA-C [address] [place], [place] [address]  Chief complaint: Consult for abnormal CT scan  HPI: 57 year old with past medical history of hypertension, osteoarthritis, syncope.  He was hospitalized in December 2019 for chest pressure, dyspnea, diaphoresis, syncope.  CT angiogram did not show any pulmonary embolism,  there was mild groundglass opacities with air trapping and he has been referred here for further evaluation. CT head was negative, UDS positive for marijuana. He also underwent evaluation for angina with troponins and EKG which were negative.  He is scheduled to see a neurologist and Dr. Jacinto Halim, cardiologist as an outpatient.  Since his discharge he does not report any new symptoms.  Denies any dyspnea, cough, sputum production, wheezing. Has symptoms of snoring, daytime sleepiness and witnessed apneas. Eppworth score 02/10/17-14  Pets: Dogs and cats.  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  No current exposures.  No mold, hot tub Smoking history: 90-pack-year smoking history.  Quit in 2008.  Continues to smoke cigars and marijuana occasionally Travel History: [employer] all over the [place] in his line of work.  Lived in [place] all his life.  Outpatient Encounter Medications as of 02/10/2017  Medication Sig  . amLODipine (NORVASC) 5 MG tablet Take 5 mg by mouth daily.  Marland Kitchen aspirin 81 MG chewable tablet Chew by mouth daily.  Marland Kitchen ibuprofen (ADVIL,MOTRIN) 200 MG tablet Take 800 mg by mouth 2 (two) times daily as needed (for pain.).  Marland Kitchen loratadine (CLARITIN) 10 MG tablet Take 10 mg by mouth daily.   . methocarbamol (ROBAXIN) 500 MG tablet Take 1 tablet (500 mg  total) by mouth every 6 (six) hours as needed for muscle spasms.  . metoprolol (LOPRESSOR) 50 MG tablet Take 50 mg by mouth 2 (two) times daily.   Marland Kitchen omeprazole (PRILOSEC) 20 MG capsule Take 20 mg by mouth daily as needed (acid reflux).   No facility-administered encounter medications on file as of 02/10/2017.     Allergies as of 02/10/2017 - Review Complete 02/10/2017  Allergen Reaction Noted  . Sulfa antibiotics Other (See Comments) 04/24/2011  . Codeine Itching 04/24/2011    Past Medical History:  Diagnosis Date  . Arthritis   . Chest pain   . GERD (gastroesophageal reflux disease)    mild -rolaids if necessary  . Hypertension   . Insomnia   . 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 Syncope     Past Surgical History:  Procedure Laterality Date  . ANKLE RECONSTRUCTION  left   3 procedures total  . CLAVICLE SURGERY     bilateral  . KNEE ARTHROSCOPY  2006-most current   left knee X 2 1 ON RIGHT  . KNEE ARTHROSCOPY WITH MEDIAL MENISECTOMY  01/27/2012   Procedure: KNEE ARTHROSCOPY WITH MEDIAL MENISECTOMY;  Surgeon: Jacki Cones, MD;  [facility];  Service: Orthopedics;  Laterality: Left;  . LUMBAR LAMINECTOMY/DECOMPRESSION MICRODISCECTOMY N/A 03/16/2016   Procedure: CENTRAL DECOMPRESSIVE LUMBAR LAMINECTOMY LUMBAR FOUR TO LUMBAR FIVE;  Surgeon: Ranee Gosselin, MD;  Location: [facility];  Service: Orthopedics;  Laterality: N/A;  . TONSILLECTOMY  AS CHILD    Family History  Problem Relation Age of Onset  .  Arthritis Mother   . Cancer Mother        Ovarian cancer primary,colon cancer.  . Diabetes Mother   . Colon cancer Mother   . Alcohol abuse Father   . Hyperlipidemia Father   . Hypertension Father   . Heart disease Father   . Heart disease Sister   . Hypertension Sister   . Sleep apnea Sister   . Arthritis Maternal Grandmother     Social History   Socioeconomic History  . Marital status: Married    Spouse name: Gavin Pound  . Number of children:  3  . Years of education: 51  . Highest education level: Not on file  Social Needs  . Financial resource strain: Not on file  . Food insecurity - worry: Not on file  . Food insecurity - inability: Not on file  . Transportation needs - medical: Not on file  . Transportation needs - non-medical: Not on file  Occupational History  . 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 Smoking status: Former Smoker    Packs/day: 3.00    Years: 30.00    Pack years: 90.00    Last attempt to quit: 01/25/2007    Years since quitting: 10.0  . Smokeless tobacco: Never Used  Substance and Sexual Activity  . Alcohol use: Yes    Alcohol/week: 4.2 oz    Types: 7 Shots of liquor per week    Comment: occ  . Drug use: No  . Sexual activity: Yes    Partners: Female  Other Topics Concern  . Not on file  Social History Narrative   HSG, [employer] - BS [place], [employer] -misc business classes.Married '84 wife Gavin Pound:    3 kids - g b g: '86, '88, '95. 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   Regular exercise: active      Colonoscopy 7 yrs ago   Review of systems: Review of Systems  Constitutional: Negative for fever and chills.  HENT: Negative.   Eyes: Negative for blurred vision.  Respiratory: as per HPI  Cardiovascular: Negative for chest pain and palpitations.  Gastrointestinal: Negative for vomiting, diarrhea, blood per rectum. Genitourinary: Negative for dysuria, urgency, frequency and hematuria.  Musculoskeletal: Negative for myalgias, back pain and joint pain.  Skin: Negative for itching and rash.  Neurological: Negative for dizziness, tremors, focal weakness, seizures and loss of consciousness.  Endo/Heme/Allergies: Negative for environmental allergies.  Psychiatric/Behavioral: Negative for depression, suicidal ideas and hallucinations.  All other systems reviewed and are negative.  Physical  Exam: Blood pressure 130/78, pulse 60, height 5\' 10"  (1.778 m), weight 227 lb (103 kg), SpO2 97 %. Gen:      No acute distress HEENT:  EOMI, sclera anicteric Neck:     No masses; no thyromegaly Lungs:    Clear to auscultation bilaterally; normal respiratory effort CV:         Regular rate and rhythm; no murmurs Abd:      + bowel sounds; soft, non-tender; no palpable masses, no distension Ext:    No edema; adequate peripheral perfusion Skin:      Warm and dry; no rash Neuro: alert and oriented x 3 Psych: normal mood and affect  Data Reviewed: CT angio chest 01/06/17 No pulmonary embolism, air-trapping, mild groundglass attenuation, basilar atelectasis.  I have reviewed the images personally  Echocardiogram 01/06/17 Mild hypertrophy of the septum, systolic function is normal.  EF 55-60%.  Grade 1 diastolic dysfunction.  Pulmonary systolic pressure is mildly increased.  FENO 02/10/17-9  Assessment:  Consult for abnormal CT scan CT images showed mosaic attenuation consistent with air trapping.  This could be secondary to emphysema given his smoking history.  Suspicion for asthma is low.  Check blood work today including CBC with differential, blood allergy profile, alpha-1 antitrypsin levels and phenotype Get pulmonary function test for better evaluation of the lung function and start stiolto inhaler.  He likely has diastolic dysfunction, mild pulmonary hypertension.  Pulmonary edema can give similar findings on CT scan.  He has follow-up with Dr. Jacinto HalimGanji for further evaluation.  Suspected sleep apnea Schedule split-night sleep study  Plan/Recommendations: - CBC differential, blood allergy profile, alpha-1 antitrypsin levels - Pulmonary function test, start stiolto inhaler - Sleep study.  Chilton GreathousePraveen Lesia Monica MD [facility] Pager [phone] 02/10/2017, 11:19 AM  CC: Charlies Silversouillard, Jennifer, GeorgiaPA*

## 2017-02-13 ENCOUNTER — Other Ambulatory Visit: Payer: Self-pay

## 2017-02-13 DIAGNOSIS — G4719 Other hypersomnia: Secondary | ICD-10-CM

## 2017-02-13 NOTE — Progress Notes (Signed)
hsty

## 2017-02-15 LAB — RESPIRATORY ALLERGY PANEL REGION II W/ RFLX: ~~LOC~~
Allergen, A. alternata, m6: <0.1 kU/L
Allergen, Cedar tree, t12: <0.1 kU/L
Allergen, Comm Silver Birch, t9: <0.1 kU/L
Allergen, Cottonwood, t14: <0.1 kU/L
Allergen, D pternoyssinus,d7: <0.1 kU/L
Allergen, Mouse Urine Protein, e78: <0.1 kU/L
Allergen, Mulberry, t76: <0.1 kU/L
Allergen, Oak,t7: <0.1 kU/L
Allergen, P. notatum, m1: <0.1 kU/L
Aspergillus fumigatus, m3: <0.1 kU/L
Bermuda Grass: <0.1 kU/L
Box Elder IgE: 0.13 kU/L — ABNORMAL HIGH
CLADOSPORIUM HERBARUM (M2) IGE: <0.1 kU/L
COMMON RAGWEED (SHORT) (W1) IGE: <0.1 kU/L
Cat Dander: <0.1 kU/L
Class: 0
Class: 0
Class: 0
Class: 0
Class: 0
Class: 0
Class: 0
Class: 0
Class: 0
Class: 0
Class: 0
Class: 0
Class: 0
Class: 0
Class: 0
Class: 0
Class: 0
Class: 0
Class: 0
Class: 0
Class: 0
Class: 0
Class: 0
Class: 0
Cockroach: <0.1 kU/L
D. farinae: <0.1 kU/L
Dog Dander: <0.1 kU/L
Elm IgE: <0.1 kU/L
IgE (Immunoglobulin E), Serum: 9 kU/L
Johnson Grass: <0.1 kU/L
Pecan/Hickory Tree IgE: <0.1 kU/L
Rough Pigweed  IgE: <0.1 kU/L
Sheep Sorrel IgE: <0.1 kU/L
Timothy Grass: <0.1 kU/L

## 2017-02-15 LAB — ALPHA-1 ANTITRYPSIN PHENOTYPE: A-1 Antitrypsin, Ser: 142 mg/dL (ref 83–199)

## 2017-02-15 LAB — INTERPRETATION:

## 2017-02-17 ENCOUNTER — Encounter (HOSPITAL_BASED_OUTPATIENT_CLINIC_OR_DEPARTMENT_OTHER): Payer: BLUE CROSS/BLUE SHIELD

## 2017-02-19 ENCOUNTER — Encounter (HOSPITAL_BASED_OUTPATIENT_CLINIC_OR_DEPARTMENT_OTHER): Payer: BLUE CROSS/BLUE SHIELD

## 2017-02-27 ENCOUNTER — Ambulatory Visit: Payer: BLUE CROSS/BLUE SHIELD | Admitting: Internal Medicine

## 2017-03-07 ENCOUNTER — Ambulatory Visit (INDEPENDENT_AMBULATORY_CARE_PROVIDER_SITE_OTHER): Payer: Self-pay | Admitting: Pulmonary Disease

## 2017-03-07 DIAGNOSIS — G4733 Obstructive sleep apnea (adult) (pediatric): Secondary | ICD-10-CM

## 2017-03-07 DIAGNOSIS — R0602 Shortness of breath: Secondary | ICD-10-CM

## 2017-03-07 LAB — PULMONARY FUNCTION TEST
DL/VA % pred: 91 %
DL/VA: 4.26 ml/min/mmHg/L
DLCO UNC % PRED: 90 %
DLCO UNC: 29.36 ml/min/mmHg
DLCO cor % pred: 89 %
DLCO cor: 28.96 ml/min/mmHg
FEF 25-75 PRE: 2.45 L/s
FEF 25-75 Post: 3.11 L/sec
FEF2575-%Change-Post: 26 %
FEF2575-%PRED-PRE: 76 %
FEF2575-%Pred-Post: 97 %
FEV1-%Change-Post: 3 %
FEV1-%PRED-POST: 93 %
FEV1-%PRED-PRE: 90 %
FEV1-POST: 3.52 L
FEV1-PRE: 3.4 L
FEV1FVC-%Change-Post: 2 %
FEV1FVC-%Pred-Pre: 95 %
FEV6-%CHANGE-POST: 0 %
FEV6-%PRED-POST: 97 %
FEV6-%PRED-PRE: 97 %
FEV6-PRE: 4.57 L
FEV6-Post: 4.59 L
FEV6FVC-%CHANGE-POST: 0 %
FEV6FVC-%PRED-PRE: 103 %
FEV6FVC-%Pred-Post: 104 %
FVC-%Change-Post: 0 %
FVC-%Pred-Post: 94 %
FVC-%Pred-Pre: 93 %
FVC-Post: 4.63 L
FVC-Pre: 4.6 L
POST FEV1/FVC RATIO: 76 %
POST FEV6/FVC RATIO: 100 %
PRE FEV1/FVC RATIO: 74 %
Pre FEV6/FVC Ratio: 99 %
RV % PRED: 115 %
RV: 2.49 L
TLC % PRED: 104 %
TLC: 7.26 L

## 2017-03-07 NOTE — Progress Notes (Signed)
PFT completed today.  

## 2017-03-08 DIAGNOSIS — G4733 Obstructive sleep apnea (adult) (pediatric): Secondary | ICD-10-CM

## 2017-03-09 ENCOUNTER — Other Ambulatory Visit: Payer: Self-pay | Admitting: *Deleted

## 2017-03-09 DIAGNOSIS — G4719 Other hypersomnia: Secondary | ICD-10-CM

## 2017-03-13 ENCOUNTER — Ambulatory Visit (INDEPENDENT_AMBULATORY_CARE_PROVIDER_SITE_OTHER): Payer: Self-pay | Admitting: Pulmonary Disease

## 2017-03-13 ENCOUNTER — Encounter: Payer: Self-pay | Admitting: Pulmonary Disease

## 2017-03-13 VITALS — BP 144/80 | HR 60 | Ht 70.0 in | Wt 232.2 lb

## 2017-03-13 DIAGNOSIS — G4733 Obstructive sleep apnea (adult) (pediatric): Secondary | ICD-10-CM

## 2017-03-13 NOTE — Progress Notes (Addendum)
Trina Aoatrick J Sheppard    [id number]    10/21/1960  Primary Care Physician:[place]  Referring Physician: [place] Rocker[place], [facility] [address] [place], [place] [id number]  Chief complaint: Follow-up for abnormal CT scan, sleep apnea  HPI: 57 year old with past medical history of hypertension, osteoarthritis, syncope.  He was hospitalized in December 2019 for chest pressure, dyspnea, diaphoresis, syncope.  CT angiogram did not show any pulmonary embolism,  there was mild groundglass opacities with air trapping and he has been referred here for further evaluation. CT head was negative, UDS positive for marijuana. He also underwent evaluation for angina with troponins and EKG which were negative.  He is scheduled to see a neurologist and Dr. Jacinto HalimGanji, cardiologist as an outpatient.  Since his discharge he does not report any new symptoms.  Denies any dyspnea, cough, sputum production, wheezing. Has symptoms of snoring, daytime sleepiness and witnessed apneas. Eppworth score 02/10/17-14  Pets: Dogs and cats.  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  Used to work in [employer] when he was younger Exposures: He may have been exposed to [employer] representativeconstruction and asbestos when he was younger.  No current exposures.  No mold, hot tub Smoking history: 90-pack-year smoking history.  Quit in 2008.  Continues to smoke cigars and marijuana occasionally Travel History: [employer] [place] in his line of work.  [place].  Interim history: He had PFTs and sleep study and is here for review of test.  Reports that he does not have any dyspnea and is asymptomatic from a lung standpoint His insurance has been canceled due to issues with fee increase and he is working on getting this restored.  Outpatient Encounter Medications as of 03/13/2017  Medication Sig  . amLODipine (NORVASC) 5 MG tablet Take 5  mg by mouth daily.  Marland Kitchen. aspirin 81 MG chewable tablet Chew by mouth daily.  Marland Kitchen. ibuprofen (ADVIL,MOTRIN) 200 MG tablet Take 800 mg by mouth 2 (two) times daily as needed (for pain.).  Marland Kitchen. loratadine (CLARITIN) 10 MG tablet Take 10 mg by mouth daily.   . metoprolol (LOPRESSOR) 50 MG tablet Take 50 mg by mouth 2 (two) times daily.   Marland Kitchen. omeprazole (PRILOSEC) 20 MG capsule Take 20 mg by mouth daily as needed (acid reflux).  . [DISCONTINUED] methocarbamol (ROBAXIN) 500 MG tablet Take 1 tablet (500 mg total) by mouth every 6 (six) hours as needed for muscle spasms.  . [DISCONTINUED] Tiotropium Bromide-Olodaterol (STIOLTO RESPIMAT) 2.5-2.5 MCG/ACT AERS Inhale 2 puffs into the lungs daily.   No facility-administered encounter medications on file as of 03/13/2017.     Allergies as of 03/13/2017 - Review Complete 03/13/2017  Allergen Reaction Noted  . Sulfa antibiotics Other (See Comments) 04/24/2011  . Codeine Itching 04/24/2011    Past Medical History:  Diagnosis Date  . Arthritis   . Chest pain   . GERD (gastroesophageal reflux disease)    mild -rolaids if necessary  . Hypertension   . Insomnia   . 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 Syncope     Past Surgical History:  Procedure Laterality Date  . ANKLE RECONSTRUCTION  left   3 procedures total  . CLAVICLE SURGERY     bilateral  . KNEE ARTHROSCOPY  2006-most current   left knee X 2 1 ON RIGHT  . KNEE ARTHROSCOPY WITH MEDIAL MENISECTOMY  01/27/2012   Procedure:  KNEE ARTHROSCOPY WITH MEDIAL MENISECTOMY;  Surgeon: Jacki Cones, MD;  [facility];  Service: Orthopedics;  Laterality: Left;  . LUMBAR LAMINECTOMY/DECOMPRESSION MICRODISCECTOMY N/A 03/16/2016   Procedure: CENTRAL DECOMPRESSIVE LUMBAR LAMINECTOMY LUMBAR FOUR TO LUMBAR FIVE;  Surgeon: Ranee Gosselin, MD;  Location: [facility];  Service: Orthopedics;  Laterality: N/A;  . TONSILLECTOMY  AS CHILD    Family History  Problem Relation Age of Onset  . Arthritis  Mother   . Cancer Mother        Ovarian cancer primary,colon cancer.  . Diabetes Mother   . Colon cancer Mother   . Alcohol abuse Father   . Hyperlipidemia Father   . Hypertension Father   . Heart disease Father   . Heart disease Sister   . Hypertension Sister   . Sleep apnea Sister   . Arthritis Maternal Grandmother     Social History   Socioeconomic History  . Marital status: Married    Spouse name: Gavin Pound  . Number of children: 3  . Years of education: 84  . Highest education level: Not on file  Social Needs  . Financial resource strain: Not on file  . Food insecurity - worry: Not on file  . Food insecurity - inability: Not on file  . Transportation needs - medical: Not on file  . Transportation needs - non-medical: Not on file  Occupational History  . 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 Smoking status: Former Smoker    Packs/day: 3.00    Years: 30.00    Pack years: 90.00    Last attempt to quit: 01/25/2007    Years since quitting: 10.1  . Smokeless tobacco: Never Used  Substance and Sexual Activity  . Alcohol use: Yes    Alcohol/week: 4.2 oz    Types: 7 Shots of liquor per week    Comment: occ  . Drug use: No  . Sexual activity: Yes    Partners: Female  Other Topics Concern  . Not on file  Social History Narrative   HSG, [employer] - BS [place], [employer] -misc business classes.Married '84 wife Gavin Pound:    3 kids - g b g: '86, '88, '95. 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   Regular exercise: active      Colonoscopy 7 yrs ago   Review of systems: Review of Systems  Constitutional: Negative for fever and chills.  HENT: Negative.   Eyes: Negative for blurred vision.  Respiratory: as per HPI  Cardiovascular: Negative for chest pain and palpitations.  Gastrointestinal: Negative for vomiting, diarrhea, blood per rectum. Genitourinary: Negative for  dysuria, urgency, frequency and hematuria.  Musculoskeletal: Negative for myalgias, back pain and joint pain.  Skin: Negative for itching and rash.  Neurological: Negative for dizziness, tremors, focal weakness, seizures and loss of consciousness.  Endo/Heme/Allergies: Negative for environmental allergies.  Psychiatric/Behavioral: Negative for depression, suicidal ideas and hallucinations.  All other systems reviewed and are negative.  Physical Exam: Blood pressure (!) 144/80, pulse 60, height 5\' 10"  (1.778 m), weight 232 lb 3.2 oz (105.3 kg), SpO2 96 %. Gen:      No acute distress HEENT:  EOMI, sclera anicteric Neck:     No masses; no thyromegaly Lungs:    Clear to auscultation bilaterally; normal respiratory effort CV:         Regular rate and rhythm; no murmurs Abd:      +  bowel sounds; soft, non-tender; no palpable masses, no distension Ext:    No edema; adequate peripheral perfusion Skin:      Warm and dry; no rash Neuro: alert and oriented x 3 Psych: normal mood and affect  Data Reviewed: CT angio chest 01/06/17 No pulmonary embolism, air-trapping, mild groundglass attenuation, basilar atelectasis.  I have reviewed the images personally  Echocardiogram 01/06/17 Mild hypertrophy of the septum, systolic function is normal.  EF 55-60%.  Grade 1 diastolic dysfunction.  Pulmonary systolic pressure is mildly increased.  FENO 02/10/17-9  PFTs 03/07/17 FVC 4.63 [94%], FEV1 3.52 [93%], F/F 76, TLC 104%, DLCO 90% Minimal airway obstruction, small airway disease.  CBC 02/10/17-WBC 8.6, eos 2.6%, absolute eosinophil count 200 Blood allergy profile 02/10/17-IgE 9, RAST panel mildly positive for box elder  Home sleep study 03/07/17-severe sleep apnea with AHI 66.  Oxygen desaturations  Assessment:  Consult for abnormal CT scan CT images showed mosaic attenuation consistent with air trapping.  This could be secondary to emphysema given his smoking history.  Suspicion for asthma is low.    PFTs showed no overt obstruction by F/F criteria.  He will not need any inhalers as he is asymptomatic. He likely has diastolic dysfunction, mild pulmonary hypertension.  Pulmonary edema can give similar findings on CT scan.  He has follow-up with Dr. Jacinto Halim for further evaluation.  Severe sleep apnea He will need a CPAP titration study.  He will let us know when his insurance is restored and will place the order for this.  Plan/Recommendations: -CPAP titration study.  Chilton Greathouse MD [facility] Pager [phone] 03/13/2017, 11:56 AM  CC: Roe Coombs*

## 2017-03-13 NOTE — Patient Instructions (Signed)
The lung function test show very minimal changes of early emphysema You do not need any inhalers if you do not have symptoms of shortness of breath Your sleep study shows severe sleep apnea with low oxygen levels I recommend you go to the lab to get a CPAP titration study Please let us know as soon as you have the insurance straightened out and we will order this test Follow-up in 3 months.

## 2017-05-23 IMAGING — DX DG SPINE 1V PORT
1 series · 1 of 1 positions shown · non-contrast
Comparison: Intraoperative localization film earlier today.

CLINICAL DATA: Intraoperative localization film for patient
undergoing lumbar surgery.

EXAM:
PORTABLE SPINE - 1 VIEW

[l-spine lat]
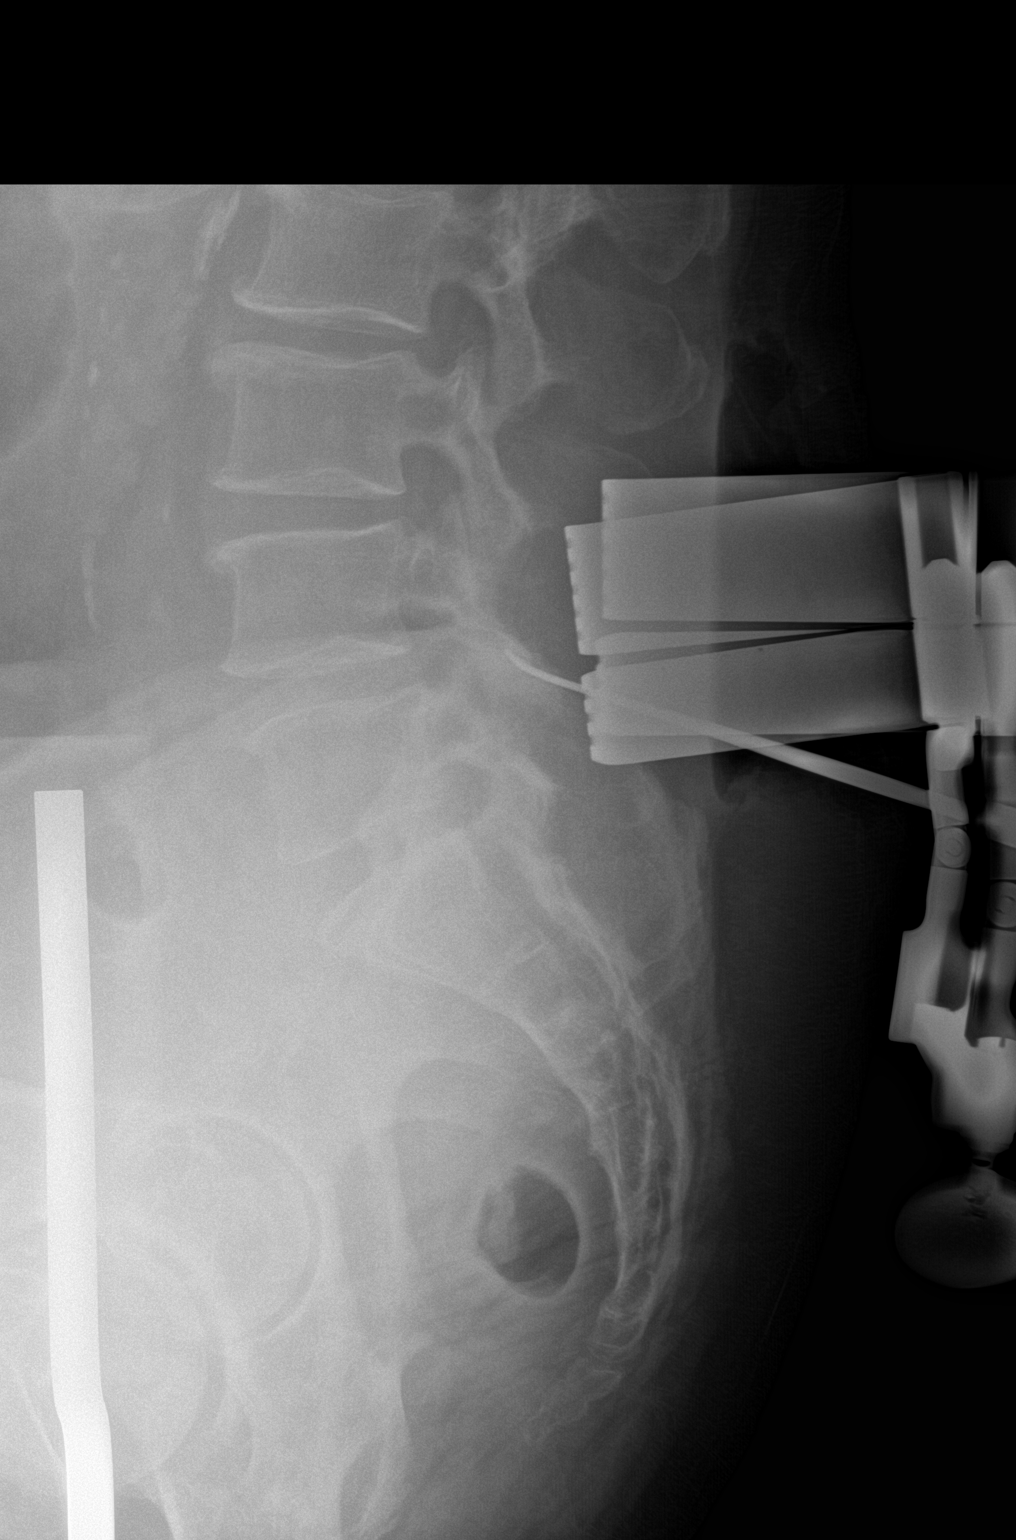

[1 of 1 positions shown; findings below may reference images not displayed]

FINDINGS: Single lateral intraoperative view of the lumbar spine demonstrates
a probe at the level of the L4-5 disc interspace.
IMPRESSION: As above.

## 2017-06-20 ENCOUNTER — Encounter: Payer: Self-pay | Admitting: Pulmonary Disease

## 2017-06-20 ENCOUNTER — Ambulatory Visit (INDEPENDENT_AMBULATORY_CARE_PROVIDER_SITE_OTHER): Payer: Self-pay | Admitting: Pulmonary Disease

## 2017-06-20 VITALS — BP 116/68 | HR 68 | Ht 70.0 in | Wt 224.0 lb

## 2017-06-20 DIAGNOSIS — G4733 Obstructive sleep apnea (adult) (pediatric): Secondary | ICD-10-CM

## 2017-06-20 DIAGNOSIS — G4719 Other hypersomnia: Secondary | ICD-10-CM

## 2017-06-20 NOTE — Patient Instructions (Signed)
I am sorry that you are having issues with insurance Give us a call as soon as the coverage is restored and we will order the CPAP titration study Follow-up in 4 to 6 months.

## 2017-06-20 NOTE — Progress Notes (Signed)
Justin Young    [id number]    1960-12-12  Primary Care Physician:[place], [facility]  Referring Physician: [name] [facility] [address] [place], [place] [address]  Chief complaint: Follow-up for abnormal CT scan, sleep apnea  HPI: 57 year old with past medical history of hypertension, osteoarthritis, syncope.  He was hospitalized in December 2019 for chest pressure, dyspnea, diaphoresis, syncope.  CT angiogram did not show any pulmonary embolism,  there was mild groundglass opacities with air trapping and he has been referred here for further evaluation. CT head was negative, UDS positive for marijuana. He also underwent evaluation for angina with troponins and EKG which were negative.  He is scheduled to see a neurologist and Dr. Jacinto Halim, cardiologist as an outpatient.  Since his discharge he does not report any new symptoms.  Denies any dyspnea, cough, sputum production, wheezing. Has symptoms of snoring, daytime sleepiness and witnessed apneas. Eppworth score 02/10/17-14  Pets: Dogs and cats.  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  No current exposures.  No mold, hot tub Smoking history: 90-pack-year smoking history.  Quit in 2008.  Continues to smoke cigars and marijuana occasionally Travel History: [place] all over the [place] in his line of work.  Lived in [place] all his life.  Interim history: No new complaints today.  Reports that he does not have any dyspnea and is asymptomatic from a lung standpoint Sleep study in February shows severe sleep apnea with desats.  He still waiting for insurance to be reinstated before he can get the CPAP titration study.  Continues to have snoring, apneas and disturbed sleep.  Outpatient Encounter Medications as of  06/20/2017  Medication Sig  . amLODipine (NORVASC) 5 MG tablet Take 5 mg by mouth daily.  Marland Kitchen ibuprofen (ADVIL,MOTRIN) 200 MG tablet Take 800 mg by mouth 2 (two) times daily as needed (for pain.).  Marland Kitchen loratadine (CLARITIN) 10 MG tablet Take 10 mg by mouth daily.   . metoprolol (LOPRESSOR) 50 MG tablet Take 50 mg by mouth 2 (two) times daily.   Marland Kitchen omeprazole (PRILOSEC) 20 MG capsule Take 20 mg by mouth daily as needed (acid reflux).  . [DISCONTINUED] aspirin 81 MG chewable tablet Chew by mouth daily.   No facility-administered encounter medications on file as of 06/20/2017.     Allergies as of 06/20/2017 - Review Complete 06/20/2017  Allergen Reaction Noted  . Sulfa antibiotics Other (See Comments) 04/24/2011  . Codeine Itching 04/24/2011    Past Medical History:  Diagnosis Date  . Arthritis   . Chest pain   . GERD (gastroesophageal reflux disease)    mild -rolaids if necessary  . Hypertension   . Insomnia   . 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 Syncope     Past Surgical History:  Procedure Laterality Date  . ANKLE RECONSTRUCTION  left   3 procedures total  . CLAVICLE SURGERY     bilateral  . KNEE ARTHROSCOPY  2006-most current   left knee X 2 1 ON RIGHT  . KNEE ARTHROSCOPY WITH MEDIAL MENISECTOMY  01/27/2012   Procedure: KNEE ARTHROSCOPY WITH MEDIAL MENISECTOMY;  Surgeon: Jacki Cones, MD;  Location: [facility];  Service: Orthopedics;  Laterality: Left;  . LUMBAR LAMINECTOMY/DECOMPRESSION MICRODISCECTOMY N/A 03/16/2016   Procedure:  CENTRAL DECOMPRESSIVE LUMBAR LAMINECTOMY LUMBAR FOUR TO LUMBAR FIVE;  Surgeon: Ranee Gosselin, MD;  Location: [facility];  Service: Orthopedics;  Laterality: N/A;  . TONSILLECTOMY  AS CHILD    Family History  Problem Relation Age of Onset  . Arthritis Mother   . Cancer Mother        Ovarian cancer primary,colon cancer.  . Diabetes Mother   . Colon cancer Mother   . Alcohol abuse Father   . Hyperlipidemia Father   . Hypertension  Father   . Heart disease Father   . Heart disease Sister   . Hypertension Sister   . Sleep apnea Sister   . Arthritis Maternal Grandmother     Social History   Socioeconomic History  . Marital status: Married    Spouse name: Gavin Pound  . Number of children: 3  . Years of education: 8  . Highest education level: Not on file  Occupational History  . Occupation: Careers adviser: [employer]    Comment: self-employed  Social Needs  . Financial resource strain: Not on file  . Food insecurity:    Worry: Not on file    Inability: Not on file  . Transportation needs:    Medical: Not on file    Non-medical: Not on file  Tobacco Use  . Smoking status: Former Smoker    Packs/day: 3.00    Years: 30.00    Pack years: 90.00    Last attempt to quit: 01/25/2007    Years since quitting: 10.4  . Smokeless tobacco: Never Used  Substance and Sexual Activity  . Alcohol use: Yes    Alcohol/week: 4.2 oz    Types: 7 Shots of liquor per week    Comment: occ  . Drug use: No  . Sexual activity: Yes    Partners: Female  Lifestyle  . Physical activity:    Days per week: Not on file    Minutes per session: Not on file  . Stress: Not on file  Relationships  . Social connections:    Talks on phone: Not on file    Gets together: Not on file    Attends religious service: Not on file    Active member of club or organization: Not on file    Attends meetings of clubs or organizations: Not on file    Relationship status: Not on file  . Intimate partner violence:    Fear of current or ex partner: Not on file    Emotionally abused: Not on file    Physically abused: Not on file    Forced sexual activity: Not on file  Other Topics Concern  . Not on file  Social History Narrative   [employer], [employer] - BS [place], [employer] -misc business classes.Married '84 wife Gavin Pound:    3 kids - g b g: '86, '88, '95. 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    Regular exercise: active      Colonoscopy 7 yrs ago   Review of systems: Review of Systems  Constitutional: Negative for fever and chills.  HENT: Negative.   Eyes: Negative for blurred vision.  Respiratory: as per HPI  Cardiovascular: Negative for chest pain and palpitations.  Gastrointestinal: Negative for vomiting, diarrhea, blood per rectum. Genitourinary: Negative for dysuria, urgency, frequency and hematuria.  Musculoskeletal: Negative for myalgias, back pain and joint pain.  Skin: Negative for itching and rash.  Neurological: Negative for dizziness, tremors, focal weakness, seizures  and loss of consciousness.  Endo/Heme/Allergies: Negative for environmental allergies.  Psychiatric/Behavioral: Negative for depression, suicidal ideas and hallucinations.  All other systems reviewed and are negative.  Physical Exam: Blood pressure 116/68, pulse 68, height 5\' 10"  (1.778 m), weight 224 lb (101.6 kg), SpO2 94 %. Gen:      No acute distress HEENT:  EOMI, sclera anicteric Neck:     No masses; no thyromegaly Lungs:    Clear to auscultation bilaterally; normal respiratory effort CV:         Regular rate and rhythm; no murmurs Abd:      + bowel sounds; soft, non-tender; no palpable masses, no distension Ext:    No edema; adequate peripheral perfusion Skin:      Warm and dry; no rash Neuro: alert and oriented x 3 Psych: normal mood and affect  Data Reviewed: CT angio chest 01/06/17 No pulmonary embolism, air-trapping, mild groundglass attenuation, basilar atelectasis.  I have reviewed the images personally  Echocardiogram 01/06/17 Mild hypertrophy of the septum, systolic function is normal.  EF 55-60%.  Grade 1 diastolic dysfunction.  Pulmonary systolic pressure is mildly increased.  FENO 02/10/17-9  PFTs 03/07/17 FVC 4.63 [94%], FEV1 3.52 [93%], F/F 76, TLC 104%, DLCO 90% Minimal airway obstruction, small airway disease.  CBC 02/10/17-WBC 8.6, eos 2.6%, absolute eosinophil count  200 Blood allergy profile 02/10/17-IgE 9, RAST panel mildly positive for box elder  Home sleep study 03/07/17-severe sleep apnea with AHI 66.  Oxygen desaturations  Assessment:  Consult for abnormal CT scan CT images showed mosaic attenuation consistent with air trapping.  This could be secondary to emphysema given his smoking history.  Suspicion for asthma is low.  PFTs showed no overt obstruction by F/F criteria.  He will not need any inhalers as he is asymptomatic. He likely has diastolic dysfunction, mild pulmonary hypertension.  Pulmonary edema can give similar findings on CT scan.  He has follow-up with Dr. Jacinto HalimGanji for further evaluation.  Severe sleep apnea Awaiting CPAP titration study.  He will let us know what his insurance is restored and will place an order for this.  Plan/Recommendations: -CPAP titration study.  Chilton GreathousePraveen Cornella Emmer MD [facility] 06/20/2017, 11:35 AM  CC: [place], [place]*

## 2018-01-17 DIAGNOSIS — R55 Syncope and collapse: Secondary | ICD-10-CM

## 2018-01-17 HISTORY — DX: Syncope and collapse: R55

## 2018-05-25 ENCOUNTER — Encounter: Payer: Self-pay | Admitting: Gastroenterology

## 2018-09-05 ENCOUNTER — Emergency Department (HOSPITAL_COMMUNITY)
Admission: EM | Admit: 2018-09-05 | Discharge: 2018-09-06 | Disposition: A | Discharge status: Home / Self Care | Payer: BLUE CROSS/BLUE SHIELD | Attending: Emergency Medicine | Admitting: Emergency Medicine

## 2018-09-05 ENCOUNTER — Emergency Department (HOSPITAL_COMMUNITY): Payer: BLUE CROSS/BLUE SHIELD

## 2018-09-05 DIAGNOSIS — I10 Essential (primary) hypertension: Secondary | ICD-10-CM | POA: Diagnosis not present

## 2018-09-05 DIAGNOSIS — R1033 Periumbilical pain: Secondary | ICD-10-CM | POA: Diagnosis present

## 2018-09-05 DIAGNOSIS — R197 Diarrhea, unspecified: Secondary | ICD-10-CM | POA: Diagnosis not present

## 2018-09-05 DIAGNOSIS — Z79899 Other long term (current) drug therapy: Secondary | ICD-10-CM | POA: Diagnosis not present

## 2018-09-05 DIAGNOSIS — Z87891 Personal history of nicotine dependence: Secondary | ICD-10-CM | POA: Diagnosis not present

## 2018-09-05 DIAGNOSIS — K439 Ventral hernia without obstruction or gangrene: Secondary | ICD-10-CM | POA: Diagnosis not present

## 2018-09-05 LAB — COMPREHENSIVE METABOLIC PANEL
ALT: 20 U/L (ref 0–44)
AST: 18 U/L (ref 15–41)
Albumin: 3.7 g/dL (ref 3.5–5.0)
Alkaline Phosphatase: 58 U/L (ref 38–126)
Anion gap: 9 (ref 5–15)
BUN: 10 mg/dL (ref 6–20)
CO2: 23 mmol/L (ref 22–32)
Calcium: 9 mg/dL (ref 8.9–10.3)
Chloride: 106 mmol/L (ref 98–111)
Creatinine, Ser: 0.75 mg/dL (ref 0.61–1.24)
GFR calc Af Amer: >60 mL/min (ref >60)
GFR calc non Af Amer: >60 mL/min (ref >60)
Glucose, Bld: 98 mg/dL (ref 70–99)
Potassium: 3.7 mmol/L (ref 3.5–5.1)
Sodium: 138 mmol/L (ref 135–145)
Total Bilirubin: 0.4 mg/dL (ref 0.3–1.2)
Total Protein: 6.5 g/dL (ref 6.5–8.1)

## 2018-09-05 LAB — CBC WITH DIFFERENTIAL/PLATELET
Abs Immature Granulocytes: 0.01 10*3/uL (ref 0.00–0.07)
Basophils Absolute: 0 10*3/uL (ref 0.0–0.1)
Basophils Relative: 1 %
Eosinophils Absolute: 0.3 10*3/uL (ref 0.0–0.5)
Eosinophils Relative: 5 %
HCT: 44.7 % (ref 39.0–52.0)
Hemoglobin: 14.6 g/dL (ref 13.0–17.0)
Immature Granulocytes: 0 %
Lymphocytes Relative: 31 %
Lymphs Abs: 2 10*3/uL (ref 0.7–4.0)
MCH: 31.4 pg (ref 26.0–34.0)
MCHC: 32.7 g/dL (ref 30.0–36.0)
MCV: 96.1 fL (ref 80.0–100.0)
Monocytes Absolute: 0.8 10*3/uL (ref 0.1–1.0)
Monocytes Relative: 13 %
Neutro Abs: 3.3 10*3/uL (ref 1.7–7.7)
Neutrophils Relative %: 50 %
Platelets: 166 10*3/uL (ref 150–400)
RBC: 4.65 MIL/uL (ref 4.22–5.81)
RDW: 14.3 % (ref 11.5–15.5)
WBC: 6.5 10*3/uL (ref 4.0–10.5)
nRBC: 0 % (ref 0.0–0.2)

## 2018-09-05 LAB — LACTIC ACID, PLASMA: Lactic Acid, Venous: 0.8 mmol/L (ref 0.5–1.9)

## 2018-09-05 MED ORDER — HYDROCODONE-ACETAMINOPHEN 5-325 MG PO TABS
1.0000 | ORAL_TABLET | Freq: Once | ORAL | Status: AC
  Administered 2018-09-06: 1 via ORAL
  Filled 2018-09-05: qty 1

## 2018-09-05 NOTE — ED Provider Notes (Signed)
[facility] Provider Note  CSN: [id number] Arrival date & time: 09/05/18 1801  Chief Complaint(s) No chief complaint on file.  HPI Justin Young is a 58 y.o. male  Who has been having gradually worsening periumbilical aching abdominal pain for 4 days.  Pain is worse with palpation.  Has not tried taking any medicine for the pain. Additionally patient reports having watery diarrhea for 4 days.  Has been improving over the past day.  Reports possible suspicious food intake the day prior.  No alleviating or aggravating factors.  No nausea or vomiting.  No fevers or chills.  No chest pain or shortness of breath.  Patient saw his PCP who ordered a CT scan.  It revealed a fat-containing ventral hernia. Also showed likely gastritis. No other acute process.  Sent here for evaluation.  HPI  Past Medical History Past Medical History:  Diagnosis Date  . Arthritis   . Chest pain   . GERD (gastroesophageal reflux disease)    mild -rolaids if necessary  . Hypertension   . Insomnia   . 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 Syncope    Patient Active Problem List   Diagnosis Date Noted  . OSA (obstructive sleep apnea) 03/13/2017  . Episode of unresponsiveness 01/06/2017  . Chest pain 01/06/2017  . Syncope 01/06/2017  . Spinal stenosis, lumbar region with neurogenic claudication 03/16/2016  . Tick bite 07/24/2015  . Viral illness 07/24/2015  . Eustachian tube dysfunction 05/28/2015  . Hearing loss 05/28/2015  . Routine general medical examination at a health care facility 09/09/2014  . Rib pain on right side 09/09/2014  . History of colon polyps 03/08/2012  . Nonspecific (abnormal) findings on radiological and other examination of gastrointestinal tract 03/07/2012  . Essential hypertension 02/25/2012  . Osteoarthritis of left knee 01/27/2012   Home Medication(s) Prior to Admission medications   Medication Sig Start Date End Date Taking? Authorizing  Provider  acetaminophen (TYLENOL) 500 MG tablet Take 2 tablets (1,000 mg total) by mouth every 8 (eight) hours for 5 days. Do not take more than 4000 mg of acetaminophen (Tylenol) in a 24-hour period. Please note that other medicines that you may be prescribed may have Tylenol as well. 09/06/18 09/11/18  Julyanna Scholle, Amadeo GarnetPedro Eduardo, MD  amLODipine (NORVASC) 5 MG tablet Take 5 mg by mouth daily. 02/18/16   [provider]  HYDROcodone-acetaminophen (NORCO/VICODIN) 5-325 MG tablet Take 1 tablet by mouth every 8 (eight) hours as needed for up to 5 days for severe pain (That is not improved by your scheduled acetaminophen regimen). Please do not exceed 4000 mg of acetaminophen (Tylenol) a 24-hour period. Please note that he may be prescribed additional medicine that contains acetaminophen. 09/06/18 09/11/18  Nira Connardama, Kenly Henckel Eduardo, MD  ibuprofen (ADVIL,MOTRIN) 200 MG tablet Take 800 mg by mouth 2 (two) times daily as needed (for pain.).    [provider]  loratadine (CLARITIN) 10 MG tablet Take 10 mg by mouth daily.     [provider]  metoprolol (LOPRESSOR) 50 MG tablet Take 50 mg by mouth 2 (two) times daily.  11/16/15   [provider]  omeprazole (PRILOSEC) 20 MG capsule Take 20 mg by mouth daily as needed (acid reflux).    [provider]  Past Surgical History Past Surgical History:  Procedure Laterality Date  . ANKLE RECONSTRUCTION  left   3 procedures total  . CLAVICLE SURGERY     bilateral  . KNEE ARTHROSCOPY  2006-most current   left knee X 2 1 ON RIGHT  . KNEE ARTHROSCOPY WITH MEDIAL MENISECTOMY  01/27/2012   Procedure: KNEE ARTHROSCOPY WITH MEDIAL MENISECTOMY;  Surgeon: Jacki Conesonald A Gioffre, MD;  [facility];  Service: Orthopedics;  Laterality: Left;  . LUMBAR LAMINECTOMY/DECOMPRESSION MICRODISCECTOMY N/A  03/16/2016   Procedure: CENTRAL DECOMPRESSIVE LUMBAR LAMINECTOMY LUMBAR FOUR TO LUMBAR FIVE;  Surgeon: Ranee Gosselinonald Gioffre, MD;  Location: [facility];  Service: Orthopedics;  Laterality: N/A;  . TONSILLECTOMY  AS CHILD   Family History Family History  Problem Relation Age of Onset  . Arthritis Mother   . Cancer Mother        Ovarian cancer primary,colon cancer.  . Diabetes Mother   . Colon cancer Mother   . Alcohol abuse Father   . Hyperlipidemia Father   . Hypertension Father   . Heart disease Father   . Heart disease Sister   . Hypertension Sister   . Sleep apnea Sister   . Arthritis Maternal Grandmother     Social History Social History   Tobacco Use  . Smoking status: Former Smoker    Packs/day: 3.00    Years: 30.00    Pack years: 90.00    Quit date: 01/25/2007    Years since quitting: 11.6  . Smokeless tobacco: Never Used  Substance Use Topics  . Alcohol use: Yes    Alcohol/week: 7.0 standard drinks    Types: 7 Shots of liquor per week    Comment: occ  . Drug use: No   Allergies Sulfa antibiotics and Codeine  Review of Systems Review of Systems All other systems are reviewed and are negative for acute change except as noted in the HPI  Physical Exam Vital Signs  I have reviewed the triage vital signs BP (!) 177/95   Pulse (!) 46   Temp 98.7 F (37.1 C) (Oral)   Resp 16   SpO2 97%   Physical Exam Vitals signs reviewed.  Constitutional:      General: He is not in acute distress.    Appearance: He is well-developed. He is not diaphoretic.  HENT:     Head: Normocephalic and atraumatic.     Jaw: No trismus.     Right Ear: External ear normal.     Left Ear: External ear normal.     Nose: Nose normal.  Eyes:     General: No scleral icterus.    Conjunctiva/sclera: Conjunctivae normal.  Neck:     Musculoskeletal: Normal range of motion.     Trachea: Phonation normal.  Cardiovascular:     Rate and Rhythm: Normal rate and regular rhythm.  Pulmonary:      Effort: Pulmonary effort is normal. No respiratory distress.     Breath sounds: No stridor.  Abdominal:     General: There is no distension.     Tenderness: There is abdominal tenderness (mild discomfort) in the epigastric area and periumbilical area. There is no guarding or rebound.  Musculoskeletal: Normal range of motion.  Neurological:     Mental Status: He is alert and oriented to person, place, and time.  Psychiatric:        Behavior: Behavior normal.     ED Results and Treatments Labs (all labs ordered are listed, but only abnormal results  are displayed) Labs Reviewed  CBC WITH DIFFERENTIAL/PLATELET  COMPREHENSIVE METABOLIC PANEL  LACTIC ACID, PLASMA  LACTIC ACID, PLASMA                                                                                                                         EKG  EKG Interpretation  Date/Time:    Ventricular Rate:    PR Interval:    QRS Duration:   QT Interval:    QTC Calculation:   R Axis:     Text Interpretation:        Radiology No results found.  Pertinent labs & imaging results that were available during my care of the patient were reviewed by me and considered in my medical decision making (see chart for details).  Medications Ordered in ED Medications  HYDROcodone-acetaminophen (NORCO/VICODIN) 5-325 MG per tablet 1 tablet (1 tablet Oral Given 09/06/18 0013)                                                                                                                                    Procedures Procedures  (including critical care time)  Medical Decision Making / ED Course I have reviewed the nursing notes for this encounter and the patient's prior records (if available in EHR or on provided paperwork).   MACRAE WIEGMAN was evaluated in Emergency Department on 09/06/2018 for the symptoms described in the history of present illness. He was evaluated in the context of the global COVID-19 pandemic, which necessitated  consideration that the patient might be at risk for infection with the SARS-CoV-2 virus that causes COVID-19. Institutional protocols and algorithms that pertain to the evaluation of patients at risk for COVID-19 are in a state of rapid change based on information released by regulatory bodies including the CDC and federal and state organizations. These policies and algorithms were followed during the patient's care in the ED.  1. abd pain Ventral hernia containing only fat. No SBO.  Labs grossly reassuring.  No need for repeat imaging or surgical consult at this time.  Pain control.  2.  Diarrhea. Appears to be related to suspicious food intake.  Reports improving symptoms for 1 day. Labs without electrolyte derangements or renal sufficiency.  No leukocytosis or anemia.  No evidence of bili obstruction or pancreatitis.  The patient appears reasonably screened and/or stabilized for discharge and I doubt any other medical condition or other  [facility] requiring further screening, evaluation, or treatment in the ED at this time prior to discharge. The patient is safe for discharge with strict return precautions.       Final Clinical Impression(s) / ED Diagnoses Final diagnoses:  Fatty hernia of linea alba  Diarrhea of presumed infectious origin    The patient appears reasonably screened and/or stabilized for discharge and I doubt any other medical condition or other [facility] requiring further screening, evaluation, or treatment in the ED at this time prior to discharge.  Disposition: Discharge  Condition: Good  I have discussed the results, Dx and Tx plan with the patient who expressed understanding and agree(s) with the plan. Discharge instructions discussed at great length. The patient was given strict return precautions who verbalized understanding of the instructions. No further questions at time of discharge.    ED Discharge Orders         Ordered    acetaminophen (TYLENOL) 500 MG tablet   Every 8 hours     09/06/18 0007    HYDROcodone-acetaminophen (NORCO/VICODIN) 5-325 MG tablet  Every 8 hours PRN     09/06/18 0007            Follow Up: [place], [facility] [address] [place] [id number] [phone]  Schedule an appointment as soon as possible for a visit  As needed      This chart was dictated using voice recognition software.  Despite best efforts to proofread,  errors can occur which can change the documentation meaning.   Nira Connardama, Marcques Wrightsman Eduardo, MD 09/06/18 [phone]

## 2018-09-05 NOTE — ED Triage Notes (Signed)
Pt here from home told to come to the ED for a bowel obstruction and surgery consult , pt had an out pt ct scan of abd

## 2018-09-06 MED ORDER — ACETAMINOPHEN 500 MG PO TABS: 1000.0000 mg | ORAL_TABLET | Freq: Three times a day (TID) | ORAL | 0 refills | Status: AC

## 2018-09-06 MED ORDER — HYDROCODONE-ACETAMINOPHEN 5-325 MG PO TABS: 1.0000 | ORAL_TABLET | Freq: Three times a day (TID) | ORAL | 0 refills | Status: AC | PRN

## 2018-09-06 NOTE — ED Notes (Signed)
Patient verbalizes understanding of discharge instructions. Opportunity for questioning and answers were provided. Armband removed by staff, pt discharged from ED ambulatory.   

## 2019-03-21 ENCOUNTER — Ambulatory Visit: Payer: BLUE CROSS/BLUE SHIELD | Attending: Internal Medicine

## 2019-03-21 DIAGNOSIS — Z23 Encounter for immunization: Secondary | ICD-10-CM | POA: Insufficient documentation

## 2019-03-21 NOTE — Progress Notes (Signed)
   Covid-19 Vaccination Clinic  Name:  Justin Young    MRN: [id number] DOB: 1960/05/26  03/21/2019  Justin Young was observed post Covid-19 immunization for 15 minutes without incident. He was provided with Vaccine Information Sheet and instruction to access the V-Safe system.   Justin Young was instructed to call 911 with any severe reactions post vaccine: Marland Kitchen Difficulty breathing  . Swelling of face and throat  . A fast heartbeat  . A bad rash all over body  . Dizziness and weakness

## 2019-04-23 ENCOUNTER — Ambulatory Visit: Payer: BLUE CROSS/BLUE SHIELD | Attending: Internal Medicine

## 2019-04-23 DIAGNOSIS — Z23 Encounter for immunization: Secondary | ICD-10-CM

## 2019-04-23 NOTE — Progress Notes (Signed)
   Covid-19 Vaccination Clinic  Name:  Justin Young    MRN: [id number] DOB: Jan 21, 1960  04/23/2019  Justin Young was observed post Covid-19 immunization for 15 minutes without incident. He was provided with Vaccine Information Sheet and instruction to access the V-Safe system.   Justin Young was instructed to call 911 with any severe reactions post vaccine: Marland Kitchen Difficulty breathing  . Swelling of face and throat  . A fast heartbeat  . A bad rash all over body  . Dizziness and weakness   Immunizations Administered    Name Date Dose VIS Date Route   Moderna COVID-19 Vaccine 04/23/2019 10:16 AM 0.5 mL 12/18/2018 Intramuscular   Manufacturer: Moderna   Lot: [id number]   NDC: 78978-478-41

## 2019-05-01 ENCOUNTER — Emergency Department (HOSPITAL_COMMUNITY)
Admission: EM | Admit: 2019-05-01 | Discharge: 2019-05-01 | Disposition: A | Discharge status: Home / Self Care | Payer: BLUE CROSS/BLUE SHIELD | Attending: Emergency Medicine | Admitting: Emergency Medicine

## 2019-05-01 ENCOUNTER — Emergency Department (HOSPITAL_COMMUNITY): Payer: BLUE CROSS/BLUE SHIELD

## 2019-05-01 ENCOUNTER — Other Ambulatory Visit: Payer: Self-pay

## 2019-05-01 ENCOUNTER — Encounter (HOSPITAL_COMMUNITY): Payer: Self-pay

## 2019-05-01 DIAGNOSIS — I10 Essential (primary) hypertension: Secondary | ICD-10-CM | POA: Insufficient documentation

## 2019-05-01 DIAGNOSIS — Z79899 Other long term (current) drug therapy: Secondary | ICD-10-CM | POA: Insufficient documentation

## 2019-05-01 DIAGNOSIS — N201 Calculus of ureter: Secondary | ICD-10-CM | POA: Diagnosis not present

## 2019-05-01 DIAGNOSIS — Z87891 Personal history of nicotine dependence: Secondary | ICD-10-CM | POA: Diagnosis not present

## 2019-05-01 DIAGNOSIS — N50819 Testicular pain, unspecified: Secondary | ICD-10-CM

## 2019-05-01 DIAGNOSIS — N50811 Right testicular pain: Secondary | ICD-10-CM | POA: Diagnosis present

## 2019-05-01 LAB — CBC WITH DIFFERENTIAL/PLATELET
Abs Immature Granulocytes: 0.02 10*3/uL (ref 0.00–0.07)
Basophils Absolute: 0.1 10*3/uL (ref 0.0–0.1)
Basophils Relative: 1 %
Eosinophils Absolute: 0.1 10*3/uL (ref 0.0–0.5)
Eosinophils Relative: 1 %
HCT: 47 % (ref 39.0–52.0)
Hemoglobin: 15.4 g/dL (ref 13.0–17.0)
Immature Granulocytes: 0 %
Lymphocytes Relative: 13 %
Lymphs Abs: 1.5 10*3/uL (ref 0.7–4.0)
MCH: 30.7 pg (ref 26.0–34.0)
MCHC: 32.8 g/dL (ref 30.0–36.0)
MCV: 93.8 fL (ref 80.0–100.0)
Monocytes Absolute: 0.8 10*3/uL (ref 0.1–1.0)
Monocytes Relative: 7 %
Neutro Abs: 8.8 10*3/uL — ABNORMAL HIGH (ref 1.7–7.7)
Neutrophils Relative %: 78 %
Platelets: 202 10*3/uL (ref 150–400)
RBC: 5.01 MIL/uL (ref 4.22–5.81)
RDW: 14.3 % (ref 11.5–15.5)
WBC: 11.2 10*3/uL — ABNORMAL HIGH (ref 4.0–10.5)
nRBC: 0 % (ref 0.0–0.2)

## 2019-05-01 LAB — COMPREHENSIVE METABOLIC PANEL
ALT: 23 U/L (ref 0–44)
AST: 25 U/L (ref 15–41)
Albumin: 4.6 g/dL (ref 3.5–5.0)
Alkaline Phosphatase: 64 U/L (ref 38–126)
Anion gap: 10 (ref 5–15)
BUN: 24 mg/dL — ABNORMAL HIGH (ref 6–20)
CO2: 25 mmol/L (ref 22–32)
Calcium: 9.4 mg/dL (ref 8.9–10.3)
Chloride: 106 mmol/L (ref 98–111)
Creatinine, Ser: 0.93 mg/dL (ref 0.61–1.24)
GFR calc Af Amer: >60 mL/min (ref >60)
GFR calc non Af Amer: >60 mL/min (ref >60)
Glucose, Bld: 125 mg/dL — ABNORMAL HIGH (ref 70–99)
Potassium: 4.4 mmol/L (ref 3.5–5.1)
Sodium: 141 mmol/L (ref 135–145)
Total Bilirubin: 0.6 mg/dL (ref 0.3–1.2)
Total Protein: 7.5 g/dL (ref 6.5–8.1)

## 2019-05-01 MED ORDER — ONDANSETRON 8 MG PO TBDP: 8.0000 mg | ORAL_TABLET | Freq: Three times a day (TID) | ORAL | 0 refills | Status: DC | PRN

## 2019-05-01 MED ORDER — KETOROLAC TROMETHAMINE 30 MG/ML IJ SOLN
15.0000 mg | Freq: Once | INTRAMUSCULAR | Status: AC
  Administered 2019-05-01: 15 mg via INTRAVENOUS
  Filled 2019-05-01: qty 1

## 2019-05-01 MED ORDER — ONDANSETRON HCL 4 MG/2ML IJ SOLN
4.0000 mg | Freq: Once | INTRAMUSCULAR | Status: AC
  Administered 2019-05-01: 14:00:00 4 mg via INTRAVENOUS
  Filled 2019-05-01: qty 2

## 2019-05-01 MED ORDER — ACETAMINOPHEN ER 650 MG PO TBCR: 650.0000 mg | EXTENDED_RELEASE_TABLET | Freq: Three times a day (TID) | ORAL | 0 refills | Status: DC | PRN

## 2019-05-01 MED ORDER — HYDROCODONE-ACETAMINOPHEN 5-325 MG PO TABS: 2.0000 | ORAL_TABLET | Freq: Three times a day (TID) | ORAL | 0 refills | Status: AC | PRN

## 2019-05-01 MED ORDER — IBUPROFEN 600 MG PO TABS: 600.0000 mg | ORAL_TABLET | Freq: Four times a day (QID) | ORAL | 0 refills | Status: DC | PRN

## 2019-05-01 MED ORDER — HYDROMORPHONE HCL 1 MG/ML IJ SOLN
1.0000 mg | INTRAMUSCULAR | Status: DC | PRN
  Administered 2019-05-01: 1 mg via INTRAVENOUS
  Filled 2019-05-01: qty 1

## 2019-05-01 NOTE — ED Provider Notes (Signed)
[facility]-EMERGENCY DEPT Provider Note   CSN: [id number] Arrival date & time: 05/01/19  1216     History Chief Complaint  Patient presents with  . Testicle Pain    Justin Young is a 59 y.o. male.  HPI     59 year old back pain.  Patient reports sudden onset of testicular pain on the right side and lower back pain.  He has no history of similar pain in the past.  He has associated nausea and diaphoresis.  Patient denies any history of kidney stones and denies any trauma.  He has no UTI-like symptoms or any major abdominal surgeries.  Past Medical History:  Diagnosis Date  . Arthritis   . Chest pain   . GERD (gastroesophageal reflux disease)    mild -rolaids if necessary  . Hypertension   . Insomnia   . 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 Syncope     Patient Active Problem List   Diagnosis Date Noted  . OSA (obstructive sleep apnea) 03/13/2017  . Episode of unresponsiveness 01/06/2017  . Chest pain 01/06/2017  . Syncope 01/06/2017  . Spinal stenosis, lumbar region with neurogenic claudication 03/16/2016  . Tick bite 07/24/2015  . Viral illness 07/24/2015  . Eustachian tube dysfunction 05/28/2015  . Hearing loss 05/28/2015  . Routine general medical examination at a health care facility 09/09/2014  . Rib pain on right side 09/09/2014  . History of colon polyps 03/08/2012  . Nonspecific (abnormal) findings on radiological and other examination of gastrointestinal tract 03/07/2012  . Essential hypertension 02/25/2012  . Osteoarthritis of left knee 01/27/2012    Past Surgical History:  Procedure Laterality Date  . ANKLE RECONSTRUCTION  left   3 procedures total  . CLAVICLE SURGERY     bilateral  . KNEE ARTHROSCOPY  2006-most current   left knee X 2 1 ON RIGHT  . KNEE ARTHROSCOPY WITH MEDIAL MENISECTOMY  01/27/2012   Procedure: KNEE ARTHROSCOPY WITH MEDIAL MENISECTOMY;  Surgeon: Jacki Cones, MD;  Location: [facility];   Service: Orthopedics;  Laterality: Left;  . LUMBAR LAMINECTOMY/DECOMPRESSION MICRODISCECTOMY N/A 03/16/2016   Procedure: CENTRAL DECOMPRESSIVE LUMBAR LAMINECTOMY LUMBAR FOUR TO LUMBAR FIVE;  Surgeon: Ranee Gosselin, MD;  Location: [facility];  Service: Orthopedics;  Laterality: N/A;  . TONSILLECTOMY  AS CHILD       Family History  Problem Relation Age of Onset  . Arthritis Mother   . Cancer Mother        Ovarian cancer primary,colon cancer.  . Diabetes Mother   . Colon cancer Mother   . Alcohol abuse Father   . Hyperlipidemia Father   . Hypertension Father   . Heart disease Father   . Heart disease Sister   . Hypertension Sister   . Sleep apnea Sister   . Arthritis Maternal Grandmother     Social History   Tobacco Use  . Smoking status: Former Smoker    Packs/day: 3.00    Years: 30.00    Pack years: 90.00    Quit date: 01/25/2007    Years since quitting: 12.2  . Smokeless tobacco: Never Used  Substance Use Topics  . Alcohol use: Yes    Alcohol/week: 7.0 standard drinks    Types: 7 Shots of liquor per week    Comment: occ  . Drug use: No    Home Medications Prior to Admission medications   Medication Sig Start Date End Date Taking? Authorizing Provider  amLODipine (NORVASC) 5 MG  tablet Take 5 mg by mouth daily. 02/18/16  Yes [provider]  clonazePAM (KLONOPIN) 0.5 MG tablet Take 0.5 mg by mouth at bedtime as needed for sleep. 04/23/19  Yes [provider]  loratadine (CLARITIN) 10 MG tablet Take 10 mg by mouth daily.    Yes [provider]  meloxicam (MOBIC) 15 MG tablet Take 15 mg by mouth daily. 04/29/19  Yes [provider]  metoprolol tartrate (LOPRESSOR) 25 MG tablet Take 25 mg by mouth 2 (two) times daily.   Yes [provider]  omeprazole (PRILOSEC) 20 MG capsule Take 20 mg by mouth daily at 6 PM.    Yes [provider]  acetaminophen (TYLENOL 8 HOUR) 650 MG CR tablet Take 1 tablet (650 mg total) by mouth every 8  (eight) hours as needed. 05/01/19   Varney Biles, MD  HYDROcodone-acetaminophen (NORCO/VICODIN) 5-325 MG tablet Take 2 tablets by mouth every 8 (eight) hours as needed for up to 3 days. 05/01/19 05/04/19  Varney Biles, MD  ibuprofen (ADVIL) 600 MG tablet Take 1 tablet (600 mg total) by mouth every 6 (six) hours as needed. 05/01/19   Varney Biles, MD  ondansetron (ZOFRAN ODT) 8 MG disintegrating tablet Take 1 tablet (8 mg total) by mouth every 8 (eight) hours as needed for nausea. 05/01/19   Varney Biles, MD    Allergies    Sulfa antibiotics and Codeine  Review of Systems   Review of Systems  Constitutional: Positive for activity change and diaphoresis.  Respiratory: Negative for shortness of breath.   Cardiovascular: Negative for chest pain.  Gastrointestinal: Positive for abdominal pain.  All other systems reviewed and are negative.   Physical Exam Updated Vital Signs BP (!) 172/88   Pulse 62   Temp 98.3 F (36.8 C) (Oral)   Resp 16   Ht 5\' 10"  (1.778 m)   Wt 97.5 kg   SpO2 98%   BMI 30.85 kg/m   Physical Exam Vitals and nursing note reviewed.  Constitutional:      Appearance: He is well-developed.  HENT:     Head: Atraumatic.  Cardiovascular:     Rate and Rhythm: Normal rate.  Pulmonary:     Effort: Pulmonary effort is normal.  Musculoskeletal:     Cervical back: Neck supple.  Skin:    General: Skin is warm.  Neurological:     Mental Status: He is alert and oriented to person, place, and time.     ED Results / Procedures / Treatments   Labs (all labs ordered are listed, but only abnormal results are displayed) Labs Reviewed  CBC WITH DIFFERENTIAL/PLATELET - Abnormal; Notable for the following components:      Result Value   WBC 11.2 (*)    Neutro Abs 8.8 (*)    All other components within normal limits  COMPREHENSIVE METABOLIC PANEL - Abnormal; Notable for the following components:   Glucose, Bld 125 (*)    BUN 24 (*)    All other components  within normal limits  URINALYSIS, ROUTINE W REFLEX MICROSCOPIC    EKG None  Radiology US Scrotum  Result Date: 05/01/2019 CLINICAL DATA:  Sudden severe right scrotal pain EXAM: SCROTAL ULTRASOUND DOPPLER ULTRASOUND OF THE TESTICLES TECHNIQUE: Complete ultrasound examination of the testicles, epididymis, and other scrotal structures was performed. Color and spectral Doppler ultrasound were also utilized to evaluate blood flow to the testicles. COMPARISON:  None. FINDINGS: Right testicle Measurements: 5.1 x 2.8 x 3.5 cm . No mass or  microlithiasis visualized. Left testicle Measurements: 4.8 x 3.0 x 4.0 cm. No mass or microlithiasis visualized. Right epididymis: Normal in size. Slightly heterogeneous in appearance. Left epididymis: Normal in size. Slightly heterogeneous in appearance. Hydrocele: Small bilateral hydroceles are present. Varicocele:  Bilateral varicoceles are present. Pulsed Doppler interrogation of both testes demonstrates normal low resistance arterial and venous waveforms bilaterally. IMPRESSION: No evidence of testicular torsion or other acute abnormality. Small bilateral hydroceles and varicoceles. Electronically Signed   By: Jonna Clark M.D.   On: 05/01/2019 15:28   CT Renal Stone Study  Result Date: 05/01/2019 CLINICAL DATA:  Right lower quadrant/flank pain EXAM: CT ABDOMEN AND PELVIS WITHOUT CONTRAST TECHNIQUE: Multidetector CT imaging of the abdomen and pelvis was performed following the standard protocol without oral or IV contrast. COMPARISON:  September 05, 2018 FINDINGS: Lower chest: There is bibasilar atelectatic change. No edema or airspace opacity in the lung bases. Hepatobiliary: No focal liver lesions are evident on this noncontrast enhanced study. Gallbladder wall is not appreciably thickened. There is no biliary duct dilatation. Pancreas: There is no pancreatic mass or inflammatory focus. Spleen: No splenic lesions are evident. Adrenals/Urinary Tract: Adrenals bilaterally  appear normal. The right kidney is edematous with soft tissue stranding in the right perinephric soft tissues. Mild fluid is seen in these areas as well. There is no evident renal mass on either side. There is moderate hydronephrosis on the right. There is no hydronephrosis on the left. There is no intrarenal calculus on either side. There is there is a 1 mm calculus in the distal right ureter near the ureterovesical junction, best seen on coronal and sagittal images with mild edema at the site of this calculus. There is edema more proximally, tracking along the right ureter as well. No other ureteral calculi evident. Urinary bladder is midline with wall thickness within normal limits. Stomach/Bowel: There are sigmoid diverticula without diverticulitis. There is no appreciable bowel wall or mesenteric thickening. The terminal ileum appears normal. No bowel obstruction. No free air or portal venous air. Vascular/Lymphatic: There is no abdominal aortic aneurysm. There is aortic and common iliac artery atherosclerosis. No adenopathy is evident in the abdomen or pelvis. Reproductive: There are a few small prostatic calculi. Prostate and seminal vesicles are normal in size and contour. No evident pelvic mass. Other: Appendix appears normal. No evident abscess or ascites in the abdomen or pelvis. There is mild fat in the umbilicus with small umbilical hernia. There is stable slight soft tissue stranding in this area. No bowel containing hernia evident. Musculoskeletal: No blastic or lytic bone lesions are evident. There is degenerative change in the lumbar spine and sacroiliac joint regions. No blastic or lytic bone lesions evident. No intramuscular lesions. IMPRESSION: 1. 1 mm calculus in the distal right ureter near the ureterovesical junction on the right. Localized distal ureteral edema on the right noted in this area. There is moderate hydronephrosis on the right as well as perinephric soft tissue stranding and fluid  on the right. 2. Sigmoid diverticula without diverticulitis. No bowel obstruction. No abscess in the abdomen or pelvis. Appendix appears normal. 3.  Aortic Atherosclerosis (ICD10-I70.0). 4. Small umbilical hernia containing fat and slight changes of panniculitis. No bowel containing hernia evident. Electronically Signed   By: Bretta Bang III M.D.   On: 05/01/2019 15:01    Procedures Procedures (including critical care time)  Medications Ordered in ED Medications  HYDROmorphone (DILAUDID) injection 1 mg (1 mg Intravenous Given 05/01/19 1404)  ondansetron (ZOFRAN) injection 4 mg (  4 mg Intravenous Given 05/01/19 1404)  ketorolac (TORADOL) 30 MG/ML injection 15 mg (15 mg Intravenous Given 05/01/19 1640)    ED Course  I have reviewed the triage vital signs and the nursing notes.  Pertinent labs & imaging results that were available during my care of the patient were reviewed by me and considered in my medical decision making (see chart for details).    MDM Rules/Calculators/A&P                      59 year old comes in a chief complaint of flank pain. Patient comes in with severe, sudden pain in his right testicle and right lower quadrant.  He is noted to have a kidney stone.  Results of the ED work-up discussed with the patient.  His pain is better.  He will get Toradol here and be discharged with appropriate pain control and strict ER return precautions.   Final Clinical Impression(s) / ED Diagnoses Final diagnoses:  Testicle pain  Ureteral stone    Rx / DC Orders ED Discharge Orders         Ordered    HYDROcodone-acetaminophen (NORCO/VICODIN) 5-325 MG tablet  Every 8 hours PRN     05/01/19 1644    ibuprofen (ADVIL) 600 MG tablet  Every 6 hours PRN     05/01/19 1645    acetaminophen (TYLENOL 8 HOUR) 650 MG CR tablet  Every 8 hours PRN     05/01/19 1645    ondansetron (ZOFRAN ODT) 8 MG disintegrating tablet  Every 8 hours PRN     05/01/19 1645           Derwood Kaplan, MD 05/01/19 1705

## 2019-05-01 NOTE — ED Triage Notes (Signed)
Patient reports a sudden onset of right testicle pain that radiates into the lower abdomen x 1 1/2 hours ago.

## 2019-05-01 NOTE — Discharge Instructions (Addendum)
We saw you in the ER for the abdominal pain. °Our results indicate that you have a kidney stone. °We were able to get your pain is relative control, and we can safely send you home. ° °Take the meds prescribed. °Set up an appointment with the Urologist. °If the pain is unbearable, you start having fevers, chills, and are unable to keep any meds down - then return to the ER. °  °

## 2020-07-02 ENCOUNTER — Other Ambulatory Visit: Payer: Self-pay

## 2020-07-02 ENCOUNTER — Emergency Department (HOSPITAL_COMMUNITY)
Admission: EM | Admit: 2020-07-02 | Discharge: 2020-07-02 | Disposition: A | Discharge status: Home / Self Care | Payer: BC Managed Care – PPO | Attending: Emergency Medicine | Admitting: Emergency Medicine

## 2020-07-02 DIAGNOSIS — Z79899 Other long term (current) drug therapy: Secondary | ICD-10-CM | POA: Diagnosis not present

## 2020-07-02 DIAGNOSIS — I4891 Unspecified atrial fibrillation: Secondary | ICD-10-CM | POA: Diagnosis not present

## 2020-07-02 DIAGNOSIS — Z87891 Personal history of nicotine dependence: Secondary | ICD-10-CM | POA: Diagnosis not present

## 2020-07-02 DIAGNOSIS — I10 Essential (primary) hypertension: Secondary | ICD-10-CM | POA: Diagnosis not present

## 2020-07-02 DIAGNOSIS — R002 Palpitations: Secondary | ICD-10-CM | POA: Diagnosis present

## 2020-07-02 DIAGNOSIS — I480 Paroxysmal atrial fibrillation: Secondary | ICD-10-CM

## 2020-07-02 DIAGNOSIS — R072 Precordial pain: Secondary | ICD-10-CM

## 2020-07-02 LAB — CBC
HCT: 47.3 % (ref 39.0–52.0)
Hemoglobin: 15.6 g/dL (ref 13.0–17.0)
MCH: 31.1 pg (ref 26.0–34.0)
MCHC: 33 g/dL (ref 30.0–36.0)
MCV: 94.2 fL (ref 80.0–100.0)
Platelets: 205 10*3/uL (ref 150–400)
RBC: 5.02 MIL/uL (ref 4.22–5.81)
RDW: 13.3 % (ref 11.5–15.5)
WBC: 7.7 10*3/uL (ref 4.0–10.5)
nRBC: 0 % (ref 0.0–0.2)

## 2020-07-02 LAB — BASIC METABOLIC PANEL
Anion gap: 11 (ref 5–15)
BUN: 16 mg/dL (ref 6–20)
CO2: 23 mmol/L (ref 22–32)
Calcium: 9.3 mg/dL (ref 8.9–10.3)
Chloride: 103 mmol/L (ref 98–111)
Creatinine, Ser: 0.81 mg/dL (ref 0.61–1.24)
GFR, Estimated: >60 mL/min (ref >60)
Glucose, Bld: 104 mg/dL — ABNORMAL HIGH (ref 70–99)
Potassium: 5.6 mmol/L — ABNORMAL HIGH (ref 3.5–5.1)
Sodium: 137 mmol/L (ref 135–145)

## 2020-07-02 LAB — TROPONIN I (HIGH SENSITIVITY)
Troponin I (High Sensitivity): 5 ng/L (ref <18)
Troponin I (High Sensitivity): 6 ng/L (ref <18)

## 2020-07-02 LAB — MAGNESIUM: Magnesium: 1.9 mg/dL (ref 1.7–2.4)

## 2020-07-02 MED ORDER — METOPROLOL TARTRATE 5 MG/5ML IV SOLN
10.0000 mg | Freq: Once | INTRAVENOUS | Status: AC
  Administered 2020-07-02: 10 mg via INTRAVENOUS
  Filled 2020-07-02: qty 10

## 2020-07-02 MED ORDER — RIVAROXABAN 20 MG PO TABS: 20.0000 mg | ORAL_TABLET | Freq: Every day | ORAL | 0 refills | Status: DC

## 2020-07-02 MED ORDER — RIVAROXABAN 20 MG PO TABS
20.0000 mg | ORAL_TABLET | Freq: Every day | ORAL | Status: DC
  Administered 2020-07-02: 20 mg via ORAL
  Filled 2020-07-02: qty 1

## 2020-07-02 NOTE — Consult Note (Addendum)
Cardiology Consultation:   Patient ID: Justin Young MRN: [id number]; DOB: 07/14/1960  Admit date: 07/02/2020 Date of Consult: 07/02/2020  [place]:  [place] Prov[facility] Cardiologist:  Jodelle RedBridgette Christopher, MD New1}     Patient Profile:   Justin Aoatrick J Qazi is a 60 y.o. male with a hx of HTN, OA, chest pain w/ neg trop and nl EF on echo 2018, insomnia, spinal stenosis s/p surgery, syncope w/ no cause found 2018, who is being seen 07/02/2020 for the evaluation of Afib at the request of Dr Jeraldine LootsLockwood.  History of Present Illness:   Justin Young was in his usual state of health yesterday.  He went to bed and woke up at 3 AM with palpitations and 5/10 chest pain.  He has never had chest pain before.  He has had brief palpitations lasting only a second or 2 in the past, has never been evaluated for them.  They do not come very often or last very long.  Early this morning, he was very uncomfortable both from the palpitations and the chest pain.  He describes the chest pain as a pressure.  He was a little short of breath, but not lightheaded or dizzy.  He tried to tough it out, but the symptoms did not resolve and he went to his PCPs office.    He thought his blood pressure was up, but when they checked him, they realized that his heart was out of rhythm.  His initial ECG was atrial fibrillation, heart rate 111.  He was sent to the emergency room.  In the emergency room, he had metoprolol 10 mg IV ordered but it has not been given yet.  He also has Xarelto ordered.  The palpitations made him anxious at first, and his blood pressure is a little elevated.  The chest pain has improved and it is currently a 2/10.  He does not know of any cause for his atrial fibrillation.  He has had no acute illness.  He drinks 2 caffeinated beverages a day and that has not changed recently.  He takes Claritin and is not sure whether or not it is Claritin-D.  He is willing  to switch to regular Claritin if it is Claritin-D.  He does not abuse alcohol.  He does not have a history of chest pain with exertion and in fact planted some trees, working very hard to take the holes.  He did this recently and had no issues with it.  He had a syncopal episode December 2018 after he came home from an office Christmas party.  He does not feel like he had excess alcohol that night.  He did not have palpitations that night.  His wife describes extreme diaphoresis, he said it felt like everything was going white and he lost consciousness.  He does not remember having any palpitations prior to that event.  At that time, he was referred to Dr. Jacinto HalimGanji for cardiology,  He had been on phentermine in the past, but stopped it in December 2017.   Past Medical History:  Diagnosis Date   Arthritis    Chest pain    GERD (gastroesophageal reflux disease)    mild -rolaids if necessary   Hypertension    Insomnia    Spinal stenosis at L4-L5 level    Syncope     Past Surgical History:  Procedure Laterality Date   ANKLE RECONSTRUCTION  left   3 procedures total   CLAVICLE SURGERY  bilateral   KNEE ARTHROSCOPY  2006-most current   left knee X 2 1 ON RIGHT   KNEE ARTHROSCOPY WITH MEDIAL MENISECTOMY  01/27/2012   Procedure: KNEE ARTHROSCOPY WITH MEDIAL MENISECTOMY;  Surgeon: Jacki Cones, MD;  Location: [facility];  Service: Orthopedics;  Laterality: Left;   LUMBAR LAMINECTOMY/DECOMPRESSION MICRODISCECTOMY N/A 03/16/2016   Procedure: CENTRAL DECOMPRESSIVE LUMBAR LAMINECTOMY LUMBAR FOUR TO LUMBAR FIVE;  Surgeon: Ranee Gosselin, MD;  Location: [facility];  Service: Orthopedics;  Laterality: N/A;   TONSILLECTOMY  AS CHILD     Home Medications:  Prior to Admission medications   Medication Sig Start Date End Date Taking? Authorizing Provider  clonazePAM (KLONOPIN) 0.5 MG tablet Take 0.5 mg by mouth at bedtime as needed for sleep. 04/23/19  Yes [provider]   loratadine (CLARITIN) 10 MG tablet Take 10 mg by mouth daily.   Yes [provider]  metoprolol tartrate (LOPRESSOR) 50 MG tablet Take 50 mg by mouth 2 (two) times daily. 06/08/20  Yes [provider]  omeprazole (PRILOSEC) 20 MG capsule Take 20 mg by mouth daily at 6 PM.   Yes [provider]  rivaroxaban (XARELTO) 20 MG TABS tablet Take 1 tablet (20 mg total) by mouth daily with supper. 07/02/20  Yes Gerhard Munch, MD  acetaminophen (TYLENOL 8 HOUR) 650 MG CR tablet Take 1 tablet (650 mg total) by mouth every 8 (eight) hours as needed. Patient not taking: No sig reported 05/01/19   Derwood Kaplan, MD  ibuprofen (ADVIL) 600 MG tablet Take 1 tablet (600 mg total) by mouth every 6 (six) hours as needed. Patient not taking: No sig reported 05/01/19   Derwood Kaplan, MD  ondansetron (ZOFRAN ODT) 8 MG disintegrating tablet Take 1 tablet (8 mg total) by mouth every 8 (eight) hours as needed for nausea. Patient not taking: Reported on 07/02/2020 05/01/19   Derwood Kaplan, MD    Inpatient Medications: Scheduled Meds:  rivaroxaban  20 mg Oral Q supper   Continuous Infusions:  PRN Meds:   Allergies:    Allergies  Allergen Reactions   Amlodipine Swelling and Other (See Comments)    Water retention   Sulfa Antibiotics Other (See Comments)    From when younger.   Codeine Itching    Social History:   Social History   Socioeconomic History   Marital status: Married    Spouse name: Gavin Pound   Number of children: 3   Years of education: 18   Highest education level: Not on file  Occupational History   Occupation: Careers adviser: [employer]    Comment: self-employed  Tobacco Use   Smoking status: Former    Packs/day: 3.00    Years: 30.00    Pack years: 90.00    Types: Cigarettes    Quit date: 01/25/2007    Years since quitting: 13.4   Smokeless tobacco: Never  Vaping Use   Vaping Use: Never used  Substance and Sexual Activity    Alcohol use: Yes    Alcohol/week: 7.0 standard drinks    Types: 7 Shots of liquor per week    Comment: occ   Drug use: No   Sexual activity: Yes    Partners: Female  Other Topics Concern   Not on file  Social History Narrative   [employer], [employer] - BS [place], [employer] -misc business classes.Married '84 wife Gavin Pound:    3 kids - g b g: '86, '88, '95. 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[employer]  systems, [employer] at [employer], [employer] tech.   Regular exercise: active      Colonoscopy 7 yrs ago   Social Determinants of Corporate investment banker Strain: Not on file  Food Insecurity: Not on file  Transportation Needs: Not on file  Physical Activity: Not on file  Stress: Not on file  Social Connections: Not on file  Intimate Partner Violence: Not on file    Family History:   Family History  Problem Relation Age of Onset   Arthritis Mother    Cancer Mother        Ovarian cancer primary,colon cancer.   Diabetes Mother    Colon cancer Mother    Alcohol abuse Father    Hyperlipidemia Father    Hypertension Father    Heart disease Father    Heart disease Sister    Hypertension Sister    Sleep apnea Sister    Arthritis Maternal Grandmother     Family Status  Relation Name Status   Mother  Alive   Father  Deceased   Sister  Alive   MGM  (Not Specified)     ROS:  Please see the history of present illness. All other ROS reviewed and negative.     Physical Exam/Data:   Vitals:   07/02/20 1330 07/02/20 1400 07/02/20 1532 07/02/20 1536  BP: (!) 146/120 (!) 141/102    Pulse: (!) 114 84  89  Resp: 15 16  19   Temp:      SpO2: 92% 92% (!) 89% 91%   No intake or output data in the 24 hours ending 07/02/20 1540 Last 3 Weights 05/01/2019 06/20/2017 03/13/2017  Weight (lbs) 215 lb 224 lb 232 lb 3.2 oz  Weight (kg) 97.523 kg 101.606 kg 105.325 kg     There is no height or weight on file to calculate BMI.  General:  Well nourished, well developed, in no acute distress HEENT: normal Lymph:  no adenopathy Neck: no JVD Endocrine:  No thryomegaly Vascular: No carotid bruits; FA pulses 2+ bilaterally without bruits  Cardiac:  normal S1, S2; Irreg R&R; no murmur  Lungs:  clear to auscultation bilaterally, no wheezing, rhonchi or rales  Abd: soft, nontender, no hepatomegaly  Ext: no edema Musculoskeletal:  No deformities, BUE and BLE strength normal and equal Skin: warm and dry  Neuro:  CNs 2-12 intact, no focal abnormalities noted Psych:  Normal affect   EKG:  The EKG was personally reviewed and demonstrates: Atrial fibrillation, heart rate 99, RBBB morphology with QRS duration 100 ms, Some morphology changes from 08/2018 ECG Telemetry:  Telemetry was personally reviewed and demonstrates: Atrial fibrillation, mostly RVR  Relevant CV Studies:  ECHO: 01/06/2017 - Left ventricle: The cavity size was normal. There was mild focal    basal hypertrophy of the septum. Systolic function was normal.    The estimated ejection fraction was in the range of 55% to 60%.    Wall motion was normal; there were no regional wall motion    abnormalities. Doppler parameters are consistent with abnormal    left ventricular relaxation (grade 1 diastolic dysfunction).  - Aortic valve: Mildly calcified annulus. Trileaflet; normal    thickness leaflets.  - Pulmonary arteries: Systolic pressure was mildly increased. PA    peak pressure: 34 mm Hg (S).   Laboratory Data:  High Sensitivity Troponin:   Recent Labs  Lab 07/02/20 1212  TROPONINIHS 5     Chemistry - Hemolyzed Recent Labs  Lab 07/02/20 1212  NA 137  K 5.6*  CL 103  CO2 23  GLUCOSE 104*  BUN 16  CREATININE 0.81  CALCIUM 9.3  GFRNONAA >60  ANIONGAP 11    No results for input(s): PROT, ALBUMIN, AST, ALT, ALKPHOS, BILITOT in the last 168 hours. Hematology Recent Labs  Lab 07/02/20 1212  WBC 7.7  RBC 5.02  HGB 15.6  HCT 47.3  MCV 94.2  MCH 31.1  MCHC 33.0  RDW 13.3  PLT 205   BNPNo results for input(s): BNP,  PROBNP in the last 168 hours.  DDimer No results for input(s): DDIMER in the last 168 hours. Lab Results  Component Value Date   TSH 1.242 01/06/2017   Lab Results  Component Value Date   HGBA1C 5.9 09/09/2014   Lab Results  Component Value Date   CHOL 179 09/09/2014   HDL 40.10 09/09/2014   LDLCALC 100 (H) 09/09/2014   LDLDIRECT 134.8 03/07/2012   TRIG 191.0 (H) 09/09/2014   CHOLHDL 4 09/09/2014     Radiology/Studies:  No results found.   Assessment and Plan:   Persistent atrial fibrillation, RVR -His heart rate is elevated and has likely been that way since 3 AM - He is symptomatic with the atrial fibrillation, even though his heart rate is generally less than 120 - He has been started on Xarelto for anticoagulation, CHA2DS2-VASc = 1 - Discuss admission versus increasing a beta-blocker and doing outpatient cardioversion - [facility] has been requested to perform a transesophageal echocardiogram on 06/20 for atrial fib.  After careful review of history and examination, the risks and benefits of transesophageal echocardiogram have been explained including risks of esophageal damage, perforation (1:10,000 risk), bleeding, pharyngeal hematoma as well as other potential complications associated with conscious sedation including aspiration, arrhythmia, respiratory failure and death. Alternatives to treatment were discussed, questions were answered. Patient and wife are willing to proceed.   2.  Chest pain: - His blood pressure is only mildly elevated and his heart rate is not extremely high - However, he continues to have chest pain. - The chest pain is been continuous since 3 AM without enzyme elevation - Recheck cardiac enzymes, check echo - MD advise on admission versus outpatient evaluation.   Risk Assessment/Risk Scores:     HEAR Score (for undifferentiated chest pain):  HEAR Score: 3    CHA2DS2-VASc Score = 1  This indicates a 0.6% annual risk of stroke. The  patient's score is based upon: CHF History: No HTN History: Yes Diabetes History: No Stroke History: No Vascular Disease History: No Age Score: 0 Gender Score: 0     For questions or updates, please contact [facility] Please consult [email or web address] for contact info under    Signed, Theodore Demark, PA-C  07/02/2020 3:40 PM

## 2020-07-02 NOTE — ED Provider Notes (Addendum)
Plan per cardiology is for Xarelto and outpatient follow-up for TEE.  Check 2 sets of troponins if negative plan for discharge. Physical Exam  BP (!) 163/92   Pulse (!) 56   Temp 97.9 F (36.6 C)   Resp (!) 22   SpO2 93%   Physical Exam  ED Course/Procedures     Procedures  MDM  At time of discharge at 19: 50 monitor shows sinus rhythm in the 50s.  Patient is alert in no distress.  Patient informed of the nature of paroxysmal atrial fibrillation.  He is to follow-up with cardiology for continued management.  Follow-up plan is in place.  Secure chat message sent to Dr. Janne Napoleon regarding change to normal sinus rhythm prior to discharge.        Arby Barrette, MD 07/02/20 Nolen Mu    Arby Barrette, MD 07/02/20 Eldred Manges    Arby Barrette, MD 07/02/20 2002

## 2020-07-02 NOTE — ED Notes (Signed)
Pulse ox site and probe changed to see if the ow  readings were  incorrect saturations did come up slightly will inform MD

## 2020-07-02 NOTE — Discharge Instructions (Addendum)
Please be sure to follow-up as discussed with our cardiology colleagues.  Nothing to eat/drink after midnight Sunday Take am meds as directed by [name] Come to the hospital on Monday morning by 8 am. Check in at admitting and tell them you have a cardioversion scheduled in endoscopy. Wear comfortable clothes Have someone to drive you and stay with you after discharge that day.

## 2020-07-02 NOTE — ED Triage Notes (Signed)
Chest discomfort this AM went to PCP and an EKG completed.  70-140 afib with RVR and HTN 220/150 2 SL nitro given 160/130 recently took off amlodipine and did not replace with a new medication. + SOB

## 2020-07-02 NOTE — ED Notes (Signed)
Patient verbalized understanding of discharge instructions. Opportunity for questions and answers.  

## 2020-07-02 NOTE — ED Provider Notes (Signed)
[facility] EMERGENCY DEPARTMENT Provider Note   CSN: [id number] Arrival date & time: 07/02/20  1146     History Chief Complaint  Patient presents with   Irregular Heart Beat    Justin Young is a 60 y.o. male.  HPI Patient presents via EMS with chest pain, palpitations.  Symptoms awaken him about 8 hours prior to ED arrival.  Symptoms were persistent, though not so uncomfortable he was not able to return to sleep for a bit. However, upon awakening again, he had persistent chest tightness, and palpitations.  He went to his physician's office, and there was found to have atrial fibrillation with rapid ventricular response.  He was sent here for evaluation. He notes ongoing chest pressure, no other pain, lightheadedness, syncope, nausea, vomiting. Notably, the patient was on metoprolol and amlodipine.  This latter medication was stopped due to fluid retention issues. That occurred about 2 weeks ago. EMS reports the patient had variable rate A. fib throughout transport, but was awake and alert.    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   Tick bite 07/24/2015   Viral illness 07/24/2015   Eustachian tube dysfunction 05/28/2015   Hearing loss 05/28/2015   Routine general medical examination at a health care facility 09/09/2014   Rib pain on right side 09/09/2014   History of colon polyps 03/08/2012   Nonspecific (abnormal) findings on radiological and other examination of gastrointestinal tract 03/07/2012   Essential hypertension  02/25/2012   Osteoarthritis of left knee 01/27/2012    Past Surgical History:  Procedure Laterality Date   ANKLE RECONSTRUCTION  left   3 procedures total   CLAVICLE SURGERY     bilateral   KNEE ARTHROSCOPY  2006-most current   left knee X 2 1 ON RIGHT   KNEE ARTHROSCOPY WITH MEDIAL MENISECTOMY  01/27/2012   Procedure: KNEE ARTHROSCOPY WITH MEDIAL MENISECTOMY;  Surgeon: Jacki Cones, MD;  [facility];  Service: Orthopedics;  Laterality: Left;   LUMBAR LAMINECTOMY/DECOMPRESSION MICRODISCECTOMY N/A 03/16/2016   Procedure: CENTRAL DECOMPRESSIVE LUMBAR LAMINECTOMY LUMBAR FOUR TO LUMBAR FIVE;  Surgeon: Ranee Gosselin, MD;  Location: [facility];  Service: Orthopedics;  Laterality: N/A;   TONSILLECTOMY  AS CHILD       Family History  Problem Relation Age of Onset   Arthritis Mother    Cancer Mother        Ovarian cancer primary,colon cancer.   Diabetes Mother    Colon cancer Mother    Alcohol abuse Father    Hyperlipidemia Father    Hypertension Father    Heart disease Father    Heart disease Sister    Hypertension Sister    Sleep apnea Sister    Arthritis Maternal Grandmother     Social History   Tobacco Use   Smoking status: Former    Packs/day: 3.00    Years: 30.00    Pack years: 90.00    Types: Cigarettes    Quit date: 01/25/2007  Years since quitting: 13.4   Smokeless tobacco: Never  Vaping Use   Vaping Use: Never used  Substance Use Topics   Alcohol use: Yes    Alcohol/week: 7.0 standard drinks    Types: 7 Shots of liquor per week    Comment: occ   Drug use: No    Home Medications Prior to Admission medications   Medication Sig Start Date End Date Taking? Authorizing Provider  clonazePAM (KLONOPIN) 0.5 MG tablet Take 0.5 mg by mouth at bedtime as needed for sleep. 04/23/19  Yes [provider]  loratadine (CLARITIN) 10 MG tablet Take 10 mg by mouth daily.   Yes [provider]  metoprolol tartrate (LOPRESSOR) 50 MG  tablet Take 50 mg by mouth 2 (two) times daily. 06/08/20  Yes [provider]  omeprazole (PRILOSEC) 20 MG capsule Take 20 mg by mouth daily at 6 PM.   Yes [provider]  acetaminophen (TYLENOL 8 HOUR) 650 MG CR tablet Take 1 tablet (650 mg total) by mouth every 8 (eight) hours as needed. Patient not taking: No sig reported 05/01/19   Derwood Kaplan, MD  ibuprofen (ADVIL) 600 MG tablet Take 1 tablet (600 mg total) by mouth every 6 (six) hours as needed. Patient not taking: No sig reported 05/01/19   Derwood Kaplan, MD  ondansetron (ZOFRAN ODT) 8 MG disintegrating tablet Take 1 tablet (8 mg total) by mouth every 8 (eight) hours as needed for nausea. Patient not taking: Reported on 07/02/2020 05/01/19   Derwood Kaplan, MD    Allergies    Amlodipine, Sulfa antibiotics, and Codeine  Review of Systems   Review of Systems  Constitutional:        Per HPI, otherwise negative  HENT:         Per HPI, otherwise negative  Respiratory:         Per HPI, otherwise negative  Cardiovascular:        Per HPI, otherwise negative  Gastrointestinal:  Negative for vomiting.  Endocrine:       Negative aside from HPI  Genitourinary:        Neg aside from HPI   Musculoskeletal:        Per HPI, otherwise negative  Skin: Negative.   Neurological:  Negative for syncope.   Physical Exam Updated Vital Signs BP (!) 141/102   Pulse 84   Temp 97.9 F (36.6 C)   Resp 16   SpO2 92%   Physical Exam Vitals and nursing note reviewed.  Constitutional:      General: He is not in acute distress.    Appearance: He is well-developed.  HENT:     Head: Normocephalic and atraumatic.  Eyes:     Conjunctiva/sclera: Conjunctivae normal.  Cardiovascular:     Rate and Rhythm: Normal rate. Rhythm irregular.  Pulmonary:     Effort: Pulmonary effort is normal. No respiratory distress.     Breath sounds: No stridor.  Abdominal:     General: There is no distension.  Skin:    General: Skin is warm  and dry.  Neurological:     Mental Status: He is alert and oriented to person, place, and time.    ED Results / Procedures / Treatments   Labs (all labs ordered are listed, but only abnormal results are displayed) Labs Reviewed  BASIC METABOLIC PANEL - Abnormal; Notable for the following components:      Result Value   Potassium 5.6 (*)    Glucose, Bld 104 (*)  All other components within normal limits  MAGNESIUM  CBC  TROPONIN I (HIGH SENSITIVITY)  TROPONIN I (HIGH SENSITIVITY)    EKG 99, A. fib, artifact, nonspecific ST wave changes, abnormal EKG.   Procedures Procedures   Medications Ordered in ED Medications  rivaroxaban (XARELTO) tablet 20 mg (has no administration in time range)    ED Course  I have reviewed the triage vital signs and the nursing notes.  Pertinent labs & imaging results that were available during my care of the patient were reviewed by me and considered in my medical decision making (see chart for details).  Cardiac 80s, A. fib, abnormal Pulse ox 99% room air normal  2:51 PM Patient accompanied by his wife. Together we discussed all findings he remains in no distress, his heart rate is variable 80s/110, A. fib. We confirmed his recent medication changes including cessation of amlodipine. Findings thus far notable for normal troponin, unremarkable electrolytes hemolyzed potassium included. Patient has no hypoxia, increased of breathing, lower suspicion for pneumonia.  Patient started on anticoagulant with pharmacy consult. Given the patient's current use of beta-blocker, with development of new A. fib in spite of this, I have discussed his case with our cardiology colleagues for consultation, and disposition is pending this consultation.   MDM Rules/Calculators/A&P MDM Number of Diagnoses or Management Options New onset atrial fibrillation [facility]): new, needed workup   Amount and/or Complexity of Data Reviewed Clinical lab tests: ordered  and reviewed Tests in the medicine section of CPT: reviewed and ordered Decide to obtain previous medical records or to obtain history from someone other than the patient: yes Obtain history from someone other than the patient: yes Review and summarize past medical records: yes Discuss the patient with other providers: yes Independent visualization of images, tracings, or specimens: yes  Risk of Complications, Morbidity, and/or Mortality Presenting problems: high Diagnostic procedures: high Management options: high  Critical Care Total time providing critical care: 30-74 minutes (35)  Patient Progress Patient progress: stable   Final Clinical Impression(s) / ED Diagnoses Final diagnoses:  New onset atrial fibrillation (HCC)    Rx / DC Orders ED Discharge Orders          Ordered    rivaroxaban (XARELTO) 20 MG TABS tablet  Daily with supper        07/02/20 1454             Gerhard Munch, MD 07/02/20 1455

## 2020-07-02 NOTE — ED Notes (Signed)
Pt. Denies SOB at this time when pt talks pt comes back to 90-94%.

## 2020-07-02 NOTE — Progress Notes (Signed)
ANTICOAGULATION CONSULT NOTE - Initial Consult  Pharmacy Consult for Xarelto Indication: atrial fibrillation  Allergies  Allergen Reactions   Amlodipine Swelling and Other (See Comments)    Water retention   Sulfa Antibiotics Other (See Comments)    From when younger.   Codeine Itching    Vital Signs: Temp: 97.9 F (36.6 C) (06/16 1200) BP: 141/102 (06/16 1400) Pulse Rate: 84 (06/16 1400)  Labs: Recent Labs    07/02/20 1212  HGB 15.6  HCT 47.3  PLT 205  CREATININE 0.81  TROPONINIHS 5    CrCl cannot be calculated (Unknown ideal weight.).   Medical History: Past Medical History:  Diagnosis Date   Arthritis    Chest pain    GERD (gastroesophageal reflux disease)    mild -rolaids if necessary   Hypertension    Insomnia    Spinal stenosis at L4-L5 level    Syncope     Assessment: 60 yo M presents via EMS with chest pain and palpitations. Found to be in Afib throughout transport. Was on amlodipine and metoprolol, amlodipine stopped recently because of fluid retention issues.  Goal of Therapy:  Monitor platelets by anticoagulation protocol: Yes   Plan:  Start Xarelto 20mg  PO daily for Afib anticoagulation.   , PharmD, BCCCP Emergency Medicine Clinical Pharmacist  Please check AMION for all [facility] phone numbers After 10:00 PM, call Main Pharmacy [phone]

## 2020-07-03 ENCOUNTER — Encounter (HOSPITAL_COMMUNITY): Payer: Self-pay | Admitting: Certified Registered Nurse Anesthetist

## 2020-07-03 ENCOUNTER — Telehealth: Payer: Self-pay | Admitting: *Deleted

## 2020-07-03 NOTE — Telephone Encounter (Signed)
Attempt to call patient to schedule EKG for Monday at 8 AM prior to TEE/Cardioversion  Called cell phone x 2 and home, no answer.   Left message for patient if able to come to office Monday morning at 8 AM for EKG prior to going to hospital for procedure.  If he does not get message, EKG to be completed at hospital.

## 2020-07-06 ENCOUNTER — Ambulatory Visit (INDEPENDENT_AMBULATORY_CARE_PROVIDER_SITE_OTHER): Payer: BC Managed Care – PPO | Admitting: *Deleted

## 2020-07-06 ENCOUNTER — Ambulatory Visit (HOSPITAL_COMMUNITY): Payer: BC Managed Care – PPO

## 2020-07-06 ENCOUNTER — Encounter (HOSPITAL_COMMUNITY): Admission: RE | Payer: Self-pay | Source: Home / Self Care

## 2020-07-06 ENCOUNTER — Ambulatory Visit (HOSPITAL_COMMUNITY)
Admission: RE | Admit: 2020-07-06 | Payer: BC Managed Care – PPO | Source: Home / Self Care | Admitting: Cardiovascular Disease

## 2020-07-06 DIAGNOSIS — I4891 Unspecified atrial fibrillation: Secondary | ICD-10-CM | POA: Diagnosis not present

## 2020-07-06 SURGERY — ECHOCARDIOGRAM, TRANSESOPHAGEAL
Anesthesia: General

## 2020-07-06 NOTE — Progress Notes (Signed)
Pt came into the office today for an EKG. Pt identified by name and date of birth. EKG performed by myself. EKG read by Dr. Bjorn Pippin who confirms that pt is sinus brady today and does not need to proceed to the hospital for his planned TEE and cardioversion.  Procedure cancelled per Dr. Bjorn Pippin. Explained to pt. All questions answered. Pt verbalizes understanding.

## 2020-07-23 ENCOUNTER — Other Ambulatory Visit: Payer: Self-pay

## 2020-07-23 ENCOUNTER — Ambulatory Visit (INDEPENDENT_AMBULATORY_CARE_PROVIDER_SITE_OTHER): Payer: BC Managed Care – PPO | Admitting: Family

## 2020-07-23 ENCOUNTER — Encounter (HOSPITAL_BASED_OUTPATIENT_CLINIC_OR_DEPARTMENT_OTHER): Payer: Self-pay | Admitting: Family

## 2020-07-23 VITALS — BP 142/92 | HR 55 | Ht 70.0 in | Wt 225.0 lb

## 2020-07-23 DIAGNOSIS — I480 Paroxysmal atrial fibrillation: Secondary | ICD-10-CM

## 2020-07-23 DIAGNOSIS — I10 Essential (primary) hypertension: Secondary | ICD-10-CM

## 2020-07-23 DIAGNOSIS — Z7901 Long term (current) use of anticoagulants: Secondary | ICD-10-CM

## 2020-07-23 MED ORDER — RIVAROXABAN 20 MG PO TABS: 20.0000 mg | ORAL_TABLET | Freq: Every day | ORAL | 5 refills | Status: DC

## 2020-07-23 MED ORDER — LOSARTAN POTASSIUM 25 MG PO TABS: 25.0000 mg | ORAL_TABLET | Freq: Every day | ORAL | 2 refills | Status: DC

## 2020-07-23 MED ORDER — DILTIAZEM HCL 60 MG PO TABS: 60.0000 mg | ORAL_TABLET | Freq: Four times a day (QID) | ORAL | 2 refills | Status: DC

## 2020-07-23 NOTE — Patient Instructions (Signed)
Medication Instructions:  Your physician has recommended you make the following change in your medication:   CONTINUE Metoprolol 50mg  twice daily (12 hours apart) If your heart rate is more than 100 bpm you can take an extra Metoprolol 50mg  If you do not notice improvement after 1 hour, take Diltiazem 60mg  one tablet If you still have recurrent atrial fibrillation or palpitations, please contact our office  START Losartan 25mg  one tablet daily  *If you need a refill on your cardiac medications before your next appointment, please call your pharmacy*   Lab Work: Your physician recommends that you return for lab work in 2 weeks for BMP, CBC.  If you have labs (blood work) drawn today and your tests are completely normal, you will receive your results only by: MyChart Message (if you have MyChart) OR A paper copy in the mail If you have any lab test that is abnormal or we need to change your treatment, we will call you to review the results.   Testing/Procedures: Your physician has requested that you have an echocardiogram. Echocardiography is a painless test that uses sound waves to create images of your heart. It provides your doctor with information about the size and shape of your heart and how well your heart's chambers and valves are working. This procedure takes approximately one hour. There are no restrictions for this procedure.    Follow-Up: At [facility], you and your health needs are our priority.  As part of our continuing mission to provide you with exceptional heart care, we have created designated Provider Care Teams.  These Care Teams include your primary Cardiologist (physician) and Advanced Practice Providers (APPs -  Physician Assistants and Nurse Practitioners) who all work together to provide you with the care you need, when you need it.  We recommend signing up for the patient portal called "MyChart".  Sign up information is provided on this After Visit Summary.   MyChart is used to connect with patients for Virtual Visits (Telemedicine).  Patients are able to view lab/test results, encounter notes, upcoming appointments, etc.  Non-urgent messages can be sent to your provider as well.   To learn more about what you can do with MyChart, go to .    Your next appointment:   1 month(s)  The format for your next appointment:   In Person  Provider:   Dr. or APP   Other Instructions  Kardia for monitoring of EKGs at home: You can look into the [facility] device by [facility].This device is purchased by you and it connects to an application you download to your smart phone.  It can detect abnormal heart rhythms and alert you to contact your doctor for further evaluation. The device is approximately $90 and the phone application is free.  The web site is:  https://www.[employer]ive[employer].com   Atrial Fibrillation  Atrial fibrillation is a type of heartbeat that is irregular or fast. If you have this condition, your heart beats without any order. This makes it hard foryour heart to pump blood in a normal way. Atrial fibrillation may come and go, or it may become a long-lasting problem. If this condition is not treated, it can put you at higher risk for stroke,heart failure, and other heart problems. What are the causes? This condition may be caused by diseases that damage the heart. They include: High blood pressure. Heart failure. Heart valve disease. Heart surgery. Other causes include: Diabetes. Thyroid disease. Being overweight. Kidney disease. Sometimes the cause is  not known. What increases the risk? You are more likely to develop this condition if: You are older. You smoke. You exercise often and very hard. You have a family history of this condition. You are a man. You use drugs. You drink a lot of alcohol. You have lung conditions, such as emphysema, pneumonia, or COPD. You have sleep apnea. What are the  signs or symptoms? Common symptoms of this condition include: A feeling that your heart is beating very fast. Chest pain or discomfort. Feeling short of breath. Suddenly feeling light-headed or weak. Getting tired easily during activity. Fainting. Sweating. In some cases, there are no symptoms. How is this treated? Treatment for this condition depends on underlying conditions and how you feel when you have atrial fibrillation. They include: Medicines to: Prevent blood clots. Treat heart rate or heart rhythm problems. Using devices, such as a pacemaker, to correct heart rhythm problems. Doing surgery to remove the part of the heart that sends bad signals. Closing an area where clots can form in the heart (left atrial appendage). In some cases, your doctor will treat other underlying conditions. Follow these instructions at home: Medicines Take over-the-counter and prescription medicines only as told by your doctor. Do not take any new medicines without first talking to your doctor. If you are taking blood thinners: Talk with your doctor before you take any medicines that have aspirin or NSAIDs, such as ibuprofen, in them. Take your medicine exactly as told by your doctor. Take it at the same time each day. Avoid activities that could hurt or bruise you. Follow instructions about how to prevent falls. Wear a bracelet that says you are taking blood thinners. Or, carry a card that lists what medicines you take. Lifestyle     Do not use any products that have nicotine or tobacco in them. These include cigarettes, e-cigarettes, and chewing tobacco. If you need help quitting, ask your doctor. Eat heart-healthy foods. Talk with your doctor about the right eating plan for you. Exercise regularly as told by your doctor. Do not drink alcohol. Lose weight if you are overweight. Do not use drugs, including cannabis. General instructions If you have a condition that causes breathing to stop  for a short period of time (apnea), treat it as told by your doctor. Keep a healthy weight. Do not use diet pills unless your doctor says they are safe for you. Diet pills may make heart problems worse. Keep all follow-up visits as told by your doctor. This is important. Contact a doctor if: You notice a change in the speed, rhythm, or strength of your heartbeat. You are taking a blood-thinning medicine and you get more bruising. You get tired more easily when you move or exercise. You have a sudden change in weight. Get help right away if:  You have pain in your chest or your belly (abdomen). You have trouble breathing. You have side effects of blood thinners, such as blood in your vomit, poop (stool), or pee (urine), or bleeding that cannot stop. You have any signs of a stroke. "BE FAST" is an easy way to remember the main warning signs: B - Balance. Signs are dizziness, sudden trouble walking, or loss of balance. E - Eyes. Signs are trouble seeing or a change in how you see. F - Face. Signs are sudden weakness or loss of feeling in the face, or the face or eyelid drooping on one side. A - Arms. Signs are weakness or loss of feeling in an  arm. This happens suddenly and usually on one side of the body. S - Speech. Signs are sudden trouble speaking, slurred speech, or trouble understanding what people say. T - Time. Time to call emergency services. Write down what time symptoms started. You have other signs of a stroke, such as: A sudden, very bad headache with no known cause. Feeling like you may vomit (nausea). Vomiting. A seizure. These symptoms may be an emergency. Do not wait to see if the symptoms will go away. Get medical help right away. Call your local emergency services (911 in the [place] Do not drive yourself to the hospital. Summary Atrial fibrillation is a type of heartbeat that is irregular or fast. You are at higher risk of this condition if you smoke, are older, have  diabetes, or are overweight. Follow your doctor's instructions about medicines, diet, exercise, and follow-up visits. Get help right away if you have signs or symptoms of a stroke. Get help right away if you cannot catch your breath, or you have chest pain or discomfort. This information is not intended to replace advice given to you by your health care provider. Make sure you discuss any questions you have with your healthcare provider. Document Revised: 06/27/2018 Document Reviewed: 06/27/2018 Elsevier Patient Education  2022 [employer].

## 2020-07-23 NOTE — Progress Notes (Signed)
Office Visit    Patient Name: MARY HOCKEY Date of Encounter: 07/23/2020  PCP:  [name]   [facility]  Cardiologist:  Jodelle Red, MD  Advanced Practice Provider:  No care team member to display Electrophysiologist:  None   Chief Complaint    DVON JILES is a 60 y.o. male with a hx of HTN, OA, spinal stenosis s/p surgery, atrial fibrillation presents today for follow up of atrial fibrillation   Past Medical History    Past Medical History:  Diagnosis Date   Arthritis    Chest pain    GERD (gastroesophageal reflux disease)    mild -rolaids if necessary   Hypertension    Insomnia    Spinal stenosis at L4-L5 level    Syncope    Past Surgical History:  Procedure Laterality Date   ANKLE RECONSTRUCTION  left   3 procedures total   CLAVICLE SURGERY     bilateral   KNEE ARTHROSCOPY  2006-most current   left knee X 2 1 ON RIGHT   KNEE ARTHROSCOPY WITH MEDIAL MENISECTOMY  01/27/2012   Procedure: KNEE ARTHROSCOPY WITH MEDIAL MENISECTOMY;  Surgeon: Jacki Cones, MD;  Location: [facility];  Service: Orthopedics;  Laterality: Left;   LUMBAR LAMINECTOMY/DECOMPRESSION MICRODISCECTOMY N/A 03/16/2016   Procedure: CENTRAL DECOMPRESSIVE LUMBAR LAMINECTOMY LUMBAR FOUR TO LUMBAR FIVE;  Surgeon: Ranee Gosselin, MD;  Location: [facility];  Service: Orthopedics;  Laterality: N/A;   TONSILLECTOMY  AS CHILD    Allergies  Allergies  Allergen Reactions   Amlodipine Swelling and Other (See Comments)    Water retention   Sulfa Antibiotics Other (See Comments)    From when younger.   Codeine Itching    History of Present Illness    DARSHAN SOLANKI is a 60 y.o. male with a hx of HTN, OA, spinal stenosis s/p surgery, atrial fibrillation last seen in the ED.  Is evaluated in the emergency department 616/22 after presenting with chest pain and palpitations.  He was diagnosed with new onset atrial  fibrillation with RVR rate 111 bpm.  He was started on Xarelto 20 mg daily.  He was already on metoprolol 50 mg twice daily at the direction of his primary care provider due to elevated blood pressure.  That dose was maintained.  He was scheduled for TEE and cardioversion for by EKG 6 is 20/22 had converted to sinus bradycardia.  He presents today for follow-up with his wife.  Tells me Sunday evening he went out of rhythm and stayed out of rhythm until Tuesday morning.  It is now Thursday morning.  He did take an extra metoprolol Sunday evening when he went into atrial fibrillation but did not notice improvement.  Notes occasional alcohol usage but no recent alcohol intake.  Drinks 2 caffeinated beverages per day.  Tells me his father had atrial fibrillation which was diagnosed in his 52s.  His father passed away shortly thereafter from a stroke and he wonders whether his father took his anticoagulation as prescribed.  Tells me he gets chest discomfort only when he is in atrial fibrillation.  Denies exertional chest discomfort, dyspnea on exertion.  We reviewed atrial fibrillation and treatment modalities in detail.   EKGs/Labs/Other Studies Reviewed:   The following studies were reviewed today:   EKG:  EKG is  ordered today.  The ekg ordered today demonstrates SB 55 bpm with no acute ST/T wave changes.  Recent Labs: 07/02/2020: BUN  16; Creatinine, Ser 0.81; Hemoglobin 15.6; Magnesium 1.9; Platelets 205; Potassium 5.6; Sodium 137  Recent Lipid Panel    Component Value Date/Time   CHOL 179 09/09/2014 1150   TRIG 191.0 (H) 09/09/2014 1150   HDL 40.10 09/09/2014 1150   CHOLHDL 4 09/09/2014 1150   VLDL 38.2 09/09/2014 1150   LDLCALC 100 (H) 09/09/2014 1150   LDLDIRECT 134.8 03/07/2012 1551   Home Medications   Current Meds  Medication Sig   acetaminophen (TYLENOL 8 HOUR) 650 MG CR tablet Take 1 tablet (650 mg total) by mouth every 8 (eight) hours as needed.   clonazePAM (KLONOPIN) 0.5 MG  tablet Take 0.5 mg by mouth at bedtime as needed for sleep.   diltiazem (CARDIZEM) 60 MG tablet Take 1 tablet (60 mg total) by mouth 4 (four) times daily.   loratadine (CLARITIN) 10 MG tablet Take 10 mg by mouth daily.   losartan (COZAAR) 25 MG tablet Take 1 tablet (25 mg total) by mouth daily.   metoprolol tartrate (LOPRESSOR) 50 MG tablet Take 50 mg by mouth 2 (two) times daily.   omeprazole (PRILOSEC) 20 MG capsule Take 20 mg by mouth daily at 6 PM.   [DISCONTINUED] rivaroxaban (XARELTO) 20 MG TABS tablet Take 1 tablet (20 mg total) by mouth daily with supper.     Review of Systems      All other systems reviewed and are otherwise negative except as noted above.  Physical Exam    VS:  BP (!) 142/92   Pulse (!) 55   Ht 5\' 10"  (1.778 m)   Wt 225 lb (102.1 kg)   SpO2 95%   BMI 32.28 kg/m  , BMI Body mass index is 32.28 kg/m.  Wt Readings from Last 3 Encounters:  07/23/20 225 lb (102.1 kg)  05/01/19 215 lb (97.5 kg)  06/20/17 224 lb (101.6 kg)     GEN: Well nourished, well developed, in no acute distress. HEENT: normal. Neck: Supple, no JVD, carotid bruits, or masses. Cardiac: Bradycardic,RRR, no murmurs, rubs, or gallops. No clubbing, cyanosis, edema.  Radials/PT 2+ and equal bilaterally.  Respiratory:  Respirations regular and unlabored, clear to auscultation bilaterally. GI: Soft, nontender, nondistended. MS: No deformity or atrophy. Skin: Warm and dry, no rash. Neuro:  Strength and sensation are intact. Psych: Normal affect.  Assessment & Plan    Atrial fibrillation -new onset 07/02/20.  Self converted and TEE/DCCV was canceled.  Reports recurrent episode of atrial fibrillation from Sunday to Tuesday.  He is symptomatic in atrial fib with weakness, chest pain. Unable to further uptitrate his beta-blocker today due to baseline heart rate 55 bpm.  Will refer to EP for further management of atrial fibrillation as he may require antiarrhythmic or ablation.  Continue  metoprolol tartrate 50 mg twice daily.  If he has recurrent atrial fibrillation he will take an extra 50 mg of metoprolol titrate.  If this does not relieve atrial fibrillation he will take 60 mg diltiazem - will Rx Diltaizem 60mg  PRN for atrial fibrillation.   If this does not relieve atrial fibrillation he is encouraged to contact our office.  Plan to update echocardiogram to rule out valvular abnormalities contributory.  He notes chest pain only with atrial fibrillation which is likely due to demand ischemia in the setting of tachycardia.  Will defer ischemic evaluation at this time.  EKG today shows sinus bradycardia with no acute ST/T wave changes. Continue Xarelto 20mg  daily for CHADS2VASc of 1 given recurrent PAF. CBC in 2  weeks for monitoring. He was encouraged to purchase Kardia device.  HTN -BP not controlled.  Previous intolerance to amlodipine with lower extremity edema.  Continue metoprolol tartrate 50 mg twice daily.  Start losartan 25 mg daily.  Plan for BMP/CBC in 2 weeks.  If BP not at goal can consider increased dose.  Disposition: Follow up in 1 month(s) with Dr. Cristal Deer or APP.  Signed, Alver Sorrow, NP 07/23/2020, 12:49 PM [facility]

## 2020-07-29 ENCOUNTER — Other Ambulatory Visit: Payer: Self-pay

## 2020-07-29 ENCOUNTER — Ambulatory Visit (HOSPITAL_COMMUNITY): Payer: BC Managed Care – PPO | Attending: Family

## 2020-07-29 DIAGNOSIS — I10 Essential (primary) hypertension: Secondary | ICD-10-CM

## 2020-07-29 DIAGNOSIS — I480 Paroxysmal atrial fibrillation: Secondary | ICD-10-CM

## 2020-07-29 LAB — ECHOCARDIOGRAM COMPLETE
Area-P 1/2: 2.61 cm2
S' Lateral: 3.6 cm

## 2020-08-24 ENCOUNTER — Ambulatory Visit (HOSPITAL_BASED_OUTPATIENT_CLINIC_OR_DEPARTMENT_OTHER): Payer: BC Managed Care – PPO | Admitting: Cardiology

## 2020-09-07 ENCOUNTER — Institutional Professional Consult (permissible substitution): Payer: BC Managed Care – PPO | Admitting: Internal Medicine

## 2020-09-07 DIAGNOSIS — I480 Paroxysmal atrial fibrillation: Secondary | ICD-10-CM

## 2020-09-28 ENCOUNTER — Other Ambulatory Visit: Payer: Self-pay

## 2020-09-28 ENCOUNTER — Encounter: Payer: Self-pay | Admitting: Internal Medicine

## 2020-09-28 ENCOUNTER — Ambulatory Visit (INDEPENDENT_AMBULATORY_CARE_PROVIDER_SITE_OTHER): Payer: BC Managed Care – PPO | Admitting: Internal Medicine

## 2020-09-28 VITALS — BP 138/80 | HR 50 | Ht 70.0 in | Wt 224.2 lb

## 2020-09-28 DIAGNOSIS — I10 Essential (primary) hypertension: Secondary | ICD-10-CM

## 2020-09-28 DIAGNOSIS — D6869 Other thrombophilia: Secondary | ICD-10-CM | POA: Diagnosis not present

## 2020-09-28 DIAGNOSIS — I480 Paroxysmal atrial fibrillation: Secondary | ICD-10-CM

## 2020-09-28 MED ORDER — FLECAINIDE ACETATE 50 MG PO TABS: 50.0000 mg | ORAL_TABLET | Freq: Two times a day (BID) | ORAL | 3 refills | Status: DC

## 2020-09-28 MED ORDER — APIXABAN 5 MG PO TABS: 5.0000 mg | ORAL_TABLET | Freq: Two times a day (BID) | ORAL | 3 refills | Status: DC

## 2020-09-28 MED ORDER — METOPROLOL TARTRATE 25 MG PO TABS: 25.0000 mg | ORAL_TABLET | Freq: Two times a day (BID) | ORAL | 3 refills | Status: DC

## 2020-09-28 NOTE — Patient Instructions (Addendum)
Medication Instructions:  Start Flecainide 50 mg two times a day Reduce Metoprolol Tartrate to 25 mg daily Stop Xarelto Start Eliquis 5 mg two times a day Your physician recommends that you continue on your current medications as directed. Please refer to the Current Medication list given to you today.  Labwork: None ordered.  Testing/Procedures: Your physician has recommended that you have a sleep study. This test records several body functions during sleep, including: brain activity, eye movement, oxygen and carbon dioxide blood levels, heart rate and rhythm, breathing rate and rhythm, the flow of air through your mouth and nose, snoring, body muscle movements, and chest and belly movement.   Follow-Up: Your physician wants you to follow-up in: 4 week follow up in the Afib Clinic. They will contact you to schedule.    Any Other Special Instructions Will Be Listed Below (If Applicable).  If you need a refill on your cardiac medications before your next appointment, please call your pharmacy.

## 2020-09-28 NOTE — Progress Notes (Signed)
Electrophysiology Office Note   Date:  09/28/2020   ID:  CHER Justin Young, DOB 1960/06/08, MRN [id number]  PCP:  [name]  Cardiologist:  [name] Primary Electrophysiologist: Justin Range, MD    CC: afib   History of Present Illness: Justin Young is a 60 y.o. male who presents today for electrophysiology evaluation.   He is referred by [name] and Justin Young for EP consultation regarding afib. He was diagnosed with afib 07/02/20 after presenting with palpitations and chest pain to the ER.  He was found to have afib with RVR at that time.  He was started on xarelto. He spontaneously converted to sinus rhythm. He continues to have episodes of irregular palpitations since that time.  His longest episode lasted 18 hours. He is very active, without exertional symptoms when in sinus. He has a mild headache which he attributes to xarelto. + snoring, fatigue, his wife thinks he has sleep apnea. Today, he denies symptoms of exertional chest pain, shortness of breath, orthopnea, PND, lower extremity edema, claudication, dizziness, presyncope, syncope, bleeding, or neurologic sequela. The patient is tolerating medications without difficulties and is otherwise without complaint today.    Past Medical History:  Diagnosis Date   Arthritis    Chest pain    GERD (gastroesophageal reflux disease)    mild -rolaids if necessary   Hypertension    Insomnia    Spinal stenosis at L4-L5 level    Syncope    Past Surgical History:  Procedure Laterality Date   ANKLE RECONSTRUCTION  left   3 procedures total   CLAVICLE SURGERY     bilateral   KNEE ARTHROSCOPY  2006-most current   left knee X 2 1 ON RIGHT   KNEE ARTHROSCOPY WITH MEDIAL MENISECTOMY  01/27/2012   Procedure: KNEE ARTHROSCOPY WITH MEDIAL MENISECTOMY;  Surgeon: Jacki Cones, MD;  Location: [facility];  Service: Orthopedics;  Laterality: Left;   LUMBAR  LAMINECTOMY/DECOMPRESSION MICRODISCECTOMY N/A 03/16/2016   Procedure: CENTRAL DECOMPRESSIVE LUMBAR LAMINECTOMY LUMBAR FOUR TO LUMBAR FIVE;  Surgeon: Ranee Gosselin, MD;  Location: [facility];  Service: Orthopedics;  Laterality: N/A;   TONSILLECTOMY  AS CHILD     Current Outpatient Medications  Medication Sig Dispense Refill   acetaminophen (TYLENOL 8 HOUR) 650 MG CR tablet Take 1 tablet (650 mg total) by mouth every 8 (eight) hours as needed. 30 tablet 0   clonazePAM (KLONOPIN) 0.5 MG tablet Take 0.5 mg by mouth at bedtime as needed for sleep.     diltiazem (CARDIZEM) 60 MG tablet Take 1 tablet (60 mg total) by mouth 4 (four) times daily. 30 tablet 2   loratadine (CLARITIN) 10 MG tablet Take 10 mg by mouth daily.     losartan (COZAAR) 25 MG tablet Take 1 tablet (25 mg total) by mouth daily. 30 tablet 2   metoprolol tartrate (LOPRESSOR) 50 MG tablet Take 50 mg by mouth 2 (two) times daily.     omeprazole (PRILOSEC) 20 MG capsule Take 20 mg by mouth daily at 6 PM.     ondansetron (ZOFRAN ODT) 8 MG disintegrating tablet Take 1 tablet (8 mg total) by mouth every 8 (eight) hours as needed for nausea. 20 tablet 0   rivaroxaban (XARELTO) 20 MG TABS tablet Take 1 tablet (20 mg total) by mouth daily with supper. 30 tablet 5   No current facility-administered medications for this visit.    Allergies:   Amlodipine, Sulfa antibiotics, and Codeine  Social History:  The patient  reports that he quit smoking about 13 years ago. His smoking use included cigarettes. He has a 90.00 pack-year smoking history. He has never used smokeless tobacco. He reports current alcohol use of about 7.0 standard drinks per week. He reports that he does not use drugs.   Family History:  The patient's family history includes Alcohol abuse in his father; Arthritis in his maternal grandmother and mother; Cancer in his mother; Colon cancer in his mother; Diabetes in his mother; Heart disease in his father and sister; Hyperlipidemia  in his father; Hypertension in his father and sister; Sleep apnea in his sister.    ROS:  Please see the history of present illness.   All other systems are personally reviewed and negative.    PHYSICAL EXAM: VS:  BP 138/80   Pulse (!) 50   Ht 5\' 10"  (1.778 m)   Wt 224 lb 3.2 oz (101.7 kg)   SpO2 95%   BMI 32.17 kg/m  , BMI Body mass index is 32.17 kg/m. GEN: Well nourished, well developed, in no acute distress HEENT: normal Neck: no JVD, carotid bruits, or masses Cardiac: RRR; no murmurs, rubs, or gallops,no edema  Respiratory:  clear to auscultation bilaterally, normal work of breathing GI: soft, nontender, nondistended, + BS MS: no deformity or atrophy Skin: warm and dry  Neuro:  Strength and sensation are intact Psych: euthymic mood, full affect  EKG:  EKG is ordered today. The ekg ordered today is personally reviewed and shows sinus bradycardia 50 bpm  Ecg 07/02/20- afib  Echo 07/29/20- EF 55%, mild LVH,   Recent Labs: 07/02/2020: BUN 16; Creatinine, Ser 0.81; Hemoglobin 15.6; Magnesium 1.9; Platelets 205; Potassium 5.6; Sodium 137  personally reviewed   Lipid Panel     Component Value Date/Time   CHOL 179 09/09/2014 1150   TRIG 191.0 (H) 09/09/2014 1150   HDL 40.10 09/09/2014 1150   CHOLHDL 4 09/09/2014 1150   VLDL 38.2 09/09/2014 1150   LDLCALC 100 (H) 09/09/2014 1150   LDLDIRECT 134.8 03/07/2012 1551   personally reviewed   Wt Readings from Last 3 Encounters:  09/28/20 224 lb 3.2 oz (101.7 kg)  07/23/20 225 lb (102.1 kg)  05/01/19 215 lb (97.5 kg)      Other studies personally reviewed: Additional studies/ records that were reviewed today include: Cardiology notes, prior, ecgs, echo  Review of the above records today demonstrates: as above   ASSESSMENT AND PLAN:  1.  Paroxysmal atrial fibrillation The patient has symptomatic, recurrent  atrial fibrillation.  Chads2vasc score is 1.  he is anticoagulated with xarelto.  Due to headaches, I will  switch from xarelto to eliquis at this time. Given low chads2vasc score, we may be  Therapeutic strategies for afib including medicine (flecainide) and ablation were discussed in detail with the patient today. Risk, benefits, and alternatives to flecainide as well as EP study and radiofrequency ablation for afib were also discussed in detail today.   At this time, he would prefer to try flecainide. I will therefore start flecainide 50mg  BID at this time. Switch xarelto to eliquis as above due to headaches. Reduce metoprolol to 25mg  BID given fatigue. Could consider switching metoprolol to long acting diltiazem on return. Follow-up in AF clinic in 4 weeks  2. Fatigue, snoring Sleep study is ordered  3. HTN Reduce metoprolol as above Could titrate losartan if needed  Risks, benefits and potential toxicities for medications prescribed and/or refilled reviewed with patient today.  Follow-up:  afib clinic in 4 weeks  Signed, Justin Range, MD  09/28/2020 8:45 AM     [facility] [address] [place] [address] [phone] (office) [phone] (fax)

## 2020-09-29 ENCOUNTER — Telehealth: Payer: BC Managed Care – PPO | Admitting: *Deleted

## 2020-09-29 DIAGNOSIS — R5383 Other fatigue: Secondary | ICD-10-CM

## 2020-09-29 DIAGNOSIS — R0683 Snoring: Secondary | ICD-10-CM

## 2020-09-29 NOTE — Telephone Encounter (Signed)
Prior Authorization for Automatic Data sent to [facility] via web portal. Tracking Number [id number].

## 2020-09-29 NOTE — Telephone Encounter (Signed)
-----   Message from Lattie Haw, RN sent at 09/28/2020 12:37 PM EDT ----- Regarding: [facility] Sleep Study Patient needs [facility] Sleep Study per Dr. Johney Frame. Please pre-cert. Thanks

## 2020-10-02 NOTE — Telephone Encounter (Signed)
Received denial from [facility] for Justin Young. Denied because patient has already been diagnosed with OSA in 2019. Ordering MD will be notified. He can call and do a peer to peer by calling [phone].

## 2020-10-07 NOTE — Telephone Encounter (Signed)
As he has known OSA, we will refer to sleep specialist to determine best next test.  Ok to refer to Dr Mayford Knife if he would like or for him to address through PCP.

## 2020-10-08 NOTE — Telephone Encounter (Signed)
Spoke with sleep pool team because patient wanted to know what his options are for paying for the study. Patient also confused about the OSA diagnosis stating he has never had a sleep study. Team advised referral to new MD would need a sleep study completed first prior to referral processing. Patient agreeable to in person sleep study if insurance will approve. Placed order and sent to sleep pool for approval.

## 2020-10-09 ENCOUNTER — Telehealth: Payer: Self-pay | Admitting: Physician Assistant

## 2020-10-09 NOTE — Telephone Encounter (Signed)
Returned call to patient. He is COVID positive and having breathing problems. He has a "cold windpipe" fever and headache. He has associated cough. I have advised seeing an Urgent Care if he is having breathing problems. He states he is not having breathing problems but his windpipe is cold. He is currently driving through [facility]. I have advised mucinex and tylenol along with OP vitamin regimen. He would like Dr. Johney Frame to be aware. I advised again ER/UC if breathing issues. I also advised calling his PCP for further management. He states that he would like to hear from Dr. Johney Frame. I advised I would send a message.   Roe Rutherford Krystal Delduca, PA-C 10/09/2020, 5:29 PM [phone]

## 2020-10-12 NOTE — Telephone Encounter (Signed)
Prior Authorization for SPLIT NIGHT sent to [facility] via web portal. Tracking Number [id number]. Valid dates 10/12/20-12/10/20. Please schedule patient.

## 2020-10-17 ENCOUNTER — Other Ambulatory Visit (HOSPITAL_BASED_OUTPATIENT_CLINIC_OR_DEPARTMENT_OTHER): Payer: Self-pay | Admitting: Family

## 2020-10-17 DIAGNOSIS — I10 Essential (primary) hypertension: Secondary | ICD-10-CM

## 2020-10-27 ENCOUNTER — Telehealth (HOSPITAL_COMMUNITY): Payer: Self-pay | Admitting: *Deleted

## 2020-10-27 MED ORDER — METOPROLOL TARTRATE 25 MG PO TABS: 50.0000 mg | ORAL_TABLET | Freq: Two times a day (BID) | ORAL | 3 refills | Status: DC

## 2020-10-27 NOTE — Telephone Encounter (Signed)
Pt called in stating he continues having frequent breakthrough afib with the addition of flecainide. This weekend he had several episodes of afib that seemed "different" when he went into afib he felt a "shock" in his chest on Saturday. The episodes on Sunday he felt "under oxygenated" he wasn't labored in breathing but he could feel his oxygen was not right and his HR was 97.  Discussed with Jorja Loa PA will increase metoprolol back to 50mg  BID and see if this reduces afib burden until seen next week as pt is currently in .

## 2020-10-28 NOTE — Telephone Encounter (Signed)
Patient is scheduled for lab study on 11/30/20. Patient understands his sleep study will be done at [facility] sleep lab. Patient understands he will receive a sleep packet in a week or so. Patient understands to call if he does not receive the sleep packet in a timely manner. Left detailed message on voicemail with date and time of titration and informed patient to call back to confirm or reschedule.

## 2020-10-29 ENCOUNTER — Ambulatory Visit (HOSPITAL_COMMUNITY): Payer: BC Managed Care – PPO | Admitting: Nurse Practitioner

## 2020-11-02 ENCOUNTER — Ambulatory Visit (HOSPITAL_COMMUNITY)
Admission: RE | Admit: 2020-11-02 | Discharge: 2020-11-02 | Disposition: A | Discharge status: Home / Self Care | Payer: BC Managed Care – PPO | Source: Ambulatory Visit | Attending: Nurse Practitioner | Admitting: Nurse Practitioner

## 2020-11-02 ENCOUNTER — Other Ambulatory Visit: Payer: Self-pay

## 2020-11-02 ENCOUNTER — Encounter (HOSPITAL_COMMUNITY): Payer: Self-pay | Admitting: Physician Assistant

## 2020-11-02 VITALS — BP 150/90 | HR 54 | Ht 70.0 in | Wt 223.4 lb

## 2020-11-02 DIAGNOSIS — I480 Paroxysmal atrial fibrillation: Secondary | ICD-10-CM | POA: Diagnosis not present

## 2020-11-02 DIAGNOSIS — Z6832 Body mass index (BMI) 32.0-32.9, adult: Secondary | ICD-10-CM | POA: Diagnosis not present

## 2020-11-02 DIAGNOSIS — Z888 Allergy status to other drugs, medicaments and biological substances status: Secondary | ICD-10-CM | POA: Insufficient documentation

## 2020-11-02 DIAGNOSIS — Z882 Allergy status to sulfonamides status: Secondary | ICD-10-CM | POA: Insufficient documentation

## 2020-11-02 DIAGNOSIS — E669 Obesity, unspecified: Secondary | ICD-10-CM | POA: Insufficient documentation

## 2020-11-02 DIAGNOSIS — Z8249 Family history of ischemic heart disease and other diseases of the circulatory system: Secondary | ICD-10-CM | POA: Diagnosis not present

## 2020-11-02 DIAGNOSIS — Z87891 Personal history of nicotine dependence: Secondary | ICD-10-CM | POA: Insufficient documentation

## 2020-11-02 DIAGNOSIS — Z7901 Long term (current) use of anticoagulants: Secondary | ICD-10-CM | POA: Diagnosis not present

## 2020-11-02 DIAGNOSIS — Z79899 Other long term (current) drug therapy: Secondary | ICD-10-CM | POA: Insufficient documentation

## 2020-11-02 DIAGNOSIS — R0683 Snoring: Secondary | ICD-10-CM | POA: Insufficient documentation

## 2020-11-02 DIAGNOSIS — Z885 Allergy status to narcotic agent status: Secondary | ICD-10-CM | POA: Diagnosis not present

## 2020-11-02 DIAGNOSIS — I10 Essential (primary) hypertension: Secondary | ICD-10-CM | POA: Diagnosis not present

## 2020-11-02 MED ORDER — FLECAINIDE ACETATE 50 MG PO TABS: 100.0000 mg | ORAL_TABLET | Freq: Two times a day (BID) | ORAL | 3 refills | Status: DC

## 2020-11-02 NOTE — Patient Instructions (Signed)
Increase Flecainide 100mg twice daily  

## 2020-11-02 NOTE — Progress Notes (Signed)
Primary Care Physician: [name] Cardiologist: [name]  Primary Electrophysiologist: Dr Johney Frame Referring Physician: Dr Johney Frame   Justin Young is a 60 y.o. male with a history of HTN and atrial fibrillation who presents for follow up in the [facility]. The patient was initially diagnosed with atrial fibrillation 07/02/20 after presenting to the ED with symptoms of chest pain and palpitations. He spontaneously converted to SR. Patient was started on Xarelto for a CHADS2VASC score of 1. This was changed to Eliquis due to side effects. He was seen by Dr Johney Frame on 09/28/20 and started on flecainide.   On follow up today, patient reports that he has continued to have almost daily symptoms of tachypalpitations. He denies any bleeding issues on anticoagulation. He is scheduled for a sleep study next month.   Today, he denies symptoms of shortness of breath, orthopnea, PND, lower extremity edema, dizziness, presyncope, syncope, bleeding, or neurologic sequela. The patient is tolerating medications without difficulties and is otherwise without complaint today.    Atrial Fibrillation Risk Factors:  he does have symptoms or diagnosis of sleep apnea. he does not have a history of rheumatic fever. he does not have a history of alcohol use. The patient does have a history of early familial atrial fibrillation or other arrhythmias. Father had afib.   he has a BMI of Body mass index is 32.05 kg/m.Marland Kitchen Filed Weights   11/02/20 1154  Weight: 101.3 kg    Family History  Problem Relation Age of Onset   Arthritis Mother    Cancer Mother        Ovarian cancer primary,colon cancer.   Diabetes Mother    Colon cancer Mother    Alcohol abuse Father    Hyperlipidemia Father    Hypertension Father    Heart disease Father    Heart disease Sister    Hypertension Sister    Sleep apnea Sister    Arthritis Maternal Grandmother       Atrial Fibrillation Management history:  Previous antiarrhythmic drugs: flecainide  Previous cardioversions: none Previous ablations: none CHADS2VASC score: 1 Anticoagulation history: Xarelto, Eliquis   Past Medical History:  Diagnosis Date   Arthritis    GERD (gastroesophageal reflux disease)    mild -rolaids if necessary   Hypertension    Insomnia    Paroxysmal atrial fibrillation (HCC)    Spinal stenosis at L4-L5 level    Syncope 2020   Past Surgical History:  Procedure Laterality Date   ANKLE RECONSTRUCTION  left   3 procedures total   CLAVICLE SURGERY     bilateral   KNEE ARTHROSCOPY  2006-most current   left knee X 2 1 ON RIGHT   KNEE ARTHROSCOPY WITH MEDIAL MENISECTOMY  01/27/2012   Procedure: KNEE ARTHROSCOPY WITH MEDIAL MENISECTOMY;  Surgeon: Jacki Cones, MD;  Location: [facility];  Service: Orthopedics;  Laterality: Left;   LUMBAR LAMINECTOMY/DECOMPRESSION MICRODISCECTOMY N/A 03/16/2016   Procedure: CENTRAL DECOMPRESSIVE LUMBAR LAMINECTOMY LUMBAR FOUR TO LUMBAR FIVE;  Surgeon: Ranee Gosselin, MD;  Location: [facility];  Service: Orthopedics;  Laterality: N/A;   TONSILLECTOMY  AS CHILD    Current Outpatient Medications  Medication Sig Dispense Refill   acetaminophen (TYLENOL 8 HOUR) 650 MG CR tablet Take 1 tablet (650 mg total) by mouth every 8 (eight) hours as needed. 30 tablet 0   apixaban (ELIQUIS) 5 MG TABS tablet Take 1 tablet (5 mg total) by mouth 2 (two) times  daily. 180 tablet 3   clonazePAM (KLONOPIN) 0.5 MG tablet Take 0.5 mg by mouth at bedtime as needed for sleep.     diltiazem (CARDIZEM) 60 MG tablet Take 1 tablet (60 mg total) by mouth 4 (four) times daily. 30 tablet 2   flecainide (TAMBOCOR) 50 MG tablet Take 1 tablet (50 mg total) by mouth 2 (two) times daily. 180 tablet 3   loratadine (CLARITIN) 10 MG tablet Take 10 mg by mouth daily.     losartan (COZAAR) 25 MG tablet TAKE 1 TABLET (25 MG TOTAL) BY MOUTH DAILY. 90 tablet 3    metoprolol tartrate (LOPRESSOR) 25 MG tablet Take 2 tablets (50 mg total) by mouth 2 (two) times daily. (Patient taking differently: Take 25 mg by mouth 2 (two) times daily.) 180 tablet 3   omeprazole (PRILOSEC) 20 MG capsule Take 20 mg by mouth daily at 6 PM.     ondansetron (ZOFRAN ODT) 8 MG disintegrating tablet Take 1 tablet (8 mg total) by mouth every 8 (eight) hours as needed for nausea. 20 tablet 0   No current facility-administered medications for this encounter.    Allergies  Allergen Reactions   Amlodipine Swelling and Other (See Comments)    Water retention   Sulfa Antibiotics Other (See Comments)    From when younger.   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  Tobacco Use   Smoking status: Former    Packs/day: 3.00    Years: 30.00    Pack years: 90.00    Types: Cigarettes    Quit date: 01/25/2007    Years since quitting: 13.7   Smokeless tobacco: Never   Tobacco comments:    Former smoker 11/02/2020  Vaping Use   Vaping Use: Never used  Substance and Sexual Activity   Alcohol use: Yes    Alcohol/week: 7.0 standard drinks    Types: 7 Shots of liquor per week    Comment: couple vodka drinks per day   Drug use: No   Sexual activity: Yes    Partners: Female  Other Topics Concern   Not on file  Social History Narrative   HSG, [employer] - BS [place], [employer] -misc business classes.Married '84 wife Gavin Pound: 3 kids - g b g: '86, '88, '95. 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  Physical Exam: Vitals:   11/02/20 1154  BP: (!) 150/90  Pulse: (!) 54  Weight: 101.3 kg  Height: 5\' 10"  (1.778 m)    GEN- The patient is a well appearing obese male, alert and oriented x 3 today.   Head- normocephalic, atraumatic Eyes-  Sclera clear, conjunctiva pink Ears- hearing intact Oropharynx- clear Neck-  supple  Lungs- Clear to ausculation bilaterally, normal work of breathing Heart- Regular rate and rhythm, bradycardia, no murmurs, rubs or gallops  GI- soft, NT, ND, + BS Extremities- no clubbing, cyanosis, or edema MS- no significant deformity or atrophy Skin- no rash or lesion Psych- euthymic mood, full affect Neuro- strength and sensation are intact  Wt Readings from Last 3 Encounters:  11/02/20 101.3 kg  09/28/20 101.7 kg  07/23/20 102.1 kg    EKG today demonstrates  SR Vent. rate 54 BPM PR interval 166 ms QRS duration 114 ms QT/QTcB 428/405 ms  Echo 07/29/20 demonstrated   1. Left ventricular ejection fraction, by estimation, is 55 to 60%. Left  ventricular ejection fraction by 3D volume is 59 %. The left ventricle has normal function. The left ventricle has no regional wall motion  abnormalities. There is mild asymmetric left ventricular hypertrophy of the basal-septal segment. Left ventricular diastolic parameters are consistent with Grade I diastolic dysfunction (impaired relaxation). The average left ventricular global longitudinal strain is -24.6 %. The global longitudinal strain is normal.   2. Right ventricular systolic function is normal. The right ventricular  size is normal. Tricuspid regurgitation signal is inadequate for assessing PA pressure.   3. The mitral valve is normal in  structure. Trivial mitral valve  regurgitation. No evidence of mitral stenosis.   4. The aortic valve is tricuspid. There is mild calcification of the  aortic valve. There is mild thickening of the aortic valve. Aortic valve  regurgitation is not visualized. Mild aortic valve sclerosis is present,  with no evidence of aortic valve  stenosis.   5. Aortic dilatation noted. There is mild dilatation of the aortic root,  measuring 39 mm. There is mild dilatation of the ascending aorta,  measuring 39 mm.   6. The inferior vena cava is normal in size with greater than 50%  respiratory variability, suggesting right atrial pressure of 3 mmHg.   Comparison(s): No significant change from prior study. 06/05/17 EF 55-60%.   Epic records are reviewed at length today  CHA2DS2-VASc Score = 1  The patient's score is based upon: CHF History: 0 HTN History: 1 Diabetes History: 0 Stroke History: 0 Vascular Disease History: 0 Age Score: 0 Gender Score: 0       ASSESSMENT AND PLAN: 1. Paroxysmal Atrial Fibrillation (ICD10:  I48.0) The patient's CHA2DS2-VASc score is 1, indicating a 0.6% annual risk of stroke.   Patient continues to have frequent symptomatic episodes of afib. Will increased flecainide to 100 mg BID.  Continue Lopressor 50 mg BID Continue Eliquis 5 mg BID  2. Obesity Body mass index is 32.05 kg/m. Lifestyle modification was discussed at length including regular exercise and weight reduction.  3. Snoring/suspected obstructive sleep apnea Referred for sleep study per Dr Johney Frame.  4. HTN Stable, no changes today.   Follow up in the AF clinic in one week.    Jorja Loa PA-C Afib Clinic [facility] [address] [place], [place] [address] [phone] 11/02/2020 12:01 PM

## 2020-11-09 ENCOUNTER — Other Ambulatory Visit: Payer: Self-pay

## 2020-11-09 ENCOUNTER — Ambulatory Visit (HOSPITAL_COMMUNITY)
Admission: RE | Admit: 2020-11-09 | Discharge: 2020-11-09 | Disposition: A | Discharge status: Home / Self Care | Payer: BC Managed Care – PPO | Source: Ambulatory Visit | Attending: Physician Assistant | Admitting: Physician Assistant

## 2020-11-09 VITALS — BP 146/86 | HR 52 | Ht 70.0 in | Wt 224.4 lb

## 2020-11-09 DIAGNOSIS — Z8249 Family history of ischemic heart disease and other diseases of the circulatory system: Secondary | ICD-10-CM | POA: Insufficient documentation

## 2020-11-09 DIAGNOSIS — I480 Paroxysmal atrial fibrillation: Secondary | ICD-10-CM | POA: Insufficient documentation

## 2020-11-09 DIAGNOSIS — R5383 Other fatigue: Secondary | ICD-10-CM | POA: Insufficient documentation

## 2020-11-09 DIAGNOSIS — Z7901 Long term (current) use of anticoagulants: Secondary | ICD-10-CM | POA: Diagnosis not present

## 2020-11-09 DIAGNOSIS — R0683 Snoring: Secondary | ICD-10-CM | POA: Insufficient documentation

## 2020-11-09 DIAGNOSIS — E669 Obesity, unspecified: Secondary | ICD-10-CM | POA: Diagnosis not present

## 2020-11-09 DIAGNOSIS — Z79899 Other long term (current) drug therapy: Secondary | ICD-10-CM | POA: Diagnosis not present

## 2020-11-09 DIAGNOSIS — Z09 Encounter for follow-up examination after completed treatment for conditions other than malignant neoplasm: Secondary | ICD-10-CM | POA: Insufficient documentation

## 2020-11-09 DIAGNOSIS — I10 Essential (primary) hypertension: Secondary | ICD-10-CM | POA: Insufficient documentation

## 2020-11-09 DIAGNOSIS — Z87891 Personal history of nicotine dependence: Secondary | ICD-10-CM | POA: Diagnosis not present

## 2020-11-09 DIAGNOSIS — Z6832 Body mass index (BMI) 32.0-32.9, adult: Secondary | ICD-10-CM | POA: Insufficient documentation

## 2020-11-09 MED ORDER — FLECAINIDE ACETATE 100 MG PO TABS: 100.0000 mg | ORAL_TABLET | Freq: Two times a day (BID) | ORAL | 3 refills | Status: DC

## 2020-11-09 MED ORDER — METOPROLOL TARTRATE 25 MG PO TABS: 12.5000 mg | ORAL_TABLET | Freq: Two times a day (BID) | ORAL | Status: DC

## 2020-11-09 MED ORDER — METOPROLOL TARTRATE 25 MG PO TABS: 25.0000 mg | ORAL_TABLET | Freq: Two times a day (BID) | ORAL | Status: DC

## 2020-11-09 NOTE — Progress Notes (Signed)
Primary Care Physician: [name] Cardiologist: [name]  Primary Electrophysiologist: Dr Johney Frame Referring Physician: Dr Johney Frame   Justin Young is a 60 y.o. male with a history of HTN and atrial fibrillation who presents for follow up in the [facility]. The patient was initially diagnosed with atrial fibrillation 07/02/20 after presenting to the ED with symptoms of chest pain and palpitations. He spontaneously converted to SR. Patient was started on Xarelto for a CHADS2VASC score of 1. This was changed to Eliquis due to side effects. He was seen by Dr Johney Frame on 09/28/20 and started on flecainide.   On follow up today, patient reports that he has felt much better this past week on the higher dose of flecainide. He did have two episodes of palpitations but these were very brief. He does have some peripheral vision blurring but this is mild and not bothersome for him. He does have fatigue with exertion.   Today, he denies symptoms of shortness of breath, orthopnea, PND, lower extremity edema, dizziness, presyncope, syncope, bleeding, or neurologic sequela. The patient is tolerating medications without difficulties and is otherwise without complaint today.    Atrial Fibrillation Risk Factors:  he does have symptoms or diagnosis of sleep apnea. he does not have a history of rheumatic fever. he does not have a history of alcohol use. The patient does have a history of early familial atrial fibrillation or other arrhythmias. Father had afib.   he has a BMI of Body mass index is 32.2 kg/m.Marland Kitchen Filed Weights   11/09/20 1037  Weight: 101.8 kg     Family History  Problem Relation Age of Onset   Arthritis Mother    Cancer Mother        Ovarian cancer primary,colon cancer.   Diabetes Mother    Colon cancer Mother    Alcohol abuse Father    Hyperlipidemia Father    Hypertension Father    Heart disease Father     Heart disease Sister    Hypertension Sister    Sleep apnea Sister    Arthritis Maternal Grandmother      Atrial Fibrillation Management history:  Previous antiarrhythmic drugs: flecainide  Previous cardioversions: none Previous ablations: none CHADS2VASC score: 1 Anticoagulation history: Xarelto, Eliquis   Past Medical History:  Diagnosis Date   Arthritis    GERD (gastroesophageal reflux disease)    mild -rolaids if necessary   Hypertension    Insomnia    Paroxysmal atrial fibrillation (HCC)    Spinal stenosis at L4-L5 level    Syncope 2020   Past Surgical History:  Procedure Laterality Date   ANKLE RECONSTRUCTION  left   3 procedures total   CLAVICLE SURGERY     bilateral   KNEE ARTHROSCOPY  2006-most current   left knee X 2 1 ON RIGHT   KNEE ARTHROSCOPY WITH MEDIAL MENISECTOMY  01/27/2012   Procedure: KNEE ARTHROSCOPY WITH MEDIAL MENISECTOMY;  Surgeon: Jacki Cones, MD;  Location: [facility];  Service: Orthopedics;  Laterality: Left;   LUMBAR LAMINECTOMY/DECOMPRESSION MICRODISCECTOMY N/A 03/16/2016   Procedure: CENTRAL DECOMPRESSIVE LUMBAR LAMINECTOMY LUMBAR FOUR TO LUMBAR FIVE;  Surgeon: Ranee Gosselin, MD;  Location: [facility];  Service: Orthopedics;  Laterality: N/A;   TONSILLECTOMY  AS CHILD    Current Outpatient Medications  Medication Sig Dispense Refill   acetaminophen (TYLENOL 8 HOUR) 650 MG CR tablet Take 1 tablet (650 mg total) by mouth every 8 (eight) hours as  needed. 30 tablet 0   apixaban (ELIQUIS) 5 MG TABS tablet Take 1 tablet (5 mg total) by mouth 2 (two) times daily. 180 tablet 3   clonazePAM (KLONOPIN) 0.5 MG tablet Take 0.5 mg by mouth at bedtime as needed for sleep.     diltiazem (CARDIZEM) 60 MG tablet Take 1 tablet (60 mg total) by mouth 4 (four) times daily. (Patient taking differently: Take 60 mg by mouth 4 (four) times daily. Only for A-fib episodes) 30 tablet 2   flecainide (TAMBOCOR) 50 MG tablet Take 2 tablets (100 mg total)  by mouth 2 (two) times daily. 180 tablet 3   loratadine (CLARITIN) 10 MG tablet Take 10 mg by mouth daily.     losartan (COZAAR) 25 MG tablet TAKE 1 TABLET (25 MG TOTAL) BY MOUTH DAILY. 90 tablet 3   omeprazole (PRILOSEC) 20 MG capsule Take 20 mg by mouth daily at 6 PM.     ondansetron (ZOFRAN ODT) 8 MG disintegrating tablet Take 1 tablet (8 mg total) by mouth every 8 (eight) hours as needed for nausea. 20 tablet 0   metoprolol tartrate (LOPRESSOR) 25 MG tablet Take 1 tablet (25 mg total) by mouth 2 (two) times daily.     No current facility-administered medications for this encounter.    Allergies  Allergen Reactions   Amlodipine Swelling and Other (See Comments)    Water retention   Sulfa Antibiotics Other (See Comments)    From when younger.   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  Tobacco Use   Smoking status: Former    Packs/day: 3.00    Years: 30.00    Pack years: 90.00    Types: Cigarettes    Quit date: 01/25/2007    Years since quitting: 13.8   Smokeless tobacco: Never   Tobacco comments:    Former smoker 11/02/2020  Vaping Use   Vaping Use: Never used  Substance and Sexual Activity   Alcohol use: Yes    Alcohol/week: 7.0 standard drinks    Types: 7 Shots of liquor per week    Comment: couple vodka drinks per day   Drug use: No   Sexual activity: Yes    Partners: Female  Other Topics Concern   Not on file  Social History Narrative   HSG, [employer] - BS [place], [employer] -misc business classes.Married '84 wife Justin Young: 3 kids - g b g: '86, '88, '95. 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  Physical Exam: Vitals:   11/09/20 1037  BP: (!) 146/86  Pulse: (!) 52  Weight: 101.8 kg  Height: 5\' 10"  (1.778 m)    GEN- The patient is a well  appearing obese male, alert and oriented x 3 today.   HEENT-head normocephalic, atraumatic, sclera clear, conjunctiva pink, hearing intact, trachea midline. Lungs- Clear to ausculation bilaterally, normal work of breathing Heart- Regular rate and rhythm, bradycardia, no murmurs, rubs or gallops  GI- soft, NT, ND, + BS Extremities- no clubbing, cyanosis, or edema MS- no significant deformity or atrophy Skin- no rash or lesion Psych- euthymic mood, full affect Neuro- strength and sensation are intact   Wt Readings from Last 3 Encounters:  11/09/20 101.8 kg  11/02/20 101.3 kg  09/28/20 101.7 kg    EKG today demonstrates  SB, RBBB Vent. rate 52 BPM PR interval 178 ms QRS duration 124 ms QT/QTcB 428/398 ms  Echo 07/29/20 demonstrated   1. Left ventricular ejection fraction, by estimation, is 55 to 60%. Left  ventricular ejection fraction by 3D volume is 59 %. The left ventricle has normal function. The left ventricle has no regional wall motion  abnormalities. There is mild asymmetric left ventricular hypertrophy of the basal-septal segment. Left ventricular diastolic parameters are consistent with Grade I diastolic dysfunction (impaired relaxation). The average left ventricular global longitudinal strain is -24.6 %. The global longitudinal strain is normal.   2. Right ventricular systolic function is normal. The right ventricular  size is normal.  Tricuspid regurgitation signal is inadequate for assessing PA pressure.   3. The mitral valve is normal in structure. Trivial mitral valve  regurgitation. No evidence of mitral stenosis.   4. The aortic valve is tricuspid. There is mild calcification of the  aortic valve. There is mild thickening of the aortic valve. Aortic valve  regurgitation is not visualized. Mild aortic valve sclerosis is present,  with no evidence of aortic valve  stenosis.   5. Aortic dilatation noted. There is mild dilatation of the aortic root,  measuring 39 mm. There is mild dilatation of the ascending aorta,  measuring 39 mm.   6. The inferior vena cava is normal in size with greater than 50%  respiratory variability, suggesting right atrial pressure of 3 mmHg.   Comparison(s): No significant change from prior study. 06/05/17 EF 55-60%.   Epic records are reviewed at length today  CHA2DS2-VASc Score = 1  The patient's score is based upon: CHF History: 0 HTN History: 1 Diabetes History: 0 Stroke History: 0 Vascular Disease History: 0 Age Score: 0 Gender Score: 0       ASSESSMENT AND PLAN: 1. Paroxysmal Atrial Fibrillation (ICD10:  I48.0) The patient's CHA2DS2-VASc score is 1, indicating a 0.6% annual risk of stroke.   Patient feel better on higher dose flecainide with less tachypalpitations.  Continue flecainide 100 mg BID  Decrease Lopressor to 12.5 mg BID given bradycardia and fatigue.  Continue Eliquis 5 mg BID  2. Obesity Body mass index is 32.2 kg/m. Lifestyle modification was discussed and encouraged including regular physical activity and weight reduction.  3. Snoring/suspected obstructive sleep apnea Sleep study scheduled for 11/30/20  4. HTN Stable, med changes as above.    Follow up in the AF clinic in 3 months.    Jorja Loa PA-C Afib Clinic [facility] [address] [place], [place] [address] [phone] 11/09/2020 10:57 AM

## 2020-11-09 NOTE — Patient Instructions (Signed)
Decrease metoprolol 12.5mg  twice a day

## 2020-11-30 ENCOUNTER — Ambulatory Visit (HOSPITAL_BASED_OUTPATIENT_CLINIC_OR_DEPARTMENT_OTHER): Payer: BC Managed Care – PPO | Attending: Internal Medicine | Admitting: Cardiology

## 2020-11-30 ENCOUNTER — Other Ambulatory Visit: Payer: Self-pay

## 2020-11-30 DIAGNOSIS — G4733 Obstructive sleep apnea (adult) (pediatric): Secondary | ICD-10-CM | POA: Insufficient documentation

## 2020-11-30 DIAGNOSIS — R5383 Other fatigue: Secondary | ICD-10-CM | POA: Diagnosis present

## 2020-11-30 DIAGNOSIS — G4736 Sleep related hypoventilation in conditions classified elsewhere: Secondary | ICD-10-CM | POA: Diagnosis not present

## 2020-11-30 DIAGNOSIS — R0683 Snoring: Secondary | ICD-10-CM | POA: Diagnosis present

## 2020-11-30 DIAGNOSIS — G4737 Central sleep apnea in conditions classified elsewhere: Secondary | ICD-10-CM | POA: Insufficient documentation

## 2020-11-30 DIAGNOSIS — Z79899 Other long term (current) drug therapy: Secondary | ICD-10-CM | POA: Diagnosis not present

## 2020-12-14 NOTE — Procedures (Signed)
   Patient Name: Justin Young, Justin Young Date: 11/30/2020 Gender: Male D.O.B: [birth year] Age (years): 7 Referring Provider: [name] Height (inches): 70 Interpreting Physician: Armanda Magic MD, ABSM Weight (lbs): 215 RPSGT: Shelah Lewandowsky BMI: 31 MRN: [id number] Neck Size: 16.25  CLINICAL INFORMATION Sleep Study Type: NPSG  Indication for sleep study: Fatigue, Obesity  Epworth Sleepiness Score: 14  Most recent polysomnogram dated 03/07/2017 revealed an AHI of 66.4/h.  SLEEP STUDY TECHNIQUE As per the AASM Manual for the Scoring of Sleep and Associated Events v2.3 (April 2016) with a hypopnea requiring 4% desaturations.  The channels recorded and monitored were frontal, central and occipital EEG, electrooculogram (EOG), submentalis EMG (chin), nasal and oral airflow, thoracic and abdominal wall motion, anterior tibialis EMG, snore microphone, electrocardiogram, and pulse oximetry.  MEDICATIONS Medications self-administered by patient taken the night of the study : CLONAZEPAM  SLEEP ARCHITECTURE The study was initiated at 11:15:30 PM and ended at 5:37:37 AM.  Sleep onset time was 29.7 minutes and the sleep efficiency was 69.4%. The total sleep time was 265 minutes.  Stage REM latency was 263.5 minutes.  The patient spent 21.5% of the night in stage N1 sleep, 62.6% in stage N2 sleep, 0.0% in stage N3 and 15.9% in REM.  Alpha intrusion was absent.  Supine sleep was 40.38%.  RESPIRATORY PARAMETERS The overall apnea/hypopnea index (AHI) was 70.6 per hour. There were 55 total apneas, including 0 obstructive, 41 central and 14 mixed apneas. There were 257 hypopneas and 13 RERAs.  The AHI during Stage REM sleep was 30.0 per hour.  AHI while supine was 96.4 per hour.  The mean oxygen saturation was 90.1%. The minimum SpO2 during sleep was 84.0%.  moderate snoring was noted during this study.  CARDIAC DATA The 2 lead EKG demonstrated sinus rhythm. The mean heart rate  was 52.4 beats per minute. Other EKG findings include: None.  LEG MOVEMENT DATA The total PLMS were 0 with a resulting PLMS index of 0.0. Associated arousal with leg movement index was 0.0 .  IMPRESSIONS - Severe obstructive sleep apnea occurred during this study (AHI = 70.6/h). - Mild central sleep apnea occurred during this study (CAI = 9.3/h). - Mild oxygen desaturation was noted during this study (Min O2 = 84.0%). - The patient snored with moderate snoring volume. - No cardiac abnormalities were noted during this study. - Clinically significant periodic limb movements did not occur during sleep. No significant associated arousals.  DIAGNOSIS - Obstructive Sleep Apnea (G47.33) - Central Sleep Apnea (G47.37) - Nocturnal Hypoxemia (G47.36)  RECOMMENDATIONS - CPAP titration to determine optimal pressure required to alleviate sleep disordered breathing. BiPAP or ASV titration may be required to eliminate central sleep apnea. - Positional therapy avoiding supine position during sleep. - Avoid alcohol, sedatives and other CNS depressants that may worsen sleep apnea and disrupt normal sleep architecture. - Sleep hygiene should be reviewed to assess factors that may improve sleep quality. - Weight management and regular exercise should be initiated or continued if appropriate.  [Electronically signed] 12/14/2020 04:31 PM  Armanda Magic MD, ABSM Diplomate, American Board of Sleep Medicine

## 2020-12-19 ENCOUNTER — Telehealth: Payer: Self-pay | Admitting: *Deleted

## 2020-12-19 DIAGNOSIS — G4733 Obstructive sleep apnea (adult) (pediatric): Secondary | ICD-10-CM

## 2020-12-19 NOTE — Telephone Encounter (Signed)
-----   Message from Quintella Reichert, MD sent at 12/14/2020  4:33 PM EST ----- Please let patient know that they have sleep apnea.  Recommend therapeutic CPAP titration for treatment of patient's sleep disordered breathing.  If unable to perform an in lab titration then initiate ResMed auto CPAP from 4 to 15cm H2O with heated humidity and mask of choice and overnight pulse ox on CPAP.

## 2020-12-24 ENCOUNTER — Other Ambulatory Visit (HOSPITAL_COMMUNITY): Payer: Self-pay | Admitting: *Deleted

## 2020-12-24 MED ORDER — FLECAINIDE ACETATE 100 MG PO TABS: 100.0000 mg | ORAL_TABLET | Freq: Two times a day (BID) | ORAL | 3 refills | Status: DC

## 2021-01-01 ENCOUNTER — Telehealth: Payer: Self-pay | Admitting: *Deleted

## 2021-01-01 NOTE — Telephone Encounter (Signed)
Left message for the pt regarding [place] Sleep Study. Left message to please call the office back to discuss further.

## 2021-01-05 NOTE — Telephone Encounter (Signed)
Prior Authorization for TITRATION sent to [facility] via web portal. TITRATION DENIED APAP AUTHORIZED VALID DTES  01-17-21 THRU 04-16-21 ORDER #  [id number]  Upon patient request DME selection is [facility] Patient understands he will be contacted by [facility] to set up his cpap. Patient understands to call if [facility] does not contact him with new setup in a timely manner. Patient understands they will be called once confirmation has been received from [facility] that they have received their new machine to schedule 10 week follow up appointment.   [facility] notified of new cpap order  Please add to airview Patient was grateful for the call and thanked me

## 2021-01-06 NOTE — Telephone Encounter (Signed)
I left a detailed message for the patient regarding [facility] Study. Left message for the patient that [facility] Sleep Study needs to be completed or returned within 2 weeks of 01/01/21 date. Made patient aware that if the sleep study has not been done within this timeframe and the device has not been returned, that this will be handed over to billing per the signed waiver agreement.   Left message for the pt to proceed with the study by 01/15/21. If not going to proceed then will need to have device returned by 01/15/21.

## 2021-01-06 NOTE — Telephone Encounter (Signed)
Orders faxed to [facility].  °

## 2021-01-14 NOTE — Telephone Encounter (Signed)
I s/w the pt toady in regard to Justin Young sleep study. Pt states he was going to return when he comes back for his appt in a couple of weeks. I did ask the pt if at all possible if he could return the device by tomorrow. I did inform the pt that I had left a couple of messages about returning the device. Pt said no problem he will return the device by tomorrow 01/15/21. I thanked the pt for his help in this matter.

## 2021-01-25 ENCOUNTER — Encounter: Payer: Self-pay | Admitting: *Deleted

## 2021-01-25 NOTE — Telephone Encounter (Signed)
Left message for the pt today. Following up as to pt returning [facility] Sleep study device. See previous notes. I will send a letter to the pt about device return. Pt does not have MY CHART.

## 2021-01-25 NOTE — Progress Notes (Signed)
This encounter was created in error - please disregard.

## 2021-02-08 ENCOUNTER — Other Ambulatory Visit: Payer: Self-pay

## 2021-02-08 ENCOUNTER — Ambulatory Visit (HOSPITAL_COMMUNITY)
Admission: RE | Admit: 2021-02-08 | Discharge: 2021-02-08 | Disposition: A | Discharge status: Home / Self Care | Payer: BC Managed Care – PPO | Source: Ambulatory Visit | Attending: Physician Assistant | Admitting: Physician Assistant

## 2021-02-08 ENCOUNTER — Encounter (HOSPITAL_COMMUNITY): Payer: Self-pay | Admitting: Physician Assistant

## 2021-02-08 ENCOUNTER — Encounter (HOSPITAL_COMMUNITY): Payer: Self-pay

## 2021-02-08 VITALS — BP 132/76 | HR 50 | Ht 70.0 in | Wt 222.6 lb

## 2021-02-08 DIAGNOSIS — I10 Essential (primary) hypertension: Secondary | ICD-10-CM | POA: Insufficient documentation

## 2021-02-08 DIAGNOSIS — E669 Obesity, unspecified: Secondary | ICD-10-CM | POA: Insufficient documentation

## 2021-02-08 DIAGNOSIS — I480 Paroxysmal atrial fibrillation: Secondary | ICD-10-CM | POA: Insufficient documentation

## 2021-02-08 DIAGNOSIS — Z6831 Body mass index (BMI) 31.0-31.9, adult: Secondary | ICD-10-CM | POA: Insufficient documentation

## 2021-02-08 DIAGNOSIS — Z7901 Long term (current) use of anticoagulants: Secondary | ICD-10-CM | POA: Diagnosis not present

## 2021-02-08 DIAGNOSIS — I4891 Unspecified atrial fibrillation: Secondary | ICD-10-CM | POA: Diagnosis present

## 2021-02-08 DIAGNOSIS — G4733 Obstructive sleep apnea (adult) (pediatric): Secondary | ICD-10-CM | POA: Diagnosis not present

## 2021-02-08 MED ORDER — FLECAINIDE ACETATE 100 MG PO TABS: 50.0000 mg | ORAL_TABLET | Freq: Two times a day (BID) | ORAL | 3 refills | Status: DC

## 2021-02-08 MED ORDER — METOPROLOL TARTRATE 25 MG PO TABS: 12.5000 mg | ORAL_TABLET | Freq: Two times a day (BID) | ORAL | Status: DC

## 2021-02-08 NOTE — Patient Instructions (Signed)
Decrease flecainide to 50mg  twice a day  Decrease metoprolol to 1/2 tablet twice a day

## 2021-02-08 NOTE — Progress Notes (Signed)
Primary Care Physician: [facility] Cardiologist: Dr Harrell Gave  Primary Electrophysiologist: Dr Rayann Heman (previously) Referring Physician: Dr Rayann Heman   Justin VILLASENOR is a 61 y.o. male with a history of HTN and atrial fibrillation who presents for follow up in the [facility]. The patient was initially diagnosed with atrial fibrillation 07/02/20 after presenting to the ED with symptoms of chest pain and palpitations. He spontaneously converted to SR. Patient was started on Xarelto for a CHADS2VASC score of 1. This was changed to Eliquis due to side effects. He was seen by Dr Rayann Heman on 09/28/20 and started on flecainide.   On follow up today, patient reports that he has been having more symptoms of peripheral vision blurring and dizziness on the flecainide. His afib is well controlled with only very brief palpitations. He denies any bleeding issues on anticoagulation. Of note, he has been diagnosed with severe OSA and is going next week to pick up his machine.   Today, he denies symptoms of shortness of breath, orthopnea, PND, lower extremity edema, presyncope, syncope, bleeding, or neurologic sequela. The patient is tolerating medications without difficulties and is otherwise without complaint today.    Atrial Fibrillation Risk Factors:  he does have symptoms or diagnosis of sleep apnea. he does not have a history of rheumatic fever. he does not have a history of alcohol use. The patient does have a history of early familial atrial fibrillation or other arrhythmias. Father had afib.   he has a BMI of Body mass index is 31.94 kg/m.Marland Kitchen Filed Weights   02/08/21 1050  Weight: 101 kg      Family History  Problem Relation Age of Onset   Arthritis Mother    Cancer Mother        Ovarian cancer primary,colon cancer.   Diabetes Mother    Colon cancer Mother    Alcohol abuse Father    Hyperlipidemia Father    Hypertension  Father    Heart disease Father    Heart disease Sister    Hypertension Sister    Sleep apnea Sister    Arthritis Maternal Grandmother      Atrial Fibrillation Management history:  Previous antiarrhythmic drugs: flecainide  Previous cardioversions: none Previous ablations: none CHADS2VASC score: 1 Anticoagulation history: Xarelto, Eliquis   Past Medical History:  Diagnosis Date   Arthritis    GERD (gastroesophageal reflux disease)    mild -rolaids if necessary   Hypertension    Insomnia    Paroxysmal atrial fibrillation ([place])    Spinal stenosis at L4-L5 level    Syncope 2020   Past Surgical History:  Procedure Laterality Date   ANKLE RECONSTRUCTION  left   3 procedures total   CLAVICLE SURGERY     bilateral   KNEE ARTHROSCOPY  2006-most current   left knee X 2 1 ON RIGHT   KNEE ARTHROSCOPY WITH MEDIAL MENISECTOMY  01/27/2012   Procedure: KNEE ARTHROSCOPY WITH MEDIAL MENISECTOMY;  Surgeon: Tobi Bastos, MD;  Location: [place];  Service: Orthopedics;  Laterality: Left;   LUMBAR LAMINECTOMY/DECOMPRESSION MICRODISCECTOMY N/A 03/16/2016   Procedure: CENTRAL DECOMPRESSIVE LUMBAR LAMINECTOMY LUMBAR FOUR TO LUMBAR FIVE;  Surgeon: Latanya Maudlin, MD;  Location: [facility];  Service: Orthopedics;  Laterality: N/A;   TONSILLECTOMY  AS CHILD    Current Outpatient Medications  Medication Sig Dispense Refill   acetaminophen (TYLENOL 8 HOUR) 650 MG CR tablet Take 1 tablet (650 mg total) by mouth every  8 (eight) hours as needed. 30 tablet 0   apixaban (ELIQUIS) 5 MG TABS tablet Take 1 tablet (5 mg total) by mouth 2 (two) times daily. 180 tablet 3   clonazePAM (KLONOPIN) 0.5 MG tablet Take 0.5 mg by mouth at bedtime as needed for sleep.     diltiazem (CARDIZEM) 60 MG tablet Take 1 tablet (60 mg total) by mouth 4 (four) times daily. 30 tablet 2   flecainide (TAMBOCOR) 100 MG tablet Take 1 tablet (100 mg total) by mouth 2 (two) times daily. 60 tablet 3   loratadine  (CLARITIN) 10 MG tablet Take 10 mg by mouth daily.     metoprolol tartrate (LOPRESSOR) 25 MG tablet Take 0.5 tablets (12.5 mg total) by mouth 2 (two) times daily. (Patient taking differently: Take 25 mg by mouth 2 (two) times daily.)     omeprazole (PRILOSEC) 20 MG capsule Take 20 mg by mouth daily at 6 PM.     ondansetron (ZOFRAN ODT) 8 MG disintegrating tablet Take 1 tablet (8 mg total) by mouth every 8 (eight) hours as needed for nausea. 20 tablet 0   losartan (COZAAR) 25 MG tablet TAKE 1 TABLET (25 MG TOTAL) BY MOUTH DAILY. 90 tablet 3   No current facility-administered medications for this encounter.    Allergies  Allergen Reactions   Amlodipine Swelling and Other (See Comments)    Water retention   Sulfa Antibiotics Other (See Comments)    From when younger.   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  Tobacco Use   Smoking status: Former    Packs/day: 3.00    Years: 30.00    Pack years: 90.00    Types: Cigarettes    Quit date: 01/25/2007    Years since quitting: 14.0   Smokeless tobacco: Never   Tobacco comments:    Former smoker 11/02/2020  Vaping Use   Vaping Use: Never used  Substance and Sexual Activity   Alcohol use: Not Currently    Alcohol/week: 7.0 standard drinks    Types: 7 Shots of liquor per week    Comment: Stop drinking 6 days ago(couple vodka drinks per day) 02/08/21   Drug use: No   Sexual activity: Yes    Partners: Female  Other Topics Concern   Not on file  Social History Narrative   HSG, [employer] - BS [place], [employer] business classes.Married '84 wife Neoma Laming: 3 kids - g b g: '86, '88, '95. 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  Physical Exam: Vitals:   02/08/21 1050  BP: 132/76  Pulse: (!) 50  Weight: 101 kg  Height: 5\' 10"  (1.778 m)    GEN-  The patient is a well appearing obese male, alert and oriented x 3 today.   HEENT-head normocephalic, atraumatic, sclera clear, conjunctiva pink, hearing intact, trachea midline. Lungs- Clear to ausculation bilaterally, normal work of breathing Heart- Regular rate and rhythm, bradycardia, no murmurs, rubs or gallops  GI- soft, NT, ND, + BS Extremities- no clubbing, cyanosis, or edema MS- no significant deformity or atrophy Skin- no rash or lesion Psych- euthymic mood, full affect Neuro- strength and sensation are intact   Wt Readings from Last 3 Encounters:  02/08/21 101 kg  11/30/20 97.5 kg  11/09/20 101.8 kg    EKG today demonstrates  SB, inc RBBB Vent. rate 50 BPM PR interval 182 ms QRS duration 118 ms QT/QTcB 422/384 ms  Echo 07/29/20 demonstrated   1. Left ventricular ejection fraction, by estimation, is 55 to 60%. Left  ventricular ejection fraction by 3D volume is 59 %. The left ventricle has normal function. The left ventricle has no regional wall motion  abnormalities. There is mild asymmetric left ventricular hypertrophy of the basal-septal segment. Left ventricular diastolic parameters are consistent with Grade I diastolic dysfunction (impaired relaxation). The average left ventricular global longitudinal strain is -24.6 %. The global longitudinal strain is normal.   2. Right ventricular systolic function is  normal. The right ventricular  size is normal. Tricuspid regurgitation signal is inadequate for assessing PA pressure.   3. The mitral valve is normal in structure. Trivial mitral valve  regurgitation. No evidence of mitral stenosis.   4. The aortic valve is tricuspid. There is mild calcification of the  aortic valve. There is mild thickening of the aortic valve. Aortic valve  regurgitation is not visualized. Mild aortic valve sclerosis is present,  with no evidence of aortic valve  stenosis.   5. Aortic dilatation noted. There is mild dilatation of the aortic root,  measuring 39 mm. There is mild dilatation of the ascending aorta,  measuring 39 mm.   6. The inferior vena cava is normal in size with greater than 50%  respiratory variability, suggesting right atrial pressure of 3 mmHg.   Comparison(s): No significant change from prior study. 06/05/17 EF 55-60%.   Epic records are reviewed at length today  CHA2DS2-VASc Score = 1  The patient's score is based upon: CHF History: 0 HTN History: 1 Diabetes History: 0 Stroke History: 0 Vascular Disease History: 0 Age Score: 0 Gender Score: 0        ASSESSMENT AND PLAN: 1. Paroxysmal Atrial Fibrillation (ICD10:  I48.0) The patient's CHA2DS2-VASc score is 1, indicating a 0.6% annual risk of stroke.   Patient doing well maintaining SR but is having side effects with higher doses of medications.  Decrease flecainide to 50 mg BID Decrease Lopressor to 12.5 mg BID (patient did not decrease at last visit) Continue Eliquis 5 mg BID Patient is interested in rhythm control without medications. Will refer to EP for ablation consideration.   2. Obesity Body mass index is 31.94 kg/m. Lifestyle modification was discussed and encouraged including regular physical activity and weight reduction.  3. OSA Severe OSA, mild central sleep apnea. Patient starting CPAP soon.   4. HTN Stable, med changes as above.   Follow up with EP for  ablation evaluation.    [facility] [address] [place], [place] [address] [phone] 02/08/2021 11:07 AM

## 2021-02-11 NOTE — Telephone Encounter (Signed)
Handed over to Brittany Currie, RN Clinic Supervisor to send to billing.  °

## 2021-03-23 ENCOUNTER — Encounter: Payer: Self-pay | Admitting: *Deleted

## 2021-03-23 ENCOUNTER — Ambulatory Visit (INDEPENDENT_AMBULATORY_CARE_PROVIDER_SITE_OTHER): Payer: BC Managed Care – PPO | Admitting: Cardiology

## 2021-03-23 ENCOUNTER — Other Ambulatory Visit: Payer: Self-pay

## 2021-03-23 ENCOUNTER — Encounter: Payer: Self-pay | Admitting: Cardiology

## 2021-03-23 VITALS — BP 120/62 | HR 62 | Ht 70.0 in | Wt 222.2 lb

## 2021-03-23 DIAGNOSIS — G4733 Obstructive sleep apnea (adult) (pediatric): Secondary | ICD-10-CM

## 2021-03-23 DIAGNOSIS — I480 Paroxysmal atrial fibrillation: Secondary | ICD-10-CM | POA: Diagnosis not present

## 2021-03-23 DIAGNOSIS — Z01818 Encounter for other preprocedural examination: Secondary | ICD-10-CM | POA: Diagnosis not present

## 2021-03-23 DIAGNOSIS — I10 Essential (primary) hypertension: Secondary | ICD-10-CM | POA: Diagnosis not present

## 2021-03-23 NOTE — Progress Notes (Signed)
Electrophysiology Office Note:    Date:  03/23/2021   ID:  Justin Young, DOB 1960-04-23, MRN [id number]  PCP:  [place] Cardiologist:  Buford Dresser, MD  [facility] Electrophysiologist:  Vickie Epley, MD   Referring MD: Josefa Half*   Chief Complaint: Consult for Afib ablation  History of Present Illness:    Justin Young is a 61 y.o. male who presents for an evaluation for atrial fibrillation ablation at the request of Adline Peals, [place]. Their medical history includes paroxysmal atrial fibrillation, hypertension, arthritis, GERD, OSA, and L4-5 spinal stenosis.  Justin Young last saw Adline Peals, [place] on 02/08/2021. He reported worsening blurry vision and dizziness on flecainide. He was also scheduled to pick up his CPAP soon for severe OSA. Flecainide was reduced to 50 mg BID, and Lopressor was decreased to 12.5 mg BID. He was referred to EP as he was interested in rhythm control without medications.  Previously seen by Dr. Rayann Heman 09/28/20, started on flecainide.  Overall, he appears well. His atrial fibrillation started suddenly about a year ago. He complains of headaches that he attributes to taking flecainide.  When in Afib, he generally feels his "heart fluttering around" along with discomfort/pain in his chest. He also feels fatigued. His longest episode was his initial episode, lasting 12 hours. After a few months the episodes improved on medication. Lately his episodes may last for a minute or two, about 1-2 times a week. However, he states the episodes are still disruptive, and stop him in his tracks.  He has been diagnosed with sleep apnea, but he is not wearing a CPAP as it is difficult for him to wear. He has been trying to pursue an Inspire device.  He denies any peripheral edema. No lightheadedness, syncope, or PND.  In his family, his father also had Afib, died of a stroke.     Past Medical  History:  Diagnosis Date   Arthritis    GERD (gastroesophageal reflux disease)    mild -rolaids if necessary   Hypertension    Insomnia    Paroxysmal atrial fibrillation ([place])    Spinal stenosis at L4-L5 level    Syncope 2020    Past Surgical History:  Procedure Laterality Date   ANKLE RECONSTRUCTION  left   3 procedures total   CLAVICLE SURGERY     bilateral   KNEE ARTHROSCOPY  2006-most current   left knee X 2 1 ON RIGHT   KNEE ARTHROSCOPY WITH MEDIAL MENISECTOMY  01/27/2012   Procedure: KNEE ARTHROSCOPY WITH MEDIAL MENISECTOMY;  Surgeon: Tobi Bastos, MD;  Location: [place];  Service: Orthopedics;  Laterality: Left;   LUMBAR LAMINECTOMY/DECOMPRESSION MICRODISCECTOMY N/A 03/16/2016   Procedure: CENTRAL DECOMPRESSIVE LUMBAR LAMINECTOMY LUMBAR FOUR TO LUMBAR FIVE;  Surgeon: Latanya Maudlin, MD;  [facility];  Service: Orthopedics;  Laterality: N/A;   TONSILLECTOMY  AS CHILD    Current Medications: Current Meds  Medication Sig   acetaminophen (TYLENOL 8 HOUR) 650 MG CR tablet Take 1 tablet (650 mg total) by mouth every 8 (eight) hours as needed.   apixaban (ELIQUIS) 5 MG TABS tablet Take 1 tablet (5 mg total) by mouth 2 (two) times daily.   clonazePAM (KLONOPIN) 0.5 MG tablet Take 0.5 mg by mouth at bedtime as needed for sleep.   flecainide (TAMBOCOR) 100 MG tablet Take 0.5 tablets (50 mg total) by mouth 2 (two) times daily.   loratadine (CLARITIN) 10 MG tablet  Take 10 mg by mouth daily.   losartan (COZAAR) 25 MG tablet TAKE 1 TABLET (25 MG TOTAL) BY MOUTH DAILY.   metoprolol tartrate (LOPRESSOR) 25 MG tablet Take 0.5 tablets (12.5 mg total) by mouth 2 (two) times daily.   omeprazole (PRILOSEC) 20 MG capsule Take 20 mg by mouth daily at 6 PM.     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  Tobacco Use   Smoking status: Former    Packs/day: 3.00    Years: 30.00    Pack years: 90.00    Types: Cigarettes    Quit date: 01/25/2007    Years since quitting: 14.1   Smokeless tobacco: Never   Tobacco comments:    Former smoker 11/02/2020  Vaping Use   Vaping Use: Never used  Substance and Sexual Activity   Alcohol use: Not Currently    Alcohol/week: 7.0 standard drinks    Types: 7 Shots of liquor per week    Comment: Stop drinking 6 days ago(couple vodka drinks per day) 02/08/21   Drug use: No   Sexual activity: Yes    Partners: Female  Other Topics Concern   Not on file  Social History Narrative   HSG, [employer] - BS [place], [employer] business classes.Married '84 wife Neoma Laming: 3 kids - g b g: '86, '88, '95. 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[employer], Occupational psychologist at [employer], Archivist.Regular exercise: activeColonoscopy 7 yrs ago      Owns Glass blower/designer company   Social Determinants of Radio broadcast assistant Strain: Not on file  Food Insecurity: Not on file  Transportation Needs: Not on file  Physical Activity: Not on file  Stress: Not on file  Social Connections: Not on file     Family History: The patient's family history includes Alcohol abuse in his father; Arthritis in his maternal grandmother and mother; Cancer in his mother; Colon cancer in his mother; Diabetes in his mother; Heart disease in his father and sister; Hyperlipidemia in his father; Hypertension in his father and sister; Sleep apnea in his sister.  ROS:   Please see the history of present illness.    (+) Headaches (+) Palpitations (+) Chest pain (+) Fatigue (+) Sleep apnea All other systems reviewed and are negative.  EKGs/Labs/Other Studies Reviewed:    The following studies were reviewed today:  Echo  07/29/2020:  1. Left ventricular ejection fraction, by estimation, is 55 to 60%. Left  ventricular ejection fraction by 3D volume is 59 %. The left ventricle has  normal function. The left ventricle has no regional wall motion  abnormalities. There is mild asymmetric  left ventricular hypertrophy of the basal-septal segment. Left ventricular  diastolic parameters are consistent with Grade I diastolic dysfunction  (impaired relaxation). The average left ventricular global longitudinal  strain is -24.6 %. The global  longitudinal strain is normal.   2. Right ventricular systolic function is normal. The right ventricular  size is normal. Tricuspid regurgitation signal is inadequate for assessing  PA pressure.   3. The mitral valve is normal in structure. Trivial mitral valve  regurgitation. No evidence of mitral stenosis.  4. The aortic valve is tricuspid. There is mild calcification of the  aortic valve. There is mild thickening of the aortic valve. Aortic valve  regurgitation is not visualized. Mild aortic valve sclerosis is present,  with no evidence of aortic valve  stenosis.   5. Aortic dilatation noted. There is mild dilatation of the aortic root,  measuring 39 mm. There is mild dilatation of the ascending aorta,  measuring 39 mm.   6. The inferior vena cava is normal in size with greater than 50%  respiratory variability, suggesting right atrial pressure of 3 mmHg.   Comparison(s): No significant change from prior study. 06/05/17 EF 55-60%.   EKG:   EKG is personally reviewed.  03/23/2021: Sinus rhythm.  PR interval 182 ms.  QRS duration 116 ms.  Incomplete right bundle branch block.   Recent Labs: 07/02/2020: BUN 16; Creatinine, Ser 0.81; Hemoglobin 15.6; Magnesium 1.9; Platelets 205; Potassium 5.6; Sodium 137   Recent Lipid Panel    Component Value Date/Time   CHOL 179 09/09/2014 1150   TRIG 191.0 (H) 09/09/2014 1150   HDL 40.10 09/09/2014 1150   CHOLHDL 4 09/09/2014 1150    VLDL 38.2 09/09/2014 1150   LDLCALC 100 (H) 09/09/2014 1150   LDLDIRECT 134.8 03/07/2012 1551    Physical Exam:    VS:  BP 120/62    Pulse 62    Ht 5\' 10"  (1.778 m)    Wt 222 lb 3.2 oz (100.8 kg)    SpO2 96%    BMI 31.88 kg/m     Wt Readings from Last 3 Encounters:  03/23/21 222 lb 3.2 oz (100.8 kg)  02/08/21 222 lb 9.6 oz (101 kg)  11/30/20 215 lb (97.5 kg)     GEN: Well nourished, well developed in no acute distress HEENT: Normal NECK: No JVD; No carotid bruits LYMPHATICS: No lymphadenopathy CARDIAC: RRR, no murmurs, rubs, gallops RESPIRATORY:  Clear to auscultation without rales, wheezing or rhonchi  ABDOMEN: Soft, non-tender, non-distended MUSCULOSKELETAL:  No edema; No deformity  SKIN: Warm and dry NEUROLOGIC:  Alert and oriented x 3 PSYCHIATRIC:  Normal affect       ASSESSMENT:    1. Paroxysmal atrial fibrillation (HCC)   2. Primary hypertension   3. OSA (obstructive sleep apnea)   4. Pre-op evaluation    PLAN:    In order of problems listed above:  #Paroxysmal atrial fibrillation Symptomatic.  Occurring despite treatment with flecainide.  On Eliquis for stroke prophylaxis.  I discussed treatment options for his atrial fibrillation including antiarrhythmic drug therapy and catheter ablation.  He would like to proceed with catheter ablation.  I discussed the procedure in detail during today's visit including the risks, recovery and likelihood of success.  I discussed the potential need for future ablation procedures or antiarrhythmic drug after our initial ablation.  Risk, benefits, and alternatives to EP study and radiofrequency ablation for afib were also discussed in detail today. These risks include but are not limited to stroke, bleeding, vascular damage, tamponade, perforation, damage to the esophagus, lungs, and other structures, pulmonary vein stenosis, worsening renal function, and death. The patient understands these risk and wishes to proceed.  We will  therefore proceed with catheter ablation at the next available time.  Carto, ICE, anesthesia are requested for the procedure.  Will also obtain CT PV protocol prior to the procedure to exclude LAA thrombus and further evaluate atrial anatomy.  #Hypertension Controlled Continue diltiazem, losartan, metoprolol.  #Obstructive sleep apnea Has not tolerated CPAP  in the past.  I am going to refer him to one of my colleagues who is a specialist in sleep medicine to discuss the inspire device.  Total time spent with patient today 60 minutes. This includes reviewing records, evaluating the patient and coordinating care.  Medication Adjustments/Labs and Tests Ordered: Current medicines are reviewed at length with the patient today.  Concerns regarding medicines are outlined above.  Orders Placed This Encounter  Procedures   CT CARDIAC MORPH/PULM VEIN W/CM&W/O CA SCORE   CBC w/Diff   Basic Metabolic Panel (BMET)   EKG 12-Lead   No orders of the defined types were placed in this encounter.   I,Mathew Stumpf,acting as a Education administrator for Vickie Epley, MD.,have documented all relevant documentation on the behalf of Vickie Epley, MD,as directed by  Vickie Epley, MD while in the presence of Vickie Epley, MD.   I, Vickie Epley, MD, have reviewed all documentation for this visit. The documentation on 03/23/21 for the exam, diagnosis, procedures, and orders are all accurate and complete.   Signed, Hilton Cork. Quentin Ore, MD, [facility], [facility] 03/23/2021 7:55 PM    Electrophysiology [facility]

## 2021-03-23 NOTE — Patient Instructions (Addendum)
Medication Instructions:  °Your physician recommends that you continue on your current medications as directed. Please refer to the Current Medication list given to you today. °*If you need a refill on your cardiac medications before your next appointment, please call your pharmacy* ° °Lab Work: °None. °If you have labs (blood work) drawn today and your tests are completely normal, you will receive your results only by: °MyChart Message (if you have MyChart) OR °A paper copy in the mail °If you have any lab test that is abnormal or we need to change your treatment, we will call you to review the results. ° °Testing/Procedures: °Your physician has requested that you have cardiac CT. Cardiac computed tomography (CT) is a painless test that uses an x-ray machine to take clear, detailed pictures of your heart. For further information please visit www.cardiosmart.org. Please follow instruction sheet as given. °  °Your physician has recommended that you have an ablation. Catheter ablation is a medical procedure used to treat some cardiac arrhythmias (irregular heartbeats). During catheter ablation, a long, thin, flexible tube is put into a blood vessel in your groin (upper thigh), or neck. This tube is called an ablation catheter. It is then guided to your heart through the blood vessel. Radio frequency waves destroy small areas of heart tissue where abnormal heartbeats may cause an arrhythmia to start. Please see the instruction sheet given to you today. ° ° °Follow-Up: °At [facility], you and your health needs are our priority.  As part of our continuing mission to provide you with exceptional heart care, we have created designated Provider Care Teams.  These Care Teams include your primary Cardiologist (physician) and Advanced Practice Providers (APPs -  Physician Assistants and Nurse Practitioners) who all work together to provide you with the care you need, when you need it. ° °Your physician wants you to  follow-up in: see instruction letter.  ° °We recommend signing up for the patient portal called "MyChart".  Sign up information is provided on this After Visit Summary.  MyChart is used to connect with patients for Virtual Visits (Telemedicine).  Patients are able to view lab/test results, encounter notes, upcoming appointments, etc.  Non-urgent messages can be sent to your provider as well.   °To learn more about what you can do with MyChart, go to https://www.mychart.com.   ° °Any Other Special Instructions Will Be Listed Below (If Applicable). ° °Cardiac Ablation °Cardiac ablation is a procedure to destroy (ablate) some heart tissue that is sending bad signals. These bad signals cause problems in heart rhythm. °The heart has many areas that make these signals. If there are problems in these areas, they can make the heart beat in a way that is not normal. Destroying some tissues can help make the heart rhythm normal. °Tell your doctor about: °Any allergies you have. °All medicines you are taking. These include vitamins, herbs, eye drops, creams, and over-the-counter medicines. °Any problems you or family members have had with medicines that make you fall asleep (anesthetics). °Any blood disorders you have. °Any surgeries you have had. °Any medical conditions you have, such as kidney failure. °Whether you are pregnant or may be pregnant. °What are the risks? °This is a safe procedure. But problems may occur, including: °Infection. °Bruising and bleeding. °Bleeding into the chest. °Stroke or blood clots. °Damage to nearby areas of your body. °Allergies to medicines or dyes. °The need for a pacemaker if the normal system is damaged. °Failure of the procedure to treat the problem. °  What happens before the procedure? °Medicines °Ask your doctor about: °Changing or stopping your normal medicines. This is important. °Taking aspirin and ibuprofen. Do not take these medicines unless your doctor tells you to take  them. °Taking other medicines, vitamins, herbs, and supplements. °General instructions °Follow instructions from your doctor about what you cannot eat or drink. °Plan to have someone take you home from the hospital or clinic. °If you will be going home right after the procedure, plan to have someone with you for 24 hours. °Ask your doctor what steps will be taken to prevent infection. °What happens during the procedure? ° °An IV tube will be put into one of your veins. °You will be given a medicine to help you relax. °The skin on your neck or groin will be numbed. °A cut (incision) will be made in your neck or groin. A needle will be put through your cut and into a large vein. °A tube (catheter) will be put into the needle. The tube will be moved to your heart. °Dye may be put through the tube. This helps your doctor see your heart. °Small devices (electrodes) on the tube will send out signals. °A type of energy will be used to destroy some heart tissue. °The tube will be taken out. °Pressure will be held on your cut. This helps stop bleeding. °A bandage will be put over your cut. °The exact procedure may vary among doctors and hospitals. °What happens after the procedure? °You will be watched until you leave the hospital or clinic. This includes checking your heart rate, breathing rate, oxygen, and blood pressure. °Your cut will be watched for bleeding. You will need to lie still for a few hours. °Do not drive for 24 hours or as long as your doctor tells you. °Summary °Cardiac ablation is a procedure to destroy some heart tissue. This is done to treat heart rhythm problems. °Tell your doctor about any medical conditions you may have. Tell him or her about all medicines you are taking to treat them. °This is a safe procedure. But problems may occur. These include infection, bruising, bleeding, and damage to nearby areas of your body. °Follow what your doctor tells you about food and drink. You may also be told to  change or stop some of your medicines. °After the procedure, do not drive for 24 hours or as long as your doctor tells you. °This information is not intended to replace advice given to you by your health care provider. Make sure you discuss any questions you have with your health care provider. °Document Revised: 12/06/2018 Document Reviewed: 12/06/2018 °Elsevier Patient Education © 2022 Elsevier Inc. ° ° ° °  ° ° °

## 2021-04-09 ENCOUNTER — Ambulatory Visit: Payer: BC Managed Care – PPO | Admitting: Cardiology

## 2021-06-07 ENCOUNTER — Other Ambulatory Visit: Payer: BC Managed Care – PPO | Admitting: *Deleted

## 2021-06-07 DIAGNOSIS — I480 Paroxysmal atrial fibrillation: Secondary | ICD-10-CM

## 2021-06-07 DIAGNOSIS — Z01818 Encounter for other preprocedural examination: Secondary | ICD-10-CM

## 2021-06-07 LAB — BASIC METABOLIC PANEL
BUN/Creatinine Ratio: 25 — ABNORMAL HIGH (ref 9–20)
BUN: 19 mg/dL (ref 6–24)
CO2: 26 mmol/L (ref 20–29)
Calcium: 9.7 mg/dL (ref 8.7–10.2)
Chloride: 102 mmol/L (ref 96–106)
Creatinine, Ser: 0.77 mg/dL (ref 0.76–1.27)
Glucose: 115 mg/dL — ABNORMAL HIGH (ref 70–99)
Potassium: 5 mmol/L (ref 3.5–5.2)
Sodium: 139 mmol/L (ref 134–144)
eGFR: 103 mL/min/{1.73_m2} (ref >59)

## 2021-06-07 LAB — CBC WITH DIFFERENTIAL/PLATELET
Basophils Absolute: 0 10*3/uL (ref 0.0–0.2)
Basos: 1 %
EOS (ABSOLUTE): 0.2 10*3/uL (ref 0.0–0.4)
Eos: 3 %
Hematocrit: 36.2 % — ABNORMAL LOW (ref 37.5–51.0)
Hemoglobin: 11.7 g/dL — ABNORMAL LOW (ref 13.0–17.7)
Lymphocytes Absolute: 1.9 10*3/uL (ref 0.7–3.1)
Lymphs: 29 %
MCH: 26.2 pg — ABNORMAL LOW (ref 26.6–33.0)
MCHC: 32.3 g/dL (ref 31.5–35.7)
MCV: 81 fL (ref 79–97)
Monocytes Absolute: 0.9 10*3/uL (ref 0.1–0.9)
Monocytes: 14 %
Neutrophils Absolute: 3.6 10*3/uL (ref 1.4–7.0)
Neutrophils: 53 %
Platelets: 225 10*3/uL (ref 150–450)
RBC: 4.47 x10E6/uL (ref 4.14–5.80)
RDW: 15.3 % (ref 11.6–15.4)
WBC: 6.7 10*3/uL (ref 3.4–10.8)

## 2021-06-21 ENCOUNTER — Telehealth (HOSPITAL_COMMUNITY): Payer: Self-pay | Admitting: *Deleted

## 2021-06-21 NOTE — Telephone Encounter (Signed)
Attempted to call patient regarding upcoming cardiac CT appointment. °Left message on voicemail with name and callback number ° °Mata Rowen RN Navigator Cardiac Imaging °[facility] °[phone] Office °[phone] Cell ° °

## 2021-06-22 ENCOUNTER — Ambulatory Visit (HOSPITAL_COMMUNITY)
Admission: RE | Admit: 2021-06-22 | Discharge: 2021-06-22 | Disposition: A | Discharge status: Home / Self Care | Payer: BC Managed Care – PPO | Source: Ambulatory Visit | Attending: Cardiology | Admitting: Cardiology

## 2021-06-22 DIAGNOSIS — I480 Paroxysmal atrial fibrillation: Secondary | ICD-10-CM | POA: Diagnosis not present

## 2021-06-22 MED ORDER — IOHEXOL 350 MG/ML SOLN
100.0000 mL | Freq: Once | INTRAVENOUS | Status: AC | PRN
  Administered 2021-06-22: 100 mL via INTRAVENOUS

## 2021-06-28 ENCOUNTER — Telehealth: Payer: Self-pay | Admitting: Cardiology

## 2021-06-28 NOTE — Anesthesia Preprocedure Evaluation (Addendum)
Anesthesia Evaluation  Patient identified by MRN, date of birth, ID band Patient awake    Reviewed: Allergy & Precautions, NPO status , Patient's Chart, lab work & pertinent test results  History of Anesthesia Complications Negative for: history of anesthetic complications  Airway Mallampati: II  TM Distance: >3 FB Neck ROM: Full    Dental no notable dental hx. (+) Dental Advisory Given   Pulmonary sleep apnea , former smoker,    Pulmonary exam normal        Cardiovascular hypertension, Pt. on medications and Pt. on home beta blockers Normal cardiovascular exam+ dysrhythmias Atrial Fibrillation   IMPRESSIONS    1. Left ventricular ejection fraction, by estimation, is 55 to 60%. Left  ventricular ejection fraction by 3D volume is 59 %. The left ventricle has  normal function. The left ventricle has no regional wall motion  abnormalities. There is mild asymmetric  left ventricular hypertrophy of the basal-septal segment. Left ventricular  diastolic parameters are consistent with Grade I diastolic dysfunction  (impaired relaxation). The average left ventricular global longitudinal  strain is -24.6 %. The global  longitudinal strain is normal.  2. Right ventricular systolic function is normal. The right ventricular  size is normal. Tricuspid regurgitation signal is inadequate for assessing  PA pressure.  3. The mitral valve is normal in structure. Trivial mitral valve  regurgitation. No evidence of mitral stenosis.  4. The aortic valve is tricuspid. There is mild calcification of the  aortic valve. There is mild thickening of the aortic valve. Aortic valve  regurgitation is not visualized. Mild aortic valve sclerosis is present,  with no evidence of aortic valve  stenosis.  5. Aortic dilatation noted. There is mild dilatation of the aortic root,  measuring 39 mm. There is mild dilatation of the ascending aorta,  measuring  39 mm.  6. The inferior vena cava is normal in size with greater than 50%  respiratory variability, suggesting right atrial pressure of 3 mmHg.   Comparison(s): No significant change from prior study. 06/05/17 EF 55-60%.    Neuro/Psych negative neurological ROS     GI/Hepatic Neg liver ROS, GERD  Medicated,  Endo/Other  negative endocrine ROS  Renal/GU negative Renal ROS     Musculoskeletal negative musculoskeletal ROS (+)   Abdominal   Peds  Hematology negative hematology ROS (+)   Anesthesia Other Findings   Reproductive/Obstetrics                            Anesthesia Physical Anesthesia Plan  ASA: 3  Anesthesia Plan: General   Post-op Pain Management: Minimal or no pain anticipated   Induction: Intravenous  PONV Risk Score and Plan: 2 and Ondansetron and Midazolam  Airway Management Planned: Oral ETT  Additional Equipment:   Intra-op Plan:   Post-operative Plan: Extubation in OR  Informed Consent: I have reviewed the patients History and Physical, chart, labs and discussed the procedure including the risks, benefits and alternatives for the proposed anesthesia with the patient or authorized representative who has indicated his/her understanding and acceptance.       Plan Discussed with: CRNA and Anesthesiologist  Anesthesia Plan Comments:         Anesthesia Quick Evaluation

## 2021-06-28 NOTE — Pre-Procedure Instructions (Signed)
Instructed patient on the following items: Arrival time 0830 Nothing to eat or drink after midnight No meds AM of procedure Responsible person to drive you home and stay with you for 24 hrs  Have you missed any doses of anti-coagulant Eliquis- hasn't missed any doses   

## 2021-06-28 NOTE — Telephone Encounter (Signed)
Pt called to clarify instructions for procedure tomorrow.

## 2021-06-28 NOTE — Telephone Encounter (Signed)
Patient said between his call and me calling him back someone at the hospital happened to call him and answered all off his questions. He had no other questions at this time.

## 2021-06-29 ENCOUNTER — Ambulatory Visit (HOSPITAL_BASED_OUTPATIENT_CLINIC_OR_DEPARTMENT_OTHER): Payer: BC Managed Care – PPO

## 2021-06-29 ENCOUNTER — Encounter (HOSPITAL_COMMUNITY)
Admission: RE | Disposition: A | Discharge status: Home / Self Care | Payer: Self-pay | Source: Ambulatory Visit | Attending: Cardiology

## 2021-06-29 ENCOUNTER — Ambulatory Visit (HOSPITAL_COMMUNITY): Payer: BC Managed Care – PPO | Admitting: Anesthesiology

## 2021-06-29 ENCOUNTER — Ambulatory Visit (HOSPITAL_COMMUNITY)
Admission: RE | Admit: 2021-06-29 | Discharge: 2021-06-29 | Disposition: A | Discharge status: Home / Self Care | Payer: BC Managed Care – PPO | Source: Ambulatory Visit | Attending: Cardiology | Admitting: Cardiology

## 2021-06-29 ENCOUNTER — Encounter (HOSPITAL_COMMUNITY): Payer: Self-pay | Admitting: Cardiology

## 2021-06-29 ENCOUNTER — Other Ambulatory Visit: Payer: Self-pay

## 2021-06-29 DIAGNOSIS — Z87891 Personal history of nicotine dependence: Secondary | ICD-10-CM | POA: Diagnosis not present

## 2021-06-29 DIAGNOSIS — Z7901 Long term (current) use of anticoagulants: Secondary | ICD-10-CM | POA: Diagnosis not present

## 2021-06-29 DIAGNOSIS — G4733 Obstructive sleep apnea (adult) (pediatric): Secondary | ICD-10-CM | POA: Diagnosis not present

## 2021-06-29 DIAGNOSIS — I10 Essential (primary) hypertension: Secondary | ICD-10-CM | POA: Insufficient documentation

## 2021-06-29 DIAGNOSIS — Z79899 Other long term (current) drug therapy: Secondary | ICD-10-CM | POA: Diagnosis not present

## 2021-06-29 DIAGNOSIS — I4891 Unspecified atrial fibrillation: Secondary | ICD-10-CM | POA: Diagnosis not present

## 2021-06-29 DIAGNOSIS — M48061 Spinal stenosis, lumbar region without neurogenic claudication: Secondary | ICD-10-CM | POA: Insufficient documentation

## 2021-06-29 DIAGNOSIS — I480 Paroxysmal atrial fibrillation: Secondary | ICD-10-CM | POA: Diagnosis present

## 2021-06-29 DIAGNOSIS — K219 Gastro-esophageal reflux disease without esophagitis: Secondary | ICD-10-CM | POA: Insufficient documentation

## 2021-06-29 HISTORY — PX: ATRIAL FIBRILLATION ABLATION: EP1191

## 2021-06-29 HISTORY — PX: TEE WITHOUT CARDIOVERSION: SHX5443

## 2021-06-29 LAB — POCT ACTIVATED CLOTTING TIME
Activated Clotting Time: 257 seconds
Activated Clotting Time: 305 seconds

## 2021-06-29 SURGERY — ATRIAL FIBRILLATION ABLATION
Anesthesia: General

## 2021-06-29 MED ORDER — AMISULPRIDE (ANTIEMETIC) 5 MG/2ML IV SOLN: 10.0000 mg | Freq: Once | INTRAVENOUS | Status: DC | PRN

## 2021-06-29 MED ORDER — APIXABAN 5 MG PO TABS
5.0000 mg | ORAL_TABLET | Freq: Two times a day (BID) | ORAL | Status: DC
  Administered 2021-06-29: 5 mg via ORAL
  Filled 2021-06-29: qty 1

## 2021-06-29 MED ORDER — PROMETHAZINE HCL 25 MG/ML IJ SOLN: 6.2500 mg | INTRAMUSCULAR | Status: DC | PRN

## 2021-06-29 MED ORDER — SODIUM CHLORIDE 0.9% FLUSH: 3.0000 mL | INTRAVENOUS | Status: DC | PRN

## 2021-06-29 MED ORDER — FENTANYL CITRATE (PF) 250 MCG/5ML IJ SOLN
INTRAMUSCULAR | Status: DC | PRN
  Administered 2021-06-29 (×2): 100 ug via INTRAVENOUS

## 2021-06-29 MED ORDER — ISOPROTERENOL HCL 0.2 MG/ML IJ SOLN
INTRAVENOUS | Status: DC | PRN
  Administered 2021-06-29: 4 ug/min via INTRAVENOUS

## 2021-06-29 MED ORDER — SODIUM CHLORIDE 0.9 % IV SOLN: INTRAVENOUS | Status: DC

## 2021-06-29 MED ORDER — SODIUM CHLORIDE 0.9 % IV SOLN: 250.0000 mL | INTRAVENOUS | Status: DC | PRN

## 2021-06-29 MED ORDER — ONDANSETRON HCL 4 MG/2ML IJ SOLN: 4.0000 mg | Freq: Four times a day (QID) | INTRAMUSCULAR | Status: DC | PRN

## 2021-06-29 MED ORDER — SODIUM CHLORIDE 0.9% FLUSH: 3.0000 mL | Freq: Two times a day (BID) | INTRAVENOUS | Status: DC

## 2021-06-29 MED ORDER — PANTOPRAZOLE SODIUM 40 MG PO TBEC
40.0000 mg | DELAYED_RELEASE_TABLET | Freq: Every day | ORAL | Status: DC
  Administered 2021-06-29: 40 mg via ORAL
  Filled 2021-06-29: qty 1

## 2021-06-29 MED ORDER — COLCHICINE 0.6 MG PO TABS
0.6000 mg | ORAL_TABLET | Freq: Two times a day (BID) | ORAL | Status: DC
  Administered 2021-06-29: 0.6 mg via ORAL
  Filled 2021-06-29: qty 1

## 2021-06-29 MED ORDER — COLCHICINE 0.6 MG PO TABS: 0.6000 mg | ORAL_TABLET | Freq: Two times a day (BID) | ORAL | 0 refills | Status: DC

## 2021-06-29 MED ORDER — FENTANYL CITRATE (PF) 100 MCG/2ML IJ SOLN: 25.0000 ug | INTRAMUSCULAR | Status: DC | PRN

## 2021-06-29 MED ORDER — HEPARIN SODIUM (PORCINE) 1000 UNIT/ML IJ SOLN
INTRAMUSCULAR | Status: DC | PRN
  Administered 2021-06-29: 1000 [IU] via INTRAVENOUS

## 2021-06-29 MED ORDER — ACETAMINOPHEN 500 MG PO TABS
1000.0000 mg | ORAL_TABLET | Freq: Once | ORAL | Status: AC
Start: 2021-06-29 — End: 2021-06-29
  Administered 2021-06-29: 1000 mg via ORAL
  Filled 2021-06-29: qty 2

## 2021-06-29 MED ORDER — PHENYLEPHRINE HCL-NACL 20-0.9 MG/250ML-% IV SOLN
INTRAVENOUS | Status: DC | PRN
  Administered 2021-06-29: 25 ug/min via INTRAVENOUS

## 2021-06-29 MED ORDER — ONDANSETRON HCL 4 MG/2ML IJ SOLN
INTRAMUSCULAR | Status: DC | PRN
  Administered 2021-06-29: 4 mg via INTRAVENOUS

## 2021-06-29 MED ORDER — PROPOFOL 10 MG/ML IV BOLUS
INTRAVENOUS | Status: DC | PRN
  Administered 2021-06-29: 50 mg via INTRAVENOUS
  Administered 2021-06-29: 150 mg via INTRAVENOUS

## 2021-06-29 MED ORDER — ISOPROTERENOL HCL 0.2 MG/ML IJ SOLN
INTRAMUSCULAR | Status: AC
  Filled 2021-06-29: qty 5

## 2021-06-29 MED ORDER — PHENYLEPHRINE 80 MCG/ML (10ML) SYRINGE FOR IV PUSH (FOR BLOOD PRESSURE SUPPORT)
PREFILLED_SYRINGE | INTRAVENOUS | Status: DC | PRN
Start: 2021-06-29 — End: 2021-06-29
  Administered 2021-06-29: 160 ug via INTRAVENOUS

## 2021-06-29 MED ORDER — PROTAMINE SULFATE 10 MG/ML IV SOLN
INTRAVENOUS | Status: DC | PRN
  Administered 2021-06-29: 35 mg via INTRAVENOUS

## 2021-06-29 MED ORDER — SUGAMMADEX SODIUM 200 MG/2ML IV SOLN
INTRAVENOUS | Status: DC | PRN
  Administered 2021-06-29: 200 mg via INTRAVENOUS

## 2021-06-29 MED ORDER — HEPARIN SODIUM (PORCINE) 1000 UNIT/ML IJ SOLN
INTRAMUSCULAR | Status: AC
  Filled 2021-06-29: qty 10

## 2021-06-29 MED ORDER — HEPARIN (PORCINE) IN NACL 1000-0.9 UT/500ML-% IV SOLN
INTRAVENOUS | Status: AC
Start: 2021-06-29 — End: ?
  Filled 2021-06-29: qty 2000

## 2021-06-29 MED ORDER — PANTOPRAZOLE SODIUM 40 MG PO TBEC: 40.0000 mg | DELAYED_RELEASE_TABLET | Freq: Every day | ORAL | 0 refills | Status: DC

## 2021-06-29 MED ORDER — HEPARIN SODIUM (PORCINE) 1000 UNIT/ML IJ SOLN
INTRAMUSCULAR | Status: DC | PRN
  Administered 2021-06-29: 15000 [IU] via INTRAVENOUS
  Administered 2021-06-29: 5000 [IU] via INTRAVENOUS
  Administered 2021-06-29: 7000 [IU] via INTRAVENOUS

## 2021-06-29 MED ORDER — CEFAZOLIN SODIUM-DEXTROSE 2-3 GM-%(50ML) IV SOLR
INTRAVENOUS | Status: DC | PRN
  Administered 2021-06-29: 2 g via INTRAVENOUS

## 2021-06-29 MED ORDER — ROCURONIUM BROMIDE 10 MG/ML (PF) SYRINGE
PREFILLED_SYRINGE | INTRAVENOUS | Status: DC | PRN
  Administered 2021-06-29: 100 mg via INTRAVENOUS

## 2021-06-29 MED ORDER — LIDOCAINE 2% (20 MG/ML) 5 ML SYRINGE
INTRAMUSCULAR | Status: DC | PRN
  Administered 2021-06-29: 100 mg via INTRAVENOUS

## 2021-06-29 MED ORDER — DEXAMETHASONE SODIUM PHOSPHATE 10 MG/ML IJ SOLN
INTRAMUSCULAR | Status: DC | PRN
  Administered 2021-06-29: 4 mg via INTRAVENOUS

## 2021-06-29 MED ORDER — HEPARIN (PORCINE) IN NACL 1000-0.9 UT/500ML-% IV SOLN
INTRAVENOUS | Status: DC | PRN
  Administered 2021-06-29 (×4): 500 mL

## 2021-06-29 MED ORDER — ACETAMINOPHEN 325 MG PO TABS: 650.0000 mg | ORAL_TABLET | ORAL | Status: DC | PRN

## 2021-06-29 MED ORDER — CEFAZOLIN SODIUM-DEXTROSE 2-4 GM/100ML-% IV SOLN
INTRAVENOUS | Status: AC
Start: 2021-06-29 — End: ?
  Filled 2021-06-29: qty 100

## 2021-06-29 SURGICAL SUPPLY — 19 items
CATH 8FR REPROCESSED SOUNDSTAR (CATHETERS) ×6 IMPLANT
CATH 8FR SOUNDSTAR REPROCESSED (CATHETERS) IMPLANT
CATH OCTARAY 2.0 F 3-3-3-3-3 (CATHETERS) ×2 IMPLANT
CATH SMTCH THERMOCOOL SF DF (CATHETERS) ×1 IMPLANT
CATH SOUNDSTAR ECO 8FR (CATHETERS) IMPLANT
CATH WEB BI DIR CSDF CRV REPRO (CATHETERS) ×1 IMPLANT
CLOSURE PERCLOSE PROSTYLE (VASCULAR PRODUCTS) ×4 IMPLANT
COVER SWIFTLINK CONNECTOR (BAG) ×4 IMPLANT
MAT PREVALON FULL STRYKER (MISCELLANEOUS) ×1 IMPLANT
PACK EP LATEX FREE (CUSTOM PROCEDURE TRAY) ×3
PACK EP LF (CUSTOM PROCEDURE TRAY) ×2 IMPLANT
PAD DEFIB RADIO PHYSIO CONN (PAD) ×3 IMPLANT
PATCH CARTO3 (PAD) ×1 IMPLANT
SHEATH BAYLIS TRANSSEPTAL 98CM (NEEDLE) ×1 IMPLANT
SHEATH CARTO VIZIGO SM CVD (SHEATH) ×1 IMPLANT
SHEATH PINNACLE 8F 10CM (SHEATH) ×2 IMPLANT
SHEATH PINNACLE 9F 10CM (SHEATH) ×1 IMPLANT
SHEATH PROBE COVER 6X72 (BAG) ×1 IMPLANT
TUBING SMART ABLATE COOLFLOW (TUBING) ×2 IMPLANT

## 2021-06-29 NOTE — Discharge Instructions (Signed)

## 2021-06-29 NOTE — Progress Notes (Signed)
Spoke with PA Andy/ EP, pt may wait to take bp meds at home

## 2021-06-29 NOTE — Anesthesia Postprocedure Evaluation (Signed)
Anesthesia Post Note  Patient: Justin Young  Procedure(s) Performed: ATRIAL FIBRILLATION ABLATION TRANSESOPHAGEAL ECHOCARDIOGRAM (TEE)     Patient location during evaluation: PACU Anesthesia Type: General Level of consciousness: sedated Pain management: pain level controlled Vital Signs Assessment: post-procedure vital signs reviewed and stable Respiratory status: spontaneous breathing and respiratory function stable Cardiovascular status: stable Postop Assessment: no apparent nausea or vomiting Anesthetic complications: no   No notable events documented.  Last Vitals:  Vitals:   06/29/21 1357 06/29/21 1400  BP:  (!) 159/91  Pulse: 62 62  Resp: 12 (!) 9  Temp: 36.8 C   SpO2: 93% 92%    Last Pain:  Vitals:   06/29/21 1357  TempSrc: Tympanic  PainSc:                  Abbygayle Helfand DANIEL

## 2021-06-29 NOTE — Anesthesia Procedure Notes (Signed)
Procedure Name: Intubation Date/Time: 06/29/2021 10:51 AM  Performed by: Anastasio Auerbach, CRNAPre-anesthesia Checklist: Patient identified, Emergency Drugs available, Suction available and Patient being monitored Patient Re-evaluated:Patient Re-evaluated prior to induction Oxygen Delivery Method: Circle system utilized Preoxygenation: Pre-oxygenation with 100% oxygen Induction Type: IV induction Ventilation: Mask ventilation without difficulty Laryngoscope Size: Mac and 3 Grade View: Grade III Tube type: Oral Tube size: 8.0 mm Number of attempts: 1 Airway Equipment and Method: Stylet and Oral airway Placement Confirmation: ETT inserted through vocal cords under direct vision, positive ETCO2 and breath sounds checked- equal and bilateral Secured at: 23 cm Tube secured with: Tape Dental Injury: Teeth and Oropharynx as per pre-operative assessment

## 2021-06-29 NOTE — H&P (Signed)
Electrophysiology Office Note:     Date:  06/29/2021    ID:  Justin Young, DOB 04/15/1960, MRN [id number]   PCP:  [name]     [facility] Cardiologist:  Jodelle Red, MD  [facility] Electrophysiologist:  Justin Prude, MD    Referring MD: Justin Young*    Chief Complaint: Consult for Afib ablation   History of Present Illness:     Justin Young is a 61 y.o. male who presents for an evaluation for atrial fibrillation ablation at the request of Justin Young, [place]. Their medical history includes paroxysmal atrial fibrillation, hypertension, arthritis, GERD, OSA, and L4-5 spinal stenosis.   Mr. Moskwa last saw Justin Young, [place] on 02/08/2021. He reported worsening blurry vision and dizziness on flecainide. He was also scheduled to pick up his CPAP soon for severe OSA. Flecainide was reduced to 50 mg BID, and Lopressor was decreased to 12.5 mg BID. He was referred to EP as he was interested in rhythm control without medications.   Previously seen by Dr. Johney Young 09/28/20, started on flecainide.   Overall, he appears well. His atrial fibrillation started suddenly about a year ago. He complains of headaches that he attributes to taking flecainide.   When in Afib, he generally feels his "heart fluttering around" along with discomfort/pain in his chest. He also feels fatigued. His longest episode was his initial episode, lasting 12 hours. After a few months the episodes improved on medication. Lately his episodes may last for a minute or two, about 1-2 times a week. However, he states the episodes are still disruptive, and stop him in his tracks.   He has been diagnosed with sleep apnea, but he is not wearing a CPAP as it is difficult for him to wear. He has been trying to pursue an Inspire device.   He denies any peripheral edema. No lightheadedness, syncope, or PND.   In his family, his father also had Afib, died of a stroke.    Plan for Pvi today.     Objective      Past Medical History:  Diagnosis Date   Arthritis     GERD (gastroesophageal reflux disease)      mild -rolaids if necessary   Hypertension     Insomnia     Paroxysmal atrial fibrillation (HCC)     Spinal stenosis at L4-L5 level     Syncope 2020           Past Surgical History:  Procedure Laterality Date   ANKLE RECONSTRUCTION   left    3 procedures total   CLAVICLE SURGERY        bilateral   KNEE ARTHROSCOPY   2006-most current    left knee X 2 1 ON RIGHT   KNEE ARTHROSCOPY WITH MEDIAL MENISECTOMY   01/27/2012    Procedure: KNEE ARTHROSCOPY WITH MEDIAL MENISECTOMY;  Surgeon: Jacki Cones, MD;  Location: [facility];  Service: Orthopedics;  Laterality: Left;   LUMBAR LAMINECTOMY/DECOMPRESSION MICRODISCECTOMY N/A 03/16/2016    Procedure: CENTRAL DECOMPRESSIVE LUMBAR LAMINECTOMY LUMBAR FOUR TO LUMBAR FIVE;  Surgeon: Justin Gosselin, MD;  Location: [facility];  Service: Orthopedics;  Laterality: N/A;   TONSILLECTOMY   AS CHILD      Current Medications: Active Medications      Current Meds  Medication Sig   acetaminophen (TYLENOL 8 HOUR) 650 MG CR tablet Take 1 tablet (650 mg total) by mouth every 8 (eight) hours as needed.  apixaban (ELIQUIS) 5 MG TABS tablet Take 1 tablet (5 mg total) by mouth 2 (two) times daily.   clonazePAM (KLONOPIN) 0.5 MG tablet Take 0.5 mg by mouth at bedtime as needed for sleep.   flecainide (TAMBOCOR) 100 MG tablet Take 0.5 tablets (50 mg total) by mouth 2 (two) times daily.   loratadine (CLARITIN) 10 MG tablet Take 10 mg by mouth daily.   losartan (COZAAR) 25 MG tablet TAKE 1 TABLET (25 MG TOTAL) BY MOUTH DAILY.   metoprolol tartrate (LOPRESSOR) 25 MG tablet Take 0.5 tablets (12.5 mg total) by mouth 2 (two) times daily.   omeprazole (PRILOSEC) 20 MG capsule Take 20 mg by mouth daily at 6 PM.        Allergies:   Amlodipine, Sulfa antibiotics, and Codeine    Social History          Socioeconomic History   Marital status: Married      Spouse name: Neoma Laming   Number of children: 3   Years of education: 18   Highest education level: Not on file  Occupational History   Occupation: Government social research officer: Reading: self-employed  Tobacco Use   Smoking status: Former      Packs/day: 3.00      Years: 30.00      Pack years: 90.00      Types: Cigarettes      Quit date: 01/25/2007      Years since quitting: 14.1   Smokeless tobacco: Never   Tobacco comments:      Former smoker 11/02/2020  Vaping Use   Vaping Use: Never used  Substance and Sexual Activity   Alcohol use: Not Currently      Alcohol/week: 7.0 standard drinks      Types: 7 Shots of liquor per week      Comment: Stop drinking 6 days ago(couple vodka drinks per day) 02/08/21   Drug use: No   Sexual activity: Yes      Partners: Female  Other Topics Concern   Not on file  Social History Narrative    [employer], [employer] - BS [place], [place] business classes.Married '84 wife Neoma Laming: 3 kids - g b g: '86, '88, '95. 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[employer], Occupational psychologist at [employer], Archivist.Regular exercise: activeColonoscopy 7 yrs ago         Owns Glass blower/designer company    Social Determinants of Adult nurse Strain: Not on file  Food Insecurity: Not on file  Transportation Needs: Not on file  Physical Activity: Not on file  Stress: Not on file  Social Connections: Not on file      Family History: The patient's family history includes Alcohol abuse in his father; Arthritis in his maternal grandmother and mother; Cancer in his mother; Colon cancer in his mother; Diabetes in his mother; Heart disease in his father and sister; Hyperlipidemia in his father; Hypertension in his father and sister; Sleep apnea in his sister.   ROS:   Please see the history of present illness.    (+) Headaches (+) Palpitations (+) Chest pain (+)  Fatigue (+) Sleep apnea All other systems reviewed and are negative.   EKGs/Labs/Other Studies Reviewed:     The following studies were reviewed today:   Echo 07/29/2020:  1. Left ventricular ejection fraction, by estimation, is 55 to 60%. Left  ventricular ejection fraction by 3D volume is  59 %. The left ventricle has  normal function. The left ventricle has no regional wall motion  abnormalities. There is mild asymmetric  left ventricular hypertrophy of the basal-septal segment. Left ventricular  diastolic parameters are consistent with Grade I diastolic dysfunction  (impaired relaxation). The average left ventricular global longitudinal  strain is -24.6 %. The global  longitudinal strain is normal.   2. Right ventricular systolic function is normal. The right ventricular  size is normal. Tricuspid regurgitation signal is inadequate for assessing  PA pressure.   3. The mitral valve is normal in structure. Trivial mitral valve  regurgitation. No evidence of mitral stenosis.   4. The aortic valve is tricuspid. There is mild calcification of the  aortic valve. There is mild thickening of the aortic valve. Aortic valve  regurgitation is not visualized. Mild aortic valve sclerosis is present,  with no evidence of aortic valve  stenosis.   5. Aortic dilatation noted. There is mild dilatation of the aortic root,  measuring 39 mm. There is mild dilatation of the ascending aorta,  measuring 39 mm.   6. The inferior vena cava is normal in size with greater than 50%  respiratory variability, suggesting right atrial pressure of 3 mmHg.   Comparison(s): No significant change from prior study. 06/05/17 EF 55-60%.    EKG:   EKG is personally reviewed.  03/23/2021: Sinus rhythm.  PR interval 182 ms.  QRS duration 116 ms.  Incomplete right bundle branch block.     Recent Labs: 07/02/2020: BUN 16; Creatinine, Ser 0.81; Hemoglobin 15.6; Magnesium 1.9; Platelets 205; Potassium 5.6; Sodium 137     Recent Lipid Panel Labs (Brief)          Component Value Date/Time    CHOL 179 09/09/2014 1150    TRIG 191.0 (H) 09/09/2014 1150    HDL 40.10 09/09/2014 1150    CHOLHDL 4 09/09/2014 1150    VLDL 38.2 09/09/2014 1150    LDLCALC 100 (H) 09/09/2014 1150    LDLDIRECT 134.8 03/07/2012 1551        Physical Exam:     VS:  BP 174/89   Pulse 60   Ht 5\' 10"  (1.778 m)   Wt 222 lb 3.2 oz (100.8 kg)   SpO2 96%   BMI 31.88 kg/m         Wt Readings from Last 3 Encounters:  03/23/21 222 lb 3.2 oz (100.8 kg)  02/08/21 222 lb 9.6 oz (101 kg)  11/30/20 215 lb (97.5 kg)      GEN: Well nourished, well developed in no acute distress HEENT: Normal NECK: No JVD; No carotid bruits LYMPHATICS: No lymphadenopathy CARDIAC: RRR, no murmurs, rubs, gallops RESPIRATORY:  Clear to auscultation without rales, wheezing or rhonchi  ABDOMEN: Soft, non-tender, non-distended MUSCULOSKELETAL:  No edema; No deformity  SKIN: Warm and dry NEUROLOGIC:  Alert and oriented x 3 PSYCHIATRIC:  Normal affect          Assessment ASSESSMENT:     1. Paroxysmal atrial fibrillation (HCC)   2. Primary hypertension   3. OSA (obstructive sleep apnea)   4. Pre-op evaluation     PLAN:     In order of problems listed above:   #Paroxysmal atrial fibrillation Symptomatic.  Occurring despite treatment with flecainide.  On Eliquis for stroke prophylaxis.  I discussed treatment options for his atrial fibrillation including antiarrhythmic drug therapy and catheter ablation.  He would like to proceed with catheter ablation.  I discussed the procedure in detail  during today's visit including the risks, recovery and likelihood of success.  I discussed the potential need for future ablation procedures or antiarrhythmic drug after our initial ablation.   Risk, benefits, and alternatives to EP study and radiofrequency ablation for afib were also discussed in detail today. These risks include but are not limited to stroke,  bleeding, vascular damage, tamponade, perforation, damage to the esophagus, lungs, and other structures, pulmonary vein stenosis, worsening renal function, and death. The patient understands these risk and wishes to proceed.  We will therefore proceed with catheter ablation at the next available time.  Carto, ICE, anesthesia are requested for the procedure.  Will also obtain CT PV protocol prior to the procedure to exclude LAA thrombus and further evaluate atrial anatomy.   #Hypertension Controlled Continue diltiazem, losartan, metoprolol.  #Obstructive sleep apnea Has not tolerated CPAP in the past.  I am going to refer him to one of my colleagues who is a specialist in sleep medicine to discuss the inspire device.  Plan for PVI today.  Signed, Hilton Cork. Quentin Ore, MD, [facility], [facility] 06/29/2021 Electrophysiology [facility]

## 2021-06-29 NOTE — Progress Notes (Signed)
  Echocardiogram Echocardiogram Transesophageal has been performed.  Justin Young 06/29/2021, 1:32 PM

## 2021-06-29 NOTE — Transfer of Care (Signed)
Immediate Anesthesia Transfer of Care Note  Patient: Justin Young  Procedure(s) Performed: ATRIAL FIBRILLATION ABLATION TRANSESOPHAGEAL ECHOCARDIOGRAM (TEE)  Patient Location: PACU and Cath Lab  Anesthesia Type:General  Level of Consciousness: awake, alert  and oriented  Airway & Oxygen Therapy: Patient Spontanous Breathing and Patient connected to nasal cannula oxygen  Post-op Assessment: Report given to RN and Post -op Vital signs reviewed and stable  Post vital signs: Reviewed and stable  Last Vitals:  Vitals Value Taken Time  BP 157/90   Temp    Pulse 66 06/29/21 1333  Resp 12 06/29/21 1333  SpO2 94 % 06/29/21 1333  Vitals shown include unvalidated device data.  Last Pain:  Vitals:   06/29/21 0929  TempSrc:   PainSc: 0-No pain         Complications: No notable events documented.

## 2021-06-29 NOTE — Progress Notes (Signed)
    Transesophageal Echocardiogram Note  Justin Young CQ:[id number] 04-08-1960  Procedure: Transesophageal Echocardiogram Indications: R/O aortic dissection  Procedure Details  Called to see patient in the catheterization laboratory during atrial fibrillation ablation.  Small aortic flap was questioned on ICE.  Patient was already sedated and intubated at time of evaluation.  Transesophageal probe passed without difficulty.  Limited study to assess aorta.  Aortic valve noted to be trileaflet.  Aorta did not appear to be dilated.  Examination of the ascending aorta, aortic arch and descending aorta did not show aortic dissection.   Complications: No apparent complications Patient did tolerate procedure well.  Kirk Ruths, MD

## 2021-06-30 ENCOUNTER — Encounter (HOSPITAL_COMMUNITY): Payer: Self-pay | Admitting: Cardiology

## 2021-07-02 ENCOUNTER — Telehealth: Payer: Self-pay

## 2021-07-02 NOTE — Telephone Encounter (Signed)
**Note De-Identified Nihal Marzella Obfuscation** Pantoprazole PA started through covermymeds.  Jcion Ndiaye Key: [id number] - Rx #: [id number] Outcome: Approved today Effective from 07/02/2021 through 07/01/2022. Drug: Pantoprazole Sodium 40MG  dr tablets Form: Blue Form (CB)  I have notified [id number] [place],  - [address] (Ph: [phone]) of this approval.

## 2021-07-14 ENCOUNTER — Ambulatory Visit (HOSPITAL_COMMUNITY)
Admission: RE | Admit: 2021-07-14 | Discharge: 2021-07-14 | Disposition: A | Discharge status: Home / Self Care | Payer: BC Managed Care – PPO | Source: Ambulatory Visit | Attending: Nurse Practitioner | Admitting: Nurse Practitioner

## 2021-07-14 ENCOUNTER — Encounter (HOSPITAL_COMMUNITY): Payer: Self-pay | Admitting: Nurse Practitioner

## 2021-07-14 ENCOUNTER — Other Ambulatory Visit (HOSPITAL_COMMUNITY): Payer: Self-pay | Admitting: Nurse Practitioner

## 2021-07-14 ENCOUNTER — Telehealth (HOSPITAL_COMMUNITY): Payer: Self-pay | Admitting: *Deleted

## 2021-07-14 VITALS — BP 176/100 | HR 57 | Ht 70.0 in | Wt 223.2 lb

## 2021-07-14 DIAGNOSIS — I480 Paroxysmal atrial fibrillation: Secondary | ICD-10-CM | POA: Diagnosis present

## 2021-07-14 DIAGNOSIS — R001 Bradycardia, unspecified: Secondary | ICD-10-CM | POA: Insufficient documentation

## 2021-07-14 DIAGNOSIS — G4733 Obstructive sleep apnea (adult) (pediatric): Secondary | ICD-10-CM | POA: Diagnosis not present

## 2021-07-14 DIAGNOSIS — Z7901 Long term (current) use of anticoagulants: Secondary | ICD-10-CM | POA: Insufficient documentation

## 2021-07-14 DIAGNOSIS — I10 Essential (primary) hypertension: Secondary | ICD-10-CM

## 2021-07-14 DIAGNOSIS — J439 Emphysema, unspecified: Secondary | ICD-10-CM | POA: Insufficient documentation

## 2021-07-14 DIAGNOSIS — Z79899 Other long term (current) drug therapy: Secondary | ICD-10-CM | POA: Diagnosis not present

## 2021-07-14 DIAGNOSIS — D6869 Other thrombophilia: Secondary | ICD-10-CM | POA: Diagnosis not present

## 2021-07-14 DIAGNOSIS — R079 Chest pain, unspecified: Secondary | ICD-10-CM

## 2021-07-14 DIAGNOSIS — I4510 Unspecified right bundle-branch block: Secondary | ICD-10-CM | POA: Diagnosis not present

## 2021-07-14 MED ORDER — LOSARTAN POTASSIUM 25 MG PO TABS: 50.0000 mg | ORAL_TABLET | Freq: Every day | ORAL | 3 refills | Status: DC

## 2021-07-14 NOTE — Progress Notes (Signed)
Primary Care Physician: [facility]  Referring Physician: Dr. Casandra Doffing is a 61 y.o. male with a h/o HTN, alcohol use, OSA,untreated,  that had an ablation 6/13 and drove to  [employer] 6/16 over 2 days. He returned this past Saturday. He reports that he had chest discomfort a couple of days after the ablation and took colchicine and it resolved. Over the last few mornings he has woke up with chest burning in the center of his chest that will resolve after around 2 hours. No swallowing issues. Bruising at catheter sites after procedure but resolved . He also noted this discomfort with unloading suitcases Saturday with some radiation ot left arm. In the past he has noted some discomfort with activity in his left arm. He is pain free at time of clinic visit. He does drink 3 vodka drinks at night and while on vacation may have consumed more than usual. His insurance did not approve his Protonix for ablation so he did not take this. He is on Prilosec daily.  His BP is elevated and per pt this has been elevated a lot recently. He had swelling with amlodipine in the past. On low dose losartan. He remains on flecainide and metoprolol. No afib to report.     Today, he denies symptoms of palpitations, chest pain, shortness of breath, orthopnea, PND, lower extremity edema, dizziness, presyncope, syncope, or neurologic sequela. The patient is tolerating medications without difficulties and is otherwise without complaint today.   Past Medical History:  Diagnosis Date   Arthritis    GERD (gastroesophageal reflux disease)    mild -rolaids if necessary   Hypertension    Insomnia    Paroxysmal atrial fibrillation (HCC)    Spinal stenosis at L4-L5 level    Syncope 2020   Past Surgical History:  Procedure Laterality Date   ANKLE RECONSTRUCTION  left   3 procedures total   ATRIAL FIBRILLATION ABLATION N/A 06/29/2021   Procedure: ATRIAL FIBRILLATION ABLATION;   Surgeon: Lanier Prude, MD;  Location: [facility];  Service: Cardiovascular;  Laterality: N/A;   CLAVICLE SURGERY     bilateral   KNEE ARTHROSCOPY  2006-most current   left knee X 2 1 ON RIGHT   KNEE ARTHROSCOPY WITH MEDIAL MENISECTOMY  01/27/2012   Procedure: KNEE ARTHROSCOPY WITH MEDIAL MENISECTOMY;  Surgeon: Jacki Cones, MD;  Location: [facility] [place];  Service: Orthopedics;  Laterality: Left;   LUMBAR LAMINECTOMY/DECOMPRESSION MICRODISCECTOMY N/A 03/16/2016   Procedure: CENTRAL DECOMPRESSIVE LUMBAR LAMINECTOMY LUMBAR FOUR TO LUMBAR FIVE;  Surgeon: Ranee Gosselin, MD;  Location: [facility];  Service: Orthopedics;  Laterality: N/A;   TEE WITHOUT CARDIOVERSION  06/29/2021   Procedure: TRANSESOPHAGEAL ECHOCARDIOGRAM (TEE);  Surgeon: Lanier Prude, MD;  Location: [facility] INVASIVE CV LAB;  Service: Cardiovascular;;   TONSILLECTOMY  AS CHILD    Current Outpatient Medications  Medication Sig Dispense Refill   acetaminophen (TYLENOL 8 HOUR) 650 MG CR tablet Take 1 tablet (650 mg total) by mouth every 8 (eight) hours as needed. 30 tablet 0   apixaban (ELIQUIS) 5 MG TABS tablet Take 1 tablet (5 mg total) by mouth 2 (two) times daily. 180 tablet 3   clonazePAM (KLONOPIN) 0.5 MG tablet Take 0.5 mg by mouth at bedtime.     flecainide (TAMBOCOR) 100 MG tablet Take 0.5 tablets (50 mg total) by mouth 2 (two) times daily. 60 tablet 3   metoprolol tartrate (LOPRESSOR) 25 MG tablet  Take 0.5 tablets (12.5 mg total) by mouth 2 (two) times daily.     omeprazole (PRILOSEC) 20 MG capsule Take 20 mg by mouth 2 (two) times daily.     losartan (COZAAR) 25 MG tablet Take 2 tablets (50 mg total) by mouth daily. 90 tablet 3   pantoprazole (PROTONIX) 40 MG tablet Take 1 tablet (40 mg total) by mouth daily. (Patient not taking: Reported on 07/14/2021) 45 tablet 0   No current facility-administered medications for this encounter.    Allergies  Allergen Reactions   Amlodipine Swelling and Other  (See Comments)    Water retention   Sulfa Antibiotics Other (See Comments)    From when younger.   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  Tobacco Use   Smoking status: Former    Packs/day: 3.00    Years: 30.00    Total pack years: 90.00    Types: Cigarettes    Quit date: 01/25/2007    Years since quitting: 14.4   Smokeless tobacco: Never   Tobacco comments:    Former smoker 11/02/2020  Vaping Use   Vaping Use: Never used  Substance and Sexual Activity   Alcohol use: Not Currently    Alcohol/week: 7.0 standard drinks of alcohol    Types: 7 Shots of liquor per week    Comment: Stop drinking 6 days ago(couple vodka drinks per day) 02/08/21   Drug use: No   Sexual activity: Yes    Partners: Female  Other Topics Concern   Not on file  Social History Narrative   HSG, [employer] - BS [place], [employer] -misc business classes.Married '84 wife Gavin Pound: 3 kids - g b g: '86, '88, '95. 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   Diabetes Mother    Colon cancer Mother    Alcohol abuse Father    Hyperlipidemia Father    Hypertension Father    Heart disease Father    Heart disease  Sister    Hypertension Sister    Sleep apnea Sister    Arthritis Maternal Grandmother     ROS- All systems are reviewed and negative except as per the HPI above  Physical Exam: Vitals:   07/14/21 0956  BP: (!) 176/100  Pulse: (!) 57  Weight: 101.2 kg  Height: 5\' 10"  (1.778 m)   Wt Readings from Last 3 Encounters:  07/14/21 101.2 kg  06/29/21 97.1 kg  03/23/21 100.8 kg    Labs: Lab Results  Component Value Date   NA 139 06/07/2021   K 5.0 06/07/2021   CL 102 06/07/2021  CO2 26 06/07/2021   GLUCOSE 115 (H) 06/07/2021   BUN 19 06/07/2021   CREATININE 0.77 06/07/2021   CALCIUM 9.7 06/07/2021   MG 1.9 07/02/2020   Lab Results  Component Value Date   INR 0.96 01/06/2017   Lab Results  Component Value Date   CHOL 179 09/09/2014   HDL 40.10 09/09/2014   LDLCALC 100 (H) 09/09/2014   TRIG 191.0 (H) 09/09/2014     GEN- The patient is well appearing, alert and oriented x 3 today.   Head- normocephalic, atraumatic Eyes-  Sclera clear, conjunctiva pink Ears- hearing intact Oropharynx- clear Neck- supple, no JVP Lymph- no cervical lymphadenopathy Lungs- Clear to ausculation bilaterally, normal work of breathing Heart- Regular rate and rhythm, no murmurs, rubs or gallops, PMI not laterally displaced GI- soft, NT, ND, + BS Extremities- no clubbing, cyanosis, or edema MS- no significant deformity or atrophy Skin- no rash or lesion Psych- euthymic mood, full affect Neuro- strength and sensation are intact  Echo TEE-06/29/21-  1. The aortic valve is normal in structure. Aortic valve regurgitation is  not visualized.   2. There is mild (Grade II) plaque involving the descending aorta.   3. Limited study to R/O aortic dissection during atrial fibrillation  ablation; no dissection noted; trileaflet aortic valve with no AI.   Conclusion(s)/Recommendation(s): Normal biventricular function without  evidence of hemodynamically significant valvular heart  disease.  Over-read interpretation of CT chest- IMPRESSION: 1. Dilated central pulmonary arteries are consistent with some degree of underlying pulmonary hypertension. 2. Moderate emphysematous lung disease. 3. Possible cirrhosis of the liver   CT morphology pulmonary veins  w/o CA score  1. There is normal pulmonary vein drainage into the left atrium.   2. The left atrial appendage is large - mixed chicken wing / broccoli type with two lobes and ostial size 28.6 x 22.2 mm and length 35 mm. There is no thrombus in the left atrial appendage.   3. The esophagus runs in the left atrial midline and is not in the proximity to any of the pulmonary veins.   4. Coronary calcium score 48.2. This is 62nd percentile for age and gender.    Chilton Si, MD   EKG- Vent. rate 57 BPM PR interval 182 ms QRS duration 114 ms QT/QTcB 424/412 ms P-R-T axes 32 71 59 Sinus bradycardia with Premature atrial complexes Incomplete right bundle branch block Borderline ECG When compared with ECG of 29-Jun-2021 17:23, PREVIOUS ECG IS PRESENT     Assessment and Plan:  1. Afib  S/p ablation 6/13 Maintaining  SR Continue  flecainide 50 mg bid and metoprolol 12.5 mg bid   2. Chest discomfort  Describes as mid chest burning that usually will sometimes involve left arm , mostly present first thing on awakening in the am , lasting for a few hours Felt this as well unloading suitcases this past Saturday with radiation to left  arm  He is concerned for angina Will order a lexi Myoview,  recent calcium core of 48  I am  concerned re reflux with moderate nightly alcohol use with recent ablation  with pain first thing in the am that resolves after a couple of hours  I have asked him to increase Protonix to bid  He will avoid alcohol   3. HTN Poorly controlled  Did not tolerate amlodipine in the past for swelling of LE's I will increase losartan to 50 mg daily  Avoid salt in the diet   4.  CHA2DS2VASc  score of 1 Continue  eliquis 5 mg bid   F/u in the office as planned with Jorja Loa, PA, 7/11 Notify afib office  in a couple of days if he does not see improvement   Lupita Leash C. [facility] [address] [place], [place] [address] [phone]

## 2021-07-14 NOTE — Patient Instructions (Signed)
Increase prilosec to 20mg  twice a day  Increase losartan to 50mg  daily

## 2021-07-14 NOTE — Telephone Encounter (Signed)
Left message on voicemail per DPR in reference to upcoming appointment scheduled on 07/16/2021 at 7:45 with detailed instructions given per Myocardial Perfusion Study Information Sheet for the test. LM to arrive 15 minutes early, and that it is imperative to arrive on time for appointment to keep from having the test rescheduled. If you need to cancel or reschedule your appointment, please call the office within 24 hours of your appointment. Failure to do so may result in a cancellation of your appointment, and a $50 no show fee. Phone number given for call back for any questions.

## 2021-07-14 NOTE — Addendum Note (Signed)
Encounter addended by: Shona Simpson, RN on: 07/14/2021 11:48 AM  Actions taken: Order list changed, Diagnosis association updated

## 2021-07-16 ENCOUNTER — Other Ambulatory Visit: Payer: Self-pay | Admitting: Internal Medicine

## 2021-07-16 ENCOUNTER — Ambulatory Visit (HOSPITAL_COMMUNITY): Payer: BC Managed Care – PPO | Attending: Internal Medicine

## 2021-07-16 ENCOUNTER — Other Ambulatory Visit (HOSPITAL_BASED_OUTPATIENT_CLINIC_OR_DEPARTMENT_OTHER): Payer: Self-pay | Admitting: Family

## 2021-07-16 DIAGNOSIS — I10 Essential (primary) hypertension: Secondary | ICD-10-CM

## 2021-07-16 DIAGNOSIS — I480 Paroxysmal atrial fibrillation: Secondary | ICD-10-CM | POA: Diagnosis not present

## 2021-07-16 LAB — MYOCARDIAL PERFUSION IMAGING
LV dias vol: 155 mL (ref 62–150)
LV sys vol: 69 mL
Nuc Stress EF: 56 %
Peak HR: 82 {beats}/min
Rest HR: 53 {beats}/min
Rest Nuclear Isotope Dose: 9.7 mCi
SDS: 0
SRS: 0
SSS: 0
ST Depression (mm): 0 mm
Stress Nuclear Isotope Dose: 31.4 mCi
TID: 1.05

## 2021-07-16 MED ORDER — REGADENOSON 0.4 MG/5ML IV SOLN
0.4000 mg | Freq: Once | INTRAVENOUS | Status: AC
  Administered 2021-07-16: 0.4 mg via INTRAVENOUS

## 2021-07-16 MED ORDER — TECHNETIUM TC 99M TETROFOSMIN IV KIT
9.7000 | PACK | Freq: Once | INTRAVENOUS | Status: AC | PRN
  Administered 2021-07-16: 9.7 via INTRAVENOUS

## 2021-07-16 MED ORDER — TECHNETIUM TC 99M TETROFOSMIN IV KIT
31.4000 | PACK | Freq: Once | INTRAVENOUS | Status: AC | PRN
  Administered 2021-07-16: 31.4 via INTRAVENOUS

## 2021-07-21 ENCOUNTER — Encounter (HOSPITAL_COMMUNITY): Payer: Self-pay | Admitting: *Deleted

## 2021-07-27 ENCOUNTER — Ambulatory Visit (HOSPITAL_COMMUNITY): Payer: BC Managed Care – PPO | Admitting: Physician Assistant

## 2021-08-17 ENCOUNTER — Ambulatory Visit (HOSPITAL_COMMUNITY)
Admission: RE | Admit: 2021-08-17 | Discharge: 2021-08-17 | Disposition: A | Discharge status: Home / Self Care | Payer: BC Managed Care – PPO | Source: Ambulatory Visit | Attending: Physician Assistant | Admitting: Physician Assistant

## 2021-08-17 ENCOUNTER — Ambulatory Visit (HOSPITAL_COMMUNITY): Payer: BC Managed Care – PPO | Admitting: Physician Assistant

## 2021-08-17 VITALS — BP 136/82 | HR 61 | Ht 70.0 in | Wt 222.2 lb

## 2021-08-17 DIAGNOSIS — R0789 Other chest pain: Secondary | ICD-10-CM | POA: Diagnosis not present

## 2021-08-17 DIAGNOSIS — I480 Paroxysmal atrial fibrillation: Secondary | ICD-10-CM | POA: Insufficient documentation

## 2021-08-17 DIAGNOSIS — D6869 Other thrombophilia: Secondary | ICD-10-CM

## 2021-08-17 DIAGNOSIS — Z79899 Other long term (current) drug therapy: Secondary | ICD-10-CM | POA: Insufficient documentation

## 2021-08-17 DIAGNOSIS — Z7901 Long term (current) use of anticoagulants: Secondary | ICD-10-CM | POA: Diagnosis not present

## 2021-08-17 DIAGNOSIS — I10 Essential (primary) hypertension: Secondary | ICD-10-CM | POA: Insufficient documentation

## 2021-08-17 NOTE — Progress Notes (Signed)
Primary Care Physician: [facility]  Referring Physician: Dr. Casandra Doffing is a 61 y.o. male with a h/o HTN, alcohol use, OSA,untreated,  that had an ablation 6/13 and drove to  [employer] 6/16 over 2 days. He returned this past Saturday. He reports that he had chest discomfort a couple of days after the ablation and took colchicine and it resolved. Over the last few mornings he has woke up with chest burning in the center of his chest that will resolve after around 2 hours. No swallowing issues. Bruising at catheter sites after procedure but resolved . He also noted this discomfort with unloading suitcases Saturday with some radiation ot left arm. In the past he has noted some discomfort with activity in his left arm. He is pain free at time of clinic visit. He does drink 3 vodka drinks at night and while on vacation may have consumed more than usual. His insurance did not approve his Protonix for ablation so he did not take this. He is on Prilosec daily.  His BP is elevated and per pt this has been elevated a lot recently. He had swelling with amlodipine in the past. On low dose losartan. He remains on flecainide and metoprolol. No afib to report.   F/u in afib clinic, 08/17/21. On last visit, with his chest intermittent chest burning, I ordered a stress test, which was low risk. I increased his omeprazole to bid and advised to stop alcohol. He had recently been on vacation and had more alcohol than usual.   Today, the chest burning has improved, but not completely resolved. He will usually notice sometime in the am, lasting a few minutes up to an hour, not exertional. He has not tried any Tums or other antacids at the time of the burning. It is not tied to exertion. He had cut back on alcohol but still drinks about 3 drinks in the pm about 3 times a week. He states that although he has had indigestion in the past a couple of years ago when  he was started on  omeprazole, he has not had any discomfort like this until after ablation. He is in SR and has not noted any afib.   Today, he denies symptoms of palpitations, chest pain, shortness of breath, orthopnea, PND, lower extremity edema, dizziness, presyncope, syncope, or neurologic sequela.+ for intermittent mid chest radiating into throat burning.  The patient is tolerating medications without difficulties and is otherwise without complaint today.   Past Medical History:  Diagnosis Date   Arthritis    GERD (gastroesophageal reflux disease)    mild -rolaids if necessary   Hypertension    Insomnia    Paroxysmal atrial fibrillation (HCC)    Spinal stenosis at L4-L5 level    Syncope 2020   Past Surgical History:  Procedure Laterality Date   ANKLE RECONSTRUCTION  left   3 procedures total   ATRIAL FIBRILLATION ABLATION N/A 06/29/2021   Procedure: ATRIAL FIBRILLATION ABLATION;  Surgeon: Lanier Prude, MD;  Location: [facility];  Service: Cardiovascular;  Laterality: N/A;   CLAVICLE SURGERY     bilateral   KNEE ARTHROSCOPY  2006-most current   left knee X 2 1 ON RIGHT   KNEE ARTHROSCOPY WITH MEDIAL MENISECTOMY  01/27/2012   Procedure: KNEE ARTHROSCOPY WITH MEDIAL MENISECTOMY;  Surgeon: Jacki Cones, MD;  Location: [facility];  Service: Orthopedics;  Laterality: Left;   LUMBAR LAMINECTOMY/DECOMPRESSION MICRODISCECTOMY  N/A 03/16/2016   Procedure: CENTRAL DECOMPRESSIVE LUMBAR LAMINECTOMY LUMBAR FOUR TO LUMBAR FIVE;  Surgeon: Ranee Gosselin, MD;  Location: [facility];  Service: Orthopedics;  Laterality: N/A;   TEE WITHOUT CARDIOVERSION  06/29/2021   Procedure: TRANSESOPHAGEAL ECHOCARDIOGRAM (TEE);  Surgeon: Lanier Prude, MD;  Location: [facility] INVASIVE CV LAB;  Service: Cardiovascular;;   TONSILLECTOMY  AS CHILD    Current Outpatient Medications  Medication Sig Dispense Refill   acetaminophen (TYLENOL 8 HOUR) 650 MG CR tablet Take 1 tablet (650 mg total) by mouth every 8  (eight) hours as needed. 30 tablet 0   apixaban (ELIQUIS) 5 MG TABS tablet Take 1 tablet (5 mg total) by mouth 2 (two) times daily. 180 tablet 3   clonazePAM (KLONOPIN) 0.5 MG tablet Take 0.5 mg by mouth at bedtime.     flecainide (TAMBOCOR) 100 MG tablet Take 0.5 tablets (50 mg total) by mouth 2 (two) times daily. 60 tablet 3   losartan (COZAAR) 25 MG tablet Take 2 tablets (50 mg total) by mouth daily. 90 tablet 3   metoprolol tartrate (LOPRESSOR) 25 MG tablet Take 0.5 tablets (12.5 mg total) by mouth 2 (two) times daily.     omeprazole (PRILOSEC) 20 MG capsule Take 20 mg by mouth 2 (two) times daily.     pantoprazole (PROTONIX) 40 MG tablet Take 1 tablet (40 mg total) by mouth daily. (Patient not taking: Reported on 07/14/2021) 45 tablet 0   No current facility-administered medications for this encounter.    Allergies  Allergen Reactions   Amlodipine Swelling and Other (See Comments)    Water retention   Sulfa Antibiotics Other (See Comments)    From when younger.   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  Tobacco Use   Smoking status: Former    Packs/day: 3.00    Years: 30.00    Total pack years: 90.00    Types: Cigarettes    Quit date: 01/25/2007    Years since quitting: 14.5   Smokeless tobacco: Never   Tobacco comments:    Former smoker 11/02/2020  Vaping Use   Vaping Use: Never used  Substance and Sexual Activity   Alcohol use: Not Currently    Alcohol/week: 7.0 standard drinks of alcohol    Types: 7 Shots of liquor per week    Comment: Stop drinking 6 days ago(couple vodka drinks per day) 02/08/21   Drug use: No   Sexual activity: Yes    Partners: Female  Other Topics Concern   Not on file   Social History Narrative   HSG, [employer] - BS [place], [employer] -misc business classes.Married '84 wife Gavin Pound: 3 kids - g b g: '86, '88, '95. 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   Diabetes Mother    Colon cancer Mother    Alcohol abuse Father    Hyperlipidemia Father    Hypertension Father    Heart disease Father    Heart disease Sister    Hypertension Sister    Sleep apnea Sister    Arthritis Maternal Grandmother     ROS- All systems are reviewed and negative except as per the HPI above  Physical Exam: Vitals:   08/17/21 0918  BP: 136/82  Pulse: 61  Weight: 100.8 kg  Height: 5\' 10"  (1.778 m)   Wt Readings from Last 3 Encounters:  08/17/21 100.8 kg  07/16/21 101.2 kg  07/14/21 101.2 kg    Labs: Lab Results  Component Value Date   NA 139 06/07/2021   K 5.0 06/07/2021   CL 102 06/07/2021   CO2 26 06/07/2021   GLUCOSE 115 (H) 06/07/2021   BUN 19 06/07/2021   CREATININE 0.77 06/07/2021   CALCIUM 9.7 06/07/2021   MG 1.9 07/02/2020   Lab Results  Component Value Date   INR 0.96 01/06/2017   Lab Results  Component Value Date   CHOL 179 09/09/2014   HDL 40.10 09/09/2014   LDLCALC 100 (H) 09/09/2014   TRIG 191.0 (H) 09/09/2014     GEN- The patient is well appearing, alert and oriented x 3 today.   Head- normocephalic, atraumatic Eyes-  Sclera clear, conjunctiva pink Ears- hearing intact Oropharynx- clear Neck-  supple, no JVP Lymph- no cervical lymphadenopathy Lungs- Clear to ausculation bilaterally, normal work of breathing Heart- Regular rate and rhythm, no murmurs, rubs or gallops, PMI not laterally displaced GI- soft, NT, ND, + BS Extremities- no clubbing, cyanosis, or edema MS- no significant deformity or atrophy Skin- no rash or lesion Psych- euthymic mood, full affect Neuro- strength and sensation are intact  Echo TEE-06/29/21-  1. The aortic valve is normal in structure. Aortic valve regurgitation is  not visualized.   2. There is mild (Grade II) plaque involving the descending aorta.   3. Limited study to R/O aortic dissection during atrial fibrillation  ablation; no dissection noted; trileaflet aortic valve with no AI.   Conclusion(s)/Recommendation(s): Normal biventricular function without  evidence of hemodynamically significant valvular heart disease.  Over-read interpretation of CT chest- IMPRESSION: 1. Dilated central pulmonary arteries are consistent with some degree of underlying pulmonary hypertension. 2. Moderate emphysematous lung disease. 3. Possible cirrhosis of the liver   CT morphology pulmonary veins  w/o CA score  1. There is normal pulmonary vein drainage into the left atrium.   2. The left atrial appendage is large - mixed chicken wing / broccoli type with two lobes and ostial size 28.6 x 22.2 mm and length 35 mm. There is no thrombus in the left atrial appendage.   3. The esophagus runs in the left atrial midline and is not in the proximity to any of the pulmonary veins.   4. Coronary calcium score 48.2. This is 62nd percentile for age and gender.    07/01/21, MD  Chilton Si-   The study is normal. The study is low risk.   No ST deviation was noted.   LV perfusion is normal. There is no evidence of ischemia. There is no evidence of infarction.   Left ventricular function is normal. Nuclear stress EF: 56 %. The left ventricular ejection  fraction is normal (55-65%). End diastolic cavity size is mildly enlarged.   Prior study not available for comparison.   EKG- Vent. rate 57 BPM PR interval 182 ms QRS duration 114 ms QT/QTcB 424/412 ms P-R-T axes 32 71 59 Sinus bradycardia with Premature atrial complexes Incomplete right bundle branch block Borderline ECG When compared with ECG of 29-Jun-2021 17:23, PREVIOUS ECG IS PRESENT     Assessment and Plan:  1. Afib  S/p ablation 6/13 Maintaining  SR Continue  flecainide 50 mg bid and metoprolol 12.5 mg bid   2. Chest discomfort  Describes as burning that will radiate  from mid chest  into low  throat  will note mid morning and last a few minutes to a hour  Recent stress test low risk and is not exertional  Has improved but not resolved Continue PPI at BID for another month and then can go back to one a day  I am  concerned re reflux with alcohol use and pt has cut back to 3x a week vrs nightly, he states with intake of alcohol prior to ablation, this was not been an issue He may need to talk to PCP re endoscopy if does not improve   3. HTN Controlled today  Avoid salt in the diet   4. CHA2DS2VASc  score of 1 Continue  eliquis 5 mg bid without interruption    F/u with Dr. Lalla Brothers 9/11 Afib clinic  as needed   Lupita Leash C. [facility] Folks [facility] [address] [place], [place] [address] [phone]

## 2021-08-25 ENCOUNTER — Other Ambulatory Visit (HOSPITAL_BASED_OUTPATIENT_CLINIC_OR_DEPARTMENT_OTHER): Payer: Self-pay | Admitting: Family

## 2021-08-25 DIAGNOSIS — I10 Essential (primary) hypertension: Secondary | ICD-10-CM

## 2021-08-25 NOTE — Telephone Encounter (Signed)
Pt has never seen Dr. Cristal Deer and does not have an upcoming appointment with her. He is a pt of Dr. Lalla Brothers and you. Please advise if ok to refill this or if I need to send to Dr. Lalla Brothers. Thank you!

## 2021-08-27 NOTE — Telephone Encounter (Signed)
Rx(s) sent to pharmacy electronically.  

## 2021-09-11 ENCOUNTER — Other Ambulatory Visit: Payer: Self-pay | Admitting: Student

## 2021-09-13 NOTE — Telephone Encounter (Signed)
Pt's pharmacy is requesting a refill on pantoprazole. Would Dr. Lambert like to refill this medication? Please address 

## 2021-09-19 ENCOUNTER — Other Ambulatory Visit: Payer: Self-pay | Admitting: Internal Medicine

## 2021-09-20 ENCOUNTER — Other Ambulatory Visit: Payer: Self-pay | Admitting: Internal Medicine

## 2021-09-21 NOTE — Telephone Encounter (Signed)
This is a A-Fib clinic pt, that was just seen. Please address

## 2021-09-21 NOTE — Telephone Encounter (Signed)
Prescription refill request for Eliquis received. Indication:Afib Last office visit:8/23 Scr:0.7 Age: 61 Weight:100.8 kg  Prescription refilled

## 2021-09-23 ENCOUNTER — Other Ambulatory Visit: Payer: Self-pay | Admitting: *Deleted

## 2021-09-26 NOTE — Progress Notes (Unsigned)
Electrophysiology Office Follow up Visit Note:    Date:  09/26/2021   ID:  Justin Young, DOB Jul 15, 1960, MRN [id number]  PCP:  [name]  [facility] Cardiologist:  Jodelle Red, MD  [facility] Electrophysiologist:  Lanier Prude, MD    Interval History:    Justin Young is a 61 y.o. male who presents for a follow up visit.  He had a atrial fibrillation ablation on June 29, 2021.  During the ablation the pulmonary veins were isolated.  The patient saw Lupita Leash in the A-fib clinic August 17, 2021 in follow-up.  He reported some chest discomfort concerning for coronary artery disease so a stress test was ordered which returned as low risk.  He did report some acid reflux symptoms as well.  He is maintained on flecainide and metoprolol.       Past Medical History:  Diagnosis Date   Arthritis    GERD (gastroesophageal reflux disease)    mild -rolaids if necessary   Hypertension    Insomnia    Paroxysmal atrial fibrillation (HCC)    Spinal stenosis at L4-L5 level    Syncope 2020    Past Surgical History:  Procedure Laterality Date   ANKLE RECONSTRUCTION  left   3 procedures total   ATRIAL FIBRILLATION ABLATION N/A 06/29/2021   Procedure: ATRIAL FIBRILLATION ABLATION;  Surgeon: Lanier Prude, MD;  [facility]: [facility] INVASIVE CV LAB;  Service: Cardiovascular;  Laterality: N/A;   CLAVICLE SURGERY     bilateral   KNEE ARTHROSCOPY  2006-most current   left knee X 2 1 ON RIGHT   KNEE ARTHROSCOPY WITH MEDIAL MENISECTOMY  01/27/2012   Procedure: KNEE ARTHROSCOPY WITH MEDIAL MENISECTOMY;  Surgeon: Jacki Cones, MD;  Location: [facility] ;  Service: Orthopedics;  Laterality: Left;   LUMBAR LAMINECTOMY/DECOMPRESSION MICRODISCECTOMY N/A 03/16/2016   Procedure: CENTRAL DECOMPRESSIVE LUMBAR LAMINECTOMY LUMBAR FOUR TO LUMBAR FIVE;  Surgeon: Ranee Gosselin, MD;  Location: [facility];  Service: Orthopedics;  Laterality: N/A;   TEE  WITHOUT CARDIOVERSION  06/29/2021   Procedure: TRANSESOPHAGEAL ECHOCARDIOGRAM (TEE);  Surgeon: Lanier Prude, MD;  Location: [facility] INVASIVE CV LAB;  Service: Cardiovascular;;   TONSILLECTOMY  AS CHILD    Current Medications: No outpatient medications have been marked as taking for the 09/27/21 encounter (Appointment) with Lanier Prude, MD.     Allergies:   Amlodipine, Sulfa antibiotics, and Codeine   Social History   Socioeconomic History   Marital status: Married    Spouse name: Gavin Pound   Number of children: 3   Years of education: 18   Highest education level: Not on file  Occupational History   Occupation: Careers adviser: [employer]    Comment: self-employed  Tobacco Use   Smoking status: Former    Packs/day: 3.00    Years: 30.00    Total pack years: 90.00    Types: Cigarettes    Quit date: 01/25/2007    Years since quitting: 14.6   Smokeless tobacco: Never   Tobacco comments:    Former smoker 11/02/2020  Vaping Use   Vaping Use: Never used  Substance and Sexual Activity   Alcohol use: Not Currently    Alcohol/week: 7.0 standard drinks of alcohol    Types: 7 Shots of liquor per week    Comment: Stop drinking 6 days ago(couple vodka drinks per day) 02/08/21   Drug use: No   Sexual activity: Yes    Partners:  Female  Other Topics Concern   Not on file  Social History Narrative   HSG, [employer] - BS [employer], [employer] -misc business classes.Married '84 wife Gavin Pound: 3 kids - g b g: '86, '88, '95. 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  ROS:   Please see the history of present illness.    All other systems reviewed and are negative.  EKGs/Labs/Other Studies Reviewed:    The following studies were reviewed today:   EKG:  The ekg ordered today demonstrates ***  Recent Labs: 06/07/2021: BUN 19; Creatinine, Ser 0.77; Hemoglobin 11.7; Platelets 225; Potassium 5.0; Sodium 139  Recent Lipid Panel    Component Value Date/Time   CHOL 179 09/09/2014 1150   TRIG 191.0 (H) 09/09/2014 1150   HDL 40.10 09/09/2014 1150   CHOLHDL 4 09/09/2014 1150   VLDL 38.2 09/09/2014 1150   LDLCALC 100 (H) 09/09/2014 1150   LDLDIRECT 134.8 03/07/2012 1551    Physical Exam:    VS:  There were no vitals taken for this visit.    Wt Readings from Last 3 Encounters:  08/17/21 222 lb 3.2 oz (100.8 kg)  07/16/21 223 lb (101.2 kg)  07/14/21 223 lb 3.2 oz (101.2 kg)     GEN: *** Well nourished, well developed in no acute distress HEENT: Normal NECK: No JVD; No carotid bruits LYMPHATICS: No lymphadenopathy CARDIAC: ***RRR, no murmurs, rubs, gallops RESPIRATORY:  Clear to auscultation without rales, wheezing or rhonchi  ABDOMEN: Soft, non-tender, non-distended MUSCULOSKELETAL:  No edema; No deformity  SKIN: Warm and dry NEUROLOGIC:  Alert and oriented x 3 PSYCHIATRIC:  Normal affect        ASSESSMENT:    1. Paroxysmal atrial fibrillation (HCC)   2. Essential hypertension   3. OSA (obstructive sleep apnea)   4. Encounter for long-term (current) use of high-risk medication    PLAN:    In order of problems listed above:  #Paroxysmal atrial fibrillation Post ablation June 29, 2021.  On flecainide and  metoprolol.  #Hypertension *** goal today.  Recommend checking blood pressures 1-2 times per week at home and recording the values.  Recommend bringing these recordings to the primary care physician.  #OSA          Total time spent with patient today *** minutes. This includes reviewing records, evaluating the patient and coordinating care.   Medication Adjustments/Labs and Tests Ordered: Current medicines are reviewed at length with the patient today.  Concerns regarding medicines are outlined above.  No orders of the defined types were placed in this encounter.  No orders of the defined types were placed in this encounter.    Signed, Steffanie Dunn, MD, [facility], [facility] 09/26/2021 9:43 PM    Electrophysiology [facility]

## 2021-09-27 ENCOUNTER — Ambulatory Visit: Payer: BC Managed Care – PPO | Attending: Cardiology | Admitting: Cardiology

## 2021-09-27 ENCOUNTER — Encounter: Payer: Self-pay | Admitting: Cardiology

## 2021-09-27 VITALS — BP 130/92 | HR 52 | Ht 70.0 in | Wt 225.0 lb

## 2021-09-27 DIAGNOSIS — G4733 Obstructive sleep apnea (adult) (pediatric): Secondary | ICD-10-CM | POA: Diagnosis not present

## 2021-09-27 DIAGNOSIS — Z79899 Other long term (current) drug therapy: Secondary | ICD-10-CM

## 2021-09-27 DIAGNOSIS — I10 Essential (primary) hypertension: Secondary | ICD-10-CM

## 2021-09-27 DIAGNOSIS — I480 Paroxysmal atrial fibrillation: Secondary | ICD-10-CM

## 2021-09-27 NOTE — Progress Notes (Signed)
Electrophysiology Office Follow up Visit Note:    Date:  09/27/2021   ID:  Justin Young, DOB August 07, 1960, MRN [id number]  PCP:  [name]  [facility] Cardiologist:  Jodelle Red, MD  [facility] Electrophysiologist:  Lanier Prude, MD    Interval History:    Justin Young is a 61 y.o. male who presents for a follow up visit. He had a atrial fibrillation ablation on June 29, 2021.  During the ablation the pulmonary veins were isolated.  The patient saw Lupita Leash in the A-fib clinic August 17, 2021 in follow-up.  He reported some chest discomfort concerning for coronary artery disease so a stress test was ordered which returned as low risk.  He did report some acid reflux symptoms as well.  He is maintained on flecainide and metoprolol.  Today, he is accompanied by a family member. Overall his palpitations and chest pains continue to improve and subside; he endorses a 85-90% reduction in severity.  He states that his heart rate has been "different". He describes intermittent "quick flutters" that last only for a second. They have not developed into full arrhythmic episodes.   Following his ablation, he was also suffering from subsequent chest pain. He states that "my heart hurt me for a month and a half".  He also developed a pain similar to acid reflux.  He denies any shortness of breath, or peripheral edema. No lightheadedness, headaches, syncope, orthopnea, or PND.      Past Medical History:  Diagnosis Date   Arthritis    GERD (gastroesophageal reflux disease)    mild -rolaids if necessary   Hypertension    Insomnia    Paroxysmal atrial fibrillation (HCC)    Spinal stenosis at L4-L5 level    Syncope 2020    Past Surgical History:  Procedure Laterality Date   ANKLE RECONSTRUCTION  left   3 procedures total   ATRIAL FIBRILLATION ABLATION N/A 06/29/2021   Procedure: ATRIAL FIBRILLATION ABLATION;  Surgeon: Lanier Prude,  MD;  Location: [facility] INVASIVE CV LAB;  Service: Cardiovascular;  Laterality: N/A;   CLAVICLE SURGERY     bilateral   KNEE ARTHROSCOPY  2006-most current   left knee X 2 1 ON RIGHT   KNEE ARTHROSCOPY WITH MEDIAL MENISECTOMY  01/27/2012   Procedure: KNEE ARTHROSCOPY WITH MEDIAL MENISECTOMY;  Surgeon: Jacki Cones, MD;  Location: [facility] [place];  Service: Orthopedics;  Laterality: Left;   LUMBAR LAMINECTOMY/DECOMPRESSION MICRODISCECTOMY N/A 03/16/2016   Procedure: CENTRAL DECOMPRESSIVE LUMBAR LAMINECTOMY LUMBAR FOUR TO LUMBAR FIVE;  Surgeon: Ranee Gosselin, MD;  Location: [facility];  Service: Orthopedics;  Laterality: N/A;   TEE WITHOUT CARDIOVERSION  06/29/2021   Procedure: TRANSESOPHAGEAL ECHOCARDIOGRAM (TEE);  Surgeon: Lanier Prude, MD;  Location: [facility] INVASIVE CV LAB;  Service: Cardiovascular;;   TONSILLECTOMY  AS CHILD    Current Medications: Current Meds  Medication Sig   acetaminophen (TYLENOL 8 HOUR) 650 MG CR tablet Take 1 tablet (650 mg total) by mouth every 8 (eight) hours as needed.   clonazePAM (KLONOPIN) 0.5 MG tablet Take 0.5 mg by mouth at bedtime.   ELIQUIS 5 MG TABS tablet TAKE 1 TABLET BY MOUTH TWICE A DAY   losartan (COZAAR) 50 MG tablet Take 1 tablet (50 mg total) by mouth daily.   metoprolol tartrate (LOPRESSOR) 25 MG tablet Take 0.5 tablets (12.5 mg total) by mouth 2 (two) times daily.   omeprazole (PRILOSEC) 20 MG capsule Take 20 mg by mouth  2 (two) times daily.   [DISCONTINUED] flecainide (TAMBOCOR) 100 MG tablet Take 0.5 tablets (50 mg total) by mouth 2 (two) times daily.     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[employer]    Comment: self-employed  Tobacco Use   Smoking  status: Former    Packs/day: 3.00    Years: 30.00    Total pack years: 90.00    Types: Cigarettes    Quit date: 01/25/2007    Years since quitting: 14.6   Smokeless tobacco: Never   Tobacco comments:    Former smoker 11/02/2020  Vaping Use   Vaping Use: Never used  Substance and Sexual Activity   Alcohol use: Not Currently    Alcohol/week: 7.0 standard drinks of alcohol    Types: 7 Shots of liquor per week    Comment: Stop drinking 6 days ago(couple vodka drinks per day) 02/08/21   Drug use: No   Sexual activity: Yes    Partners: Female  Other Topics Concern   Not on file  Social History Narrative   HSG, [employer] - BS [employer], [employer] -misc business classes.Married '84 wife Justin Young: 3 kids - g b g: '86, '88, '95. 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  ROS:   Please see the history of present illness.    (+) Palpitations (+) Chest pain (+) Easy bleeding All other systems reviewed and are negative.  EKGs/Labs/Other Studies Reviewed:    The following studies were reviewed today:  07/16/2021  Lexiscan Stress Test:   The study is normal. The study is low risk.   No ST deviation was noted.   LV perfusion is normal. There is no evidence of ischemia. There is no evidence of  infarction.   Left ventricular function is normal. Nuclear stress EF: 56 %. The left ventricular ejection fraction is normal (55-65%). End diastolic cavity size is mildly enlarged.   Prior study not available for comparison.  06/29/2021  TEE:  1. The aortic valve is normal in structure. Aortic valve regurgitation is  not visualized.   2. There is mild (Grade II) plaque involving the descending aorta.   3. Limited study to R/O aortic dissection during atrial fibrillation  ablation; no dissection noted; trileaflet aortic valve with no AI.   Conclusion(s)/Recommendation(s): Normal biventricular function without  evidence of hemodynamically significant valvular heart disease.    EKG:  EKG is personally reviewed.  09/27/2021: Sinus rhythm.  Ventricular rate 52 bpm.  Recent Labs: 06/07/2021:  BUN 19; Creatinine, Ser 0.77; Hemoglobin 11.7; Platelets 225; Potassium 5.0; Sodium 139   Recent Lipid Panel    Component Value Date/Time   CHOL 179 09/09/2014 1150   TRIG 191.0 (H) 09/09/2014 1150   HDL 40.10 09/09/2014 1150   CHOLHDL 4 09/09/2014 1150   VLDL 38.2 09/09/2014 1150   LDLCALC 100 (H) 09/09/2014 1150   LDLDIRECT 134.8 03/07/2012 1551    Physical Exam:    VS:  BP (!) 130/92   Pulse (!) 52   Ht 5\' 10"  (1.778 m)   Wt 225 lb (102.1 kg)   SpO2 94%   BMI 32.28 kg/m     Wt Readings from Last 3 Encounters:  09/27/21 225 lb (102.1 kg)  08/17/21 222 lb 3.2 oz (100.8 kg)  07/16/21 223 lb (101.2 kg)     GEN: Well nourished, well developed in no acute distress HEENT: Normal NECK: No JVD; No carotid bruits LYMPHATICS: No lymphadenopathy CARDIAC: RRR, no murmurs, rubs, gallops RESPIRATORY:  Clear to auscultation without rales, wheezing or rhonchi  ABDOMEN: Soft, non-tender, non-distended MUSCULOSKELETAL:  No edema; No deformity  SKIN: Warm and dry NEUROLOGIC:  Alert and oriented x 3 PSYCHIATRIC:  Normal affect        ASSESSMENT:    1. Paroxysmal atrial fibrillation (HCC)    2. Essential hypertension   3. OSA (obstructive sleep apnea)   4. Encounter for long-term (current) use of high-risk medication    PLAN:    In order of problems listed above:  #Paroxysmal atrial fibrillation Post ablation June 29, 2021.  On flecainide and metoprolol. Okay to stop flecainide at this point given he has had a good response after his ablation procedure.  He will continue taking Eliquis for now.  If he continues to maintain sinus rhythm following the ablation, can revisit this question in 6 months.  If we do transition off Eliquis in 6 months, would start aspirin 81 mg by mouth once daily.  He will need to employ some form of heart rhythm monitoring if we stop the Eliquis [employer], Galaxy watch, cardia mobile).  CHA2DS2-VASc Score = 1  The patient's score is based upon: CHF History: 0 HTN History: 1 Diabetes History: 0 Stroke History: 0 Vascular Disease History: 0 Age Score: 0 Gender Score: 0   #Hypertension At goal today.  Recommend checking blood pressures 1-2 times per week at home and recording the values.  Recommend bringing these recordings to the primary care physician.   #OSA      Medication Adjustments/Labs and Tests Ordered: Current medicines are reviewed at length with the patient today.  Concerns regarding medicines are outlined above.  Orders Placed This Encounter  Procedures   EKG 12-Lead   No orders of the defined types were placed in this encounter.   I,Mathew Stumpf,acting as a Education administrator for Vickie Epley, MD.,have documented all relevant documentation on the behalf of Vickie Epley, MD,as directed by  Vickie Epley, MD while in the presence of Vickie Epley, MD.  I, Vickie Epley, MD, have reviewed all documentation for this visit. The documentation on 09/27/21 for the exam, diagnosis, procedures, and orders are all accurate and complete.   Signed, Lars Mage, MD, [facility], [facility] 09/27/2021 3:21 PM     Electrophysiology [facility]

## 2021-09-27 NOTE — Patient Instructions (Signed)
Medication Instructions:  Stop Flecainide  *If you need a refill on your cardiac medications before your next appointment, please call your pharmacy*   Lab Work: none If you have labs (blood work) drawn today and your tests are completely normal, you will receive your results only by: MyChart Message (if you have MyChart) OR A paper copy in the mail If you have any lab test that is abnormal or we need to change your treatment, we will call you to review the results.   Testing/Procedures: none   Follow-Up: At [facility], you and your health needs are our priority.  As part of our continuing mission to provide you with exceptional heart care, we have created designated Provider Care Teams.  These Care Teams include your primary Cardiologist (physician) and Advanced Practice Providers (APPs -  Physician Assistants and Nurse Practitioners) who all work together to provide you with the care you need, when you need it.  We recommend signing up for the patient portal called "MyChart".  Sign up information is provided on this After Visit Summary.  MyChart is used to connect with patients for Virtual Visits (Telemedicine).  Patients are able to view lab/test results, encounter notes, upcoming appointments, etc.  Non-urgent messages can be sent to your provider as well.   To learn more about what you can do with MyChart, go to [email or web address].    Your next appointment:   6 month(s)  The format for your next appointment:   In Person  Provider:   Steffanie Dunn, MD    Other Instructions None   Important Information About Sugar

## 2021-10-18 ENCOUNTER — Ambulatory Visit: Payer: Self-pay | Admitting: Surgery

## 2021-10-18 NOTE — Progress Notes (Signed)
Sent message, via epic in basket, requesting orders in epic from surgeon.  

## 2021-10-19 NOTE — Progress Notes (Signed)
Anesthesia Review:  PCP: Bing Matter  Cardiologist : Lars Mage LOV 09/27/21  Chest x-ray : EKG : 09/28/21  Echo  06/29/21  06/29/21- afib ablation  06/22/21- CT Cardiac  Stress test: 07/16/21  Cardiac Cath :  Activity level:  Sleep Study/ CPAP : Fasting Blood Sugar :      / Checks Blood Sugar -- times a day:   Blood Thinner/ Instructions /Last Dose: ASA / Instructions/ Last Dose :   Eliquis

## 2021-10-20 NOTE — Patient Instructions (Addendum)
SURGICAL WAITING ROOM VISITATION Patients having surgery or a procedure may have no more than 2 support people in the waiting area - these visitors may rotate.   Children under the age of 65 must have an adult with them who is not the patient. If the patient needs to stay at the hospital during part of their recovery, the visitor guidelines for inpatient rooms apply. Pre-op nurse will coordinate an appropriate time for 1 support person to accompany patient in pre-op.  This support person may not rotate.    Please refer to the [facility] website for the visitor guidelines for Inpatients (after your surgery is over and you are in a regular room).       Your procedure is scheduled on:  11/01/2021    Report to [facility] Main Entrance    Report to admitting at   0600 AM   Call this number if you have problems the morning of surgery [phone]   Do not eat food :After Midnight.   After Midnight you may have the following liquids until __ 0500____ AM  DAY OF SURGERY  Water Non-Citrus Juices (without pulp, NO RED) Carbonated Beverages Black Coffee (NO MILK/CREAM OR CREAMERS, sugar ok)  Clear Tea (NO MILK/CREAM OR CREAMERS, sugar ok) regular and decaf                             Plain Jell-O (NO RED)                                           Fruit ices (not with fruit pulp, NO RED)                                     Popsicles (NO RED)                                                               Sports drinks like Gatorade (NO RED)                    The day of surgery:  Drink ONE (1) Pre-Surgery Clear Ensure or G2 at  0500  AM ( have completed by )  the morning of surgery. Drink in one sitting. Do not sip.  This drink was given to you during your hospital  pre-op appointment visit. Nothing else to drink after completing the  Pre-Surgery Clear Ensure or G2.          If you have questions, please contact your surgeon's office.   Oral Hygiene is also important to  reduce your risk of infection.                                    Remember - BRUSH YOUR TEETH THE MORNING OF SURGERY WITH YOUR REGULAR TOOTHPASTE   Take these medicines the morning of surgery with A SIP OF WATER:  metoprolol, omeprazole  You may not have any metal on your body including jewelry, and body piercing             Do not wear lotions, powders, cologne, or deodorant  Do not wear nail polish including gel and S&S, artificial/acrylic nails, or any other type of covering on natural nails including finger and toenails. If you have artificial nails, gel coating, etc. that needs to be removed by a nail salon please have this removed prior to surgery or surgery may need to be canceled/ delayed if the surgeon/ anesthesia feels like they are unable to be safely monitored.   Do not shave  48 hours prior to surgery.               Men may shave face and neck.   Do not bring valuables to the hospital. [facility] IS NOT             RESPONSIBLE   FOR VALUABLES.  DO NOT BRING YOUR HOME MEDICATIONS TO THE HOSPITAL. PHARMACY WILL DISPENSE MEDICATIONS LISTED ON YOUR MEDICATION LIST TO YOU DURING YOUR ADMISSION IN THE HOSPITAL!    Patients discharged on the day of surgery will not be allowed to drive home.  Someone NEEDS to stay with you for the first 24 hours after anesthesia.              Please read over the following fact sheets you were given: IF YOU HAVE QUESTIONS ABOUT YOUR PRE-OP INSTRUCTIONS PLEASE CALL [phone] 2009[name]   If you received a COVID test during your pre-op visit  it is requested that you wear a mask when out in public, stay away from anyone that may not be feeling well and notify your surgeon if you develop symptoms. If you test positive for Covid or have been in contact with anyone that has tested positive in the last 10 days please notify you surgeon.      - Preparing for Surgery Before surgery, you can play an important role.   Because skin is not sterile, your skin needs to be as free of germs as possible.  You can reduce the number of germs on your skin by washing with CHG (chlorahexidine gluconate) soap before surgery.  CHG is an antiseptic cleaner which kills germs and bonds with the skin to continue killing germs even after washing. Please DO NOT use if you have an allergy to CHG or antibacterial soaps.  If your skin becomes reddened/irritated stop using the CHG and inform your nurse when you arrive at Short Stay. Do not shave (including legs and underarms) for at least 48 hours prior to the first CHG shower.  You may shave your face/neck. Please follow these instructions carefully:  1.  Shower with CHG Soap the night before surgery and the  morning of Surgery.  2.  If you choose to wash your hair, wash your hair first as usual with your  normal  shampoo.  3.  After you shampoo, rinse your hair and body thoroughly to remove the  shampoo.                           4.  Use CHG as you would any other liquid soap.  You can apply chg directly  to the skin and wash                       Gently with a scrungie or clean  washcloth.  5.  Apply the CHG Soap to your body ONLY FROM THE NECK DOWN.   Do not use on face/ open                           Wound or open sores. Avoid contact with eyes, ears mouth and genitals (private parts).                       Wash face,  Genitals (private parts) with your normal soap.             6.  Wash thoroughly, paying special attention to the area where your surgery  will be performed.  7.  Thoroughly rinse your body with warm water from the neck down.  8.  DO NOT shower/wash with your normal soap after using and rinsing off  the CHG Soap.                9.  Pat yourself dry with a clean towel.            10.  Wear clean pajamas.            11.  Place clean sheets on your bed the night of your first shower and do not  sleep with pets. Day of Surgery : Do not apply any lotions/deodorants the  morning of surgery.  Please wear clean clothes to the hospital/surgery center.  FAILURE TO FOLLOW THESE INSTRUCTIONS MAY RESULT IN THE CANCELLATION OF YOUR SURGERY PATIENT SIGNATURE_________________________________  NURSE SIGNATURE__________________________________  ________________________________________________________________________

## 2021-10-25 ENCOUNTER — Encounter (HOSPITAL_COMMUNITY): Payer: Self-pay

## 2021-10-25 ENCOUNTER — Encounter (HOSPITAL_COMMUNITY)
Admission: RE | Admit: 2021-10-25 | Discharge: 2021-10-25 | Disposition: A | Discharge status: Home / Self Care | Payer: BC Managed Care – PPO | Source: Ambulatory Visit | Attending: Surgery | Admitting: Surgery

## 2021-10-25 VITALS — BP 132/86 | HR 66 | Temp 98.0°F | Resp 18 | Ht 70.0 in | Wt 223.1 lb

## 2021-10-25 DIAGNOSIS — Z01818 Encounter for other preprocedural examination: Secondary | ICD-10-CM | POA: Insufficient documentation

## 2021-10-25 LAB — BASIC METABOLIC PANEL
Anion gap: 6 (ref 5–15)
BUN: 23 mg/dL — ABNORMAL HIGH (ref 6–20)
CO2: 27 mmol/L (ref 22–32)
Calcium: 8.9 mg/dL (ref 8.9–10.3)
Chloride: 107 mmol/L (ref 98–111)
Creatinine, Ser: 1 mg/dL (ref 0.61–1.24)
GFR, Estimated: >60 mL/min (ref >60)
Glucose, Bld: 133 mg/dL — ABNORMAL HIGH (ref 70–99)
Potassium: 4.2 mmol/L (ref 3.5–5.1)
Sodium: 140 mmol/L (ref 135–145)

## 2021-10-25 LAB — CBC
HCT: 34.4 % — ABNORMAL LOW (ref 39.0–52.0)
Hemoglobin: 9.9 g/dL — ABNORMAL LOW (ref 13.0–17.0)
MCH: 22.7 pg — ABNORMAL LOW (ref 26.0–34.0)
MCHC: 28.8 g/dL — ABNORMAL LOW (ref 30.0–36.0)
MCV: 78.7 fL — ABNORMAL LOW (ref 80.0–100.0)
Platelets: 270 10*3/uL (ref 150–400)
RBC: 4.37 MIL/uL (ref 4.22–5.81)
RDW: 17.3 % — ABNORMAL HIGH (ref 11.5–15.5)
WBC: 6.8 10*3/uL (ref 4.0–10.5)
nRBC: 0 % (ref 0.0–0.2)

## 2021-10-25 NOTE — Progress Notes (Signed)
Hgb 9.9 results routed to Dr. Thermon Leyland.

## 2021-10-25 NOTE — Progress Notes (Addendum)
COVID Vaccine Completed: yes  Date of COVID positive in last 90 days: no  PCP - [facility] Cardiologist - Lars Mage  Chest x-ray - CT 06/22/21 Epic EKG - 09/28/21 Epic Stress Test - 07/16/21 Epic ECHO - 06/29/21 Epic Cardiac Cath - 06/29/21 Pacemaker/ICD device last checked: n/a Spinal Cord Stimulator: n/a  Bowel Prep - no  Sleep Study - yes, positive CPAP - no  Fasting Blood Sugar - n/a Checks Blood Sugar _____ times a day  Blood Thinner Instructions: Eliquis, no instructions, pt will call cards  Aspirin Instructions: Last Dose:  Activity level: Can go up a flight of stairs and perform activities of daily living without stopping and without symptoms of chest pain or shortness of breath.    Anesthesia review: a fib, HTN, Hbg 9.9  Patient denies shortness of breath, fever, cough and chest pain at PAT appointment  Patient verbalized understanding of instructions that were given to them at the PAT appointment. Patient was also instructed that they will need to review over the PAT instructions again at home before surgery.

## 2021-10-29 ENCOUNTER — Telehealth: Payer: Self-pay | Admitting: Cardiology

## 2021-10-29 NOTE — Telephone Encounter (Signed)
   Patient Name: Justin Young  DOB: 21-Sep-1960 MRN: [id number]  Primary Cardiologist: Buford Dresser, MD  Chart reviewed as part of pre-operative protocol coverage. Pre-op clearance already addressed by colleagues in earlier phone notes. To summarize recommendations:  -Per office protocol, patient can hold Eliquis for 2 days prior to procedure. Please restart when medically safe to do so.  Medical clearance was not requested  Will route this bundled recommendation to requesting provider via Epic fax function and remove from pre-op pool. Please call with questions.  Elgie Collard, PA-C 10/29/2021, 1:24 PM

## 2021-10-29 NOTE — Telephone Encounter (Signed)
Called requesting office and was given fax # [phone] attn: [email or web address].

## 2021-10-29 NOTE — Telephone Encounter (Signed)
Patient with diagnosis of A Fib on Eliquis for anticoagulation.    Procedure: Belly button hernia surgery Date of procedure: 11/01/21   CHA2DS2-VASc Score = 1  This indicates a 0.6% annual risk of stroke. The patient's score is based upon: CHF History: 0 HTN History: 1 Diabetes History: 0 Stroke History: 0 Vascular Disease History: 0 Age Score: 0 Gender Score: 0       CrCl 113 mL/min Platelet count 270K    Per office protocol, patient can hold Eliquis for 2 days prior to procedure.     **This guidance is not considered finalized until pre-operative APP has relayed final recommendations.**

## 2021-10-29 NOTE — Telephone Encounter (Signed)
   Pre-operative Risk Assessment    Patient Name: Justin Young  DOB: [birth year] MRN: [id number]      Request for Surgical Clearance    Procedure:   Belly button hernia surgery  Date of Surgery:  Clearance 11/01/21                                 Surgeon:  Dr. Thermon Leyland Surgeon's Group or Practice Name:  [facility] Phone number:  [phone] Fax number:     Type of Clearance Requested:   - Pharmacy:  Hold Apixaban (Eliquis)     Type of Anesthesia:  Not Indicated   Additional requests/questions:   Patient find out if he should hold his Eliquis for this procedure  Signed, Heloise Beecham   10/29/2021, 10:10 AM

## 2021-10-29 NOTE — Telephone Encounter (Signed)
Caller is following up on status of clearance for patient's surgery on Monday, 11/01/21.  Caller stated they want to get medical clearance for the patient and want to know how long to hold the patient's Eliquis.

## 2021-10-31 NOTE — Anesthesia Preprocedure Evaluation (Addendum)
Anesthesia Evaluation  Patient identified by MRN, date of birth, ID band Patient awake    Reviewed: Allergy & Precautions, NPO status , Patient's Chart, lab work & pertinent test results, reviewed documented beta blocker date and time   Airway Mallampati: IV  TM Distance: >3 FB Neck ROM: Full    Dental  (+) Teeth Intact, Dental Advisory Given   Pulmonary sleep apnea (noncompliant w/ CPAP) , former smoker,  Quit smoking 2009, 90 pack year history  No inhalers    Pulmonary exam normal breath sounds clear to auscultation       Cardiovascular hypertension (146/91 in preop, per pt normally 130s-140s SBP), Pt. on medications and Pt. on home beta blockers Normal cardiovascular exam+ dysrhythmias (eliquis- last dose 48h ago) Atrial Fibrillation  Rhythm:Regular Rate:Normal  Echo 06/2021 1. The aortic valve is normal in structure. Aortic valve regurgitation is  not visualized.  2. There is mild (Grade II) plaque involving the descending aorta.  3. Limited study to R/O aortic dissection during atrial fibrillation  ablation; no dissection noted; trileaflet aortic valve with no AI.    Neuro/Psych negative neurological ROS  negative psych ROS   GI/Hepatic GERD  Controlled and Medicated,(+)       alcohol use,   Endo/Other  Obesity BMI 32  Renal/GU negative Renal ROS  negative genitourinary   Musculoskeletal  (+) Arthritis , Osteoarthritis,  S/p lumbar surgery 2018 L4-5   Abdominal   Peds  Hematology  (+) Blood dyscrasia, anemia , Hb 9.9, plt 270   Anesthesia Other Findings   Reproductive/Obstetrics negative OB ROS                           Anesthesia Physical Anesthesia Plan  ASA: 3  Anesthesia Plan: General   Post-op Pain Management: Tylenol PO (pre-op)* and Ketamine IV*   Induction:   PONV Risk Score and Plan: 2 and Ondansetron, Dexamethasone, Midazolam and Treatment may vary due to age or  medical condition  Airway Management Planned: Oral ETT  Additional Equipment: None  Intra-op Plan:   Post-operative Plan: Extubation in OR  Informed Consent: I have reviewed the patients History and Physical, chart, labs and discussed the procedure including the risks, benefits and alternatives for the proposed anesthesia with the patient or authorized representative who has indicated his/her understanding and acceptance.     Dental advisory given  Plan Discussed with: CRNA  Anesthesia Plan Comments: (Last airway from Afib ablation 06/2021: Ventilation: Mask ventilation without difficulty Laryngoscope Size: Mac and 3 Grade View: Grade III Tube type: Oral Tube size: 8.0 mm Number of attempts: 1)      Anesthesia Quick Evaluation

## 2021-11-01 ENCOUNTER — Encounter (HOSPITAL_COMMUNITY): Payer: Self-pay | Admitting: Surgery

## 2021-11-01 ENCOUNTER — Other Ambulatory Visit: Payer: Self-pay

## 2021-11-01 ENCOUNTER — Ambulatory Visit (HOSPITAL_COMMUNITY)
Admission: RE | Admit: 2021-11-01 | Discharge: 2021-11-01 | Disposition: A | Discharge status: Home / Self Care | Payer: BC Managed Care – PPO | Attending: Surgery | Admitting: Surgery

## 2021-11-01 ENCOUNTER — Encounter (HOSPITAL_COMMUNITY)
Admission: RE | Disposition: A | Discharge status: Home / Self Care | Payer: Self-pay | Source: Home / Self Care | Attending: Surgery

## 2021-11-01 ENCOUNTER — Ambulatory Visit (HOSPITAL_COMMUNITY): Payer: BC Managed Care – PPO | Admitting: Physician Assistant

## 2021-11-01 ENCOUNTER — Ambulatory Visit (HOSPITAL_COMMUNITY): Payer: BC Managed Care – PPO | Admitting: Anesthesiology

## 2021-11-01 DIAGNOSIS — I10 Essential (primary) hypertension: Secondary | ICD-10-CM | POA: Diagnosis not present

## 2021-11-01 DIAGNOSIS — G4730 Sleep apnea, unspecified: Secondary | ICD-10-CM | POA: Diagnosis not present

## 2021-11-01 DIAGNOSIS — Z87891 Personal history of nicotine dependence: Secondary | ICD-10-CM | POA: Insufficient documentation

## 2021-11-01 DIAGNOSIS — K429 Umbilical hernia without obstruction or gangrene: Secondary | ICD-10-CM | POA: Diagnosis present

## 2021-11-01 DIAGNOSIS — K219 Gastro-esophageal reflux disease without esophagitis: Secondary | ICD-10-CM | POA: Insufficient documentation

## 2021-11-01 DIAGNOSIS — Z01818 Encounter for other preprocedural examination: Secondary | ICD-10-CM

## 2021-11-01 HISTORY — PX: UMBILICAL HERNIA REPAIR: SHX196

## 2021-11-01 SURGERY — REPAIR, HERNIA, UMBILICAL, ADULT
Anesthesia: General | Site: Abdomen

## 2021-11-01 MED ORDER — CHLORHEXIDINE GLUCONATE 0.12 % MT SOLN
15.0000 mL | Freq: Once | OROMUCOSAL | Status: AC
  Administered 2021-11-01: 15 mL via OROMUCOSAL

## 2021-11-01 MED ORDER — PROPOFOL 10 MG/ML IV BOLUS
INTRAVENOUS | Status: AC
  Filled 2021-11-01: qty 20

## 2021-11-01 MED ORDER — DEXAMETHASONE SODIUM PHOSPHATE 10 MG/ML IJ SOLN
INTRAMUSCULAR | Status: DC | PRN
  Administered 2021-11-01: 10 mg via INTRAVENOUS

## 2021-11-01 MED ORDER — HYDRALAZINE HCL 20 MG/ML IJ SOLN
5.0000 mg | INTRAMUSCULAR | Status: DC | PRN
  Administered 2021-11-01: 5 mg via INTRAVENOUS

## 2021-11-01 MED ORDER — CHLORHEXIDINE GLUCONATE CLOTH 2 % EX PADS: 6.0000 | MEDICATED_PAD | Freq: Once | CUTANEOUS | Status: DC

## 2021-11-01 MED ORDER — BUPIVACAINE-EPINEPHRINE 0.25% -1:200000 IJ SOLN
INTRAMUSCULAR | Status: DC | PRN
  Administered 2021-11-01: 30 mL

## 2021-11-01 MED ORDER — ACETAMINOPHEN 500 MG PO TABS
1000.0000 mg | ORAL_TABLET | Freq: Once | ORAL | Status: DC
  Filled 2021-11-01: qty 2

## 2021-11-01 MED ORDER — FENTANYL CITRATE (PF) 250 MCG/5ML IJ SOLN
INTRAMUSCULAR | Status: DC | PRN
  Administered 2021-11-01 (×4): 50 ug via INTRAVENOUS

## 2021-11-01 MED ORDER — OXYCODONE-ACETAMINOPHEN 5-325 MG PO TABS: 1.0000 | ORAL_TABLET | ORAL | 0 refills | Status: AC | PRN

## 2021-11-01 MED ORDER — HYDRALAZINE HCL 20 MG/ML IJ SOLN
INTRAMUSCULAR | Status: AC
  Filled 2021-11-01: qty 1

## 2021-11-01 MED ORDER — SUGAMMADEX SODIUM 200 MG/2ML IV SOLN
INTRAVENOUS | Status: DC | PRN
  Administered 2021-11-01: 400 mg via INTRAVENOUS

## 2021-11-01 MED ORDER — LIDOCAINE HCL (PF) 2 % IJ SOLN
INTRAMUSCULAR | Status: AC
  Filled 2021-11-01: qty 5

## 2021-11-01 MED ORDER — 0.9 % SODIUM CHLORIDE (POUR BTL) OPTIME
TOPICAL | Status: DC | PRN
  Administered 2021-11-01: 1000 mL

## 2021-11-01 MED ORDER — BUPIVACAINE-EPINEPHRINE (PF) 0.25% -1:200000 IJ SOLN
INTRAMUSCULAR | Status: AC
  Filled 2021-11-01: qty 30

## 2021-11-01 MED ORDER — GABAPENTIN 300 MG PO CAPS
300.0000 mg | ORAL_CAPSULE | ORAL | Status: AC
  Administered 2021-11-01: 300 mg via ORAL
  Filled 2021-11-01: qty 1

## 2021-11-01 MED ORDER — ONDANSETRON HCL 4 MG/2ML IJ SOLN
INTRAMUSCULAR | Status: DC | PRN
  Administered 2021-11-01: 4 mg via INTRAVENOUS

## 2021-11-01 MED ORDER — OXYCODONE HCL 5 MG PO TABS
5.0000 mg | ORAL_TABLET | Freq: Once | ORAL | Status: AC | PRN
  Administered 2021-11-01: 5 mg via ORAL

## 2021-11-01 MED ORDER — ONDANSETRON HCL 4 MG/2ML IJ SOLN
INTRAMUSCULAR | Status: AC
  Filled 2021-11-01: qty 2

## 2021-11-01 MED ORDER — LACTATED RINGERS IV SOLN
INTRAVENOUS | Status: DC
  Administered 2021-11-01: 1000 mL via INTRAVENOUS

## 2021-11-01 MED ORDER — ROCURONIUM BROMIDE 10 MG/ML (PF) SYRINGE
PREFILLED_SYRINGE | INTRAVENOUS | Status: AC
  Filled 2021-11-01: qty 10

## 2021-11-01 MED ORDER — PROPOFOL 10 MG/ML IV BOLUS
INTRAVENOUS | Status: DC | PRN
  Administered 2021-11-01: 150 mg via INTRAVENOUS

## 2021-11-01 MED ORDER — KETAMINE HCL 10 MG/ML IJ SOLN
INTRAMUSCULAR | Status: DC | PRN
  Administered 2021-11-01: 20 mg via INTRAVENOUS

## 2021-11-01 MED ORDER — PHENYLEPHRINE 80 MCG/ML (10ML) SYRINGE FOR IV PUSH (FOR BLOOD PRESSURE SUPPORT)
PREFILLED_SYRINGE | INTRAVENOUS | Status: AC
  Filled 2021-11-01: qty 10

## 2021-11-01 MED ORDER — EPHEDRINE 5 MG/ML INJ
INTRAVENOUS | Status: AC
  Filled 2021-11-01: qty 5

## 2021-11-01 MED ORDER — ONDANSETRON HCL 4 MG/2ML IJ SOLN: 4.0000 mg | Freq: Once | INTRAMUSCULAR | Status: DC | PRN

## 2021-11-01 MED ORDER — DEXAMETHASONE SODIUM PHOSPHATE 10 MG/ML IJ SOLN
INTRAMUSCULAR | Status: AC
  Filled 2021-11-01: qty 1

## 2021-11-01 MED ORDER — BUPIVACAINE LIPOSOME 1.3 % IJ SUSP: 20.0000 mL | Freq: Once | INTRAMUSCULAR | Status: DC

## 2021-11-01 MED ORDER — MIDAZOLAM HCL 2 MG/2ML IJ SOLN
INTRAMUSCULAR | Status: AC
  Filled 2021-11-01: qty 2

## 2021-11-01 MED ORDER — KETAMINE HCL 50 MG/5ML IJ SOSY
PREFILLED_SYRINGE | INTRAMUSCULAR | Status: AC
  Filled 2021-11-01: qty 5

## 2021-11-01 MED ORDER — OXYCODONE HCL 5 MG/5ML PO SOLN: 5.0000 mg | Freq: Once | ORAL | Status: AC | PRN

## 2021-11-01 MED ORDER — CEFAZOLIN SODIUM-DEXTROSE 2-4 GM/100ML-% IV SOLN
2.0000 g | INTRAVENOUS | Status: AC
  Administered 2021-11-01: 2 g via INTRAVENOUS
  Filled 2021-11-01: qty 100

## 2021-11-01 MED ORDER — SUCCINYLCHOLINE CHLORIDE 200 MG/10ML IV SOSY
PREFILLED_SYRINGE | INTRAVENOUS | Status: AC
  Filled 2021-11-01: qty 10

## 2021-11-01 MED ORDER — ROCURONIUM BROMIDE 10 MG/ML (PF) SYRINGE
PREFILLED_SYRINGE | INTRAVENOUS | Status: DC | PRN
  Administered 2021-11-01: 80 mg via INTRAVENOUS

## 2021-11-01 MED ORDER — LIDOCAINE 2% (20 MG/ML) 5 ML SYRINGE
INTRAMUSCULAR | Status: DC | PRN
  Administered 2021-11-01: 60 mg via INTRAVENOUS

## 2021-11-01 MED ORDER — MIDAZOLAM HCL 2 MG/2ML IJ SOLN
INTRAMUSCULAR | Status: DC | PRN
  Administered 2021-11-01 (×2): 1 mg via INTRAVENOUS

## 2021-11-01 MED ORDER — AMISULPRIDE (ANTIEMETIC) 5 MG/2ML IV SOLN: 10.0000 mg | Freq: Once | INTRAVENOUS | Status: DC | PRN

## 2021-11-01 MED ORDER — ACETAMINOPHEN 500 MG PO TABS
1000.0000 mg | ORAL_TABLET | ORAL | Status: AC
  Administered 2021-11-01: 1000 mg via ORAL

## 2021-11-01 MED ORDER — KETOROLAC TROMETHAMINE 30 MG/ML IJ SOLN: 30.0000 mg | Freq: Once | INTRAMUSCULAR | Status: DC | PRN

## 2021-11-01 MED ORDER — OXYCODONE HCL 5 MG PO TABS
ORAL_TABLET | ORAL | Status: AC
  Filled 2021-11-01: qty 1

## 2021-11-01 MED ORDER — HYDROMORPHONE HCL 1 MG/ML IJ SOLN: 0.2500 mg | INTRAMUSCULAR | Status: DC | PRN

## 2021-11-01 MED ORDER — ORAL CARE MOUTH RINSE: 15.0000 mL | Freq: Once | OROMUCOSAL | Status: AC

## 2021-11-01 MED ORDER — KETOROLAC TROMETHAMINE 15 MG/ML IJ SOLN
15.0000 mg | INTRAMUSCULAR | Status: AC
  Administered 2021-11-01: 15 mg via INTRAVENOUS

## 2021-11-01 MED ORDER — FENTANYL CITRATE (PF) 250 MCG/5ML IJ SOLN
INTRAMUSCULAR | Status: AC
  Filled 2021-11-01: qty 5

## 2021-11-01 SURGICAL SUPPLY — 26 items
ADH SKN CLS APL DERMABOND .7 (GAUZE/BANDAGES/DRESSINGS)
ADH SKN CLS LQ APL DERMABOND (GAUZE/BANDAGES/DRESSINGS) ×1
COTTONBALL LRG STERILE PKG (GAUZE/BANDAGES/DRESSINGS) ×1 IMPLANT
COVER SURGICAL LIGHT HANDLE (MISCELLANEOUS) ×1 IMPLANT
DERMABOND ADVANCED .7 DNX12 (GAUZE/BANDAGES/DRESSINGS) ×1 IMPLANT
DERMABOND ADVANCED .7 DNX6 (GAUZE/BANDAGES/DRESSINGS) IMPLANT
DRAPE LAPAROTOMY T 98X78 PEDS (DRAPES) ×1 IMPLANT
DRSG TEGADERM 4X4.75 (GAUZE/BANDAGES/DRESSINGS) ×1 IMPLANT
ELECT REM PT RETURN 15FT ADLT (MISCELLANEOUS) ×1 IMPLANT
GLOVE BIOGEL PI IND STRL 8 (GLOVE) ×1 IMPLANT
GOWN STRL REUS W/ TWL XL LVL3 (GOWN DISPOSABLE) ×1 IMPLANT
GOWN STRL REUS W/TWL XL LVL3 (GOWN DISPOSABLE) ×1
KIT BASIN OR (CUSTOM PROCEDURE TRAY) ×1 IMPLANT
KIT TURNOVER KIT A (KITS) IMPLANT
NEEDLE HYPO 22GX1.5 SAFETY (NEEDLE) ×1 IMPLANT
NS IRRIG 1000ML POUR BTL (IV SOLUTION) ×1 IMPLANT
PACK GENERAL/GYN (CUSTOM PROCEDURE TRAY) ×1 IMPLANT
SPIKE FLUID TRANSFER (MISCELLANEOUS) ×1 IMPLANT
SUT MNCRL AB 4-0 PS2 18 (SUTURE) ×1 IMPLANT
SUT NOVA 0 T19/GS 22DT (SUTURE) IMPLANT
SUT NOVA NAB GS-21 0 18 T12 DT (SUTURE) IMPLANT
SUT PDS AB 2-0 CT2 27 (SUTURE) IMPLANT
SUT VIC AB 3-0 SH 8-18 (SUTURE) ×1 IMPLANT
SYR CONTROL 10ML LL (SYRINGE) ×1 IMPLANT
TOWEL OR 17X26 10 PK STRL BLUE (TOWEL DISPOSABLE) ×1 IMPLANT
TRAY FOLEY MTR SLVR 16FR STAT (SET/KITS/TRAYS/PACK) IMPLANT

## 2021-11-01 NOTE — Anesthesia Postprocedure Evaluation (Signed)
Anesthesia Post Note  Patient: Justin Young  Procedure(s) Performed: OPEN UMBILICAL HERNIA REPAIR (Abdomen)     Anesthesia Post Evaluation No notable events documented.  Last Vitals:  Vitals:   11/01/21 0626  BP: (!) 146/91  Pulse: 69  Resp: 16  Temp: 36.7 C  SpO2: 97%    Last Pain:  Vitals:   11/01/21 0652  TempSrc:   PainSc: 0-No pain                 Mayte Diers

## 2021-11-01 NOTE — Discharge Instructions (Signed)
 VENTRAL HERNIA REPAIR POST OPERATIVE INSTRUCTIONS  Thinking Clearly  The anesthesia may cause you to feel different for 1 or 2 days. Do not drive, drink alcohol, or make any big decisions for at least 2 days.  Nutrition When you wake up, you will be able to drink small amounts of liquid. If you do not feel sick, you can slowly advance your diet to regular foods. Continue to drink lots of fluids, usually about 8 to 10 glasses per day. Eat a high-fiber diet so you don't strain during bowel movements. High-Fiber Foods Foods high in fiber include beans, bran cereals and whole-grain breads, peas, dried fruit (figs, apricots, and dates), raspberries, blackberries, strawberries, sweet corn, broccoli, baked potatoes with skin, plums, pears, apples, greens, and nuts. Activity Slowly increase your activity. Be sure to get up and walk every hour or so to prevent blood clots. No heavy lifting or strenuous activity for 4 weeks following surgery to prevent hernias at your incision sites or recurrence of your hernia. It is normal to feel tired. You may need more sleep than usual.  Get your rest but make sure to get up and move around frequently to prevent blood clots and pneumonia.  Work and Return to School You can go back to work when you feel well enough. Discuss the timing with your surgeon. You can usually go back to school or work 1 week or less after an laparoscopic or an open repair. If your work requires heavy lifting or strenuous activity you need to be placed on light duty for 4 weeks following surgery. You can return to gym class, sports or other physical activities 4 weeks after surgery.  Wound Care You may experience significant bruising throughout the abdominal wall that may track down into the groin including into the scrotum in males.  Rest, elevating the groin and scrotum above the level of the heart, ice and compression with tight fitting underwear or an abdominal binder can help.   Always wash your hands before and after touching near your incision site. Do not soak in a bathtub until cleared at your follow up appointment. You may take a shower 24 hours after surgery. A small amount of drainage from the incision is normal. If the drainage is thick and yellow or the site is red, you may have an infection, so call your surgeon. If you have a drain in one of your incisions, it will be taken out in office when the drainage stops. Steri-Strips will fall off in 7 to 10 days or they will be removed during your first office visit. If you have dermabond glue covering over the incision, allow the glue to flake off on its own. Protect the new skin, especially from the sun. The sun can burn and cause darker scarring. Your scar will heal in about 4 to 6 weeks and will become softer and continue to fade over the next year.  The cosmetic appearance of the incisions will improve over the course of the first year after surgery. Sensation around your incision will return in a few weeks or months.  Bowel Movements After intestinal surgery, you may have loose watery stools for several days. If watery diarrhea lasts longer than 3 days, contact your surgeon. Pain medication (narcotics) can cause constipation. Increase the fiber in your diet with high-fiber foods if you are constipated. You can take an over the counter stool softener like Colace to avoid constipation.  Additional over the counter medications can also be used   if Colace isn't sufficient (for example, Milk of Magnesia or Miralax).  Pain The amount of pain is different for each person. Some people need only 1 to 3 doses of pain control medication, while others need more. Take alternating doses of tylenol and ibuprofen around the clock for the first five days following surgery.  This will provide a baseline of pain control and help with inflammation.  Take the narcotic pain medication in addition if needed for severe pain.  Contact  Your Surgeon at [phone], if you have: Pain that will not go away Pain that gets worse A fever of more than 101F (38.3C) Repeated vomiting Swelling, redness, bleeding, or bad-smelling drainage from your wound site Strong abdominal pain No bowel movement or unable to pass gas for 3 days Watery diarrhea lasting longer than 3 days  Pain Control The goal of pain control is to minimize pain, keep you moving and help you heal. Your surgical team will work with you on your pain plan. Most often a combination of therapies and medications are used to control your pain. You may also be given medication (local anesthetic) at the surgical site. This may help control your pain for several days. Extreme pain puts extra stress on your body at a time when your body needs to focus on healing. Do not wait until your pain has reached a level "10" or is unbearable before telling your doctor or nurse. It is much easier to control pain before it becomes severe. Following a laparoscopic procedure, pain is sometimes felt in the shoulder. This is due to the gas inserted into your abdomen during the procedure. Moving and walking helps to decrease the gas and the right shoulder pain.  Use the guide below for ways to manage your post-operative pain. Learn more by going to facs.org/safepaincontrol.  How Intense Is My Pain Common Therapies to Feel Better       I hardly notice my pain, and it does not interfere with my activities.  I notice my pain and it distracts me, but I can still do activities (sitting up, walking, standing).  Non-Medication Therapies  Ice (in a bag, applied over clothing at the surgical site), elevation, rest, meditation, massage, distraction (music, TV, play) walking and mild exercise Splinting the abdomen with pillows +  Non-Opioid Medications Acetaminophen (Tylenol) Non-steroidal anti-inflammatory drugs (NSAIDS) Aspirin, Ibuprofen (Motrin, Advil) Naproxen (Aleve) Take these as  needed, when you feel pain. Both acetaminophen and NSAIDs help to decrease pain and swelling (inflammation).      My pain is hard to ignore and is more noticeable even when I rest.  My pain interferes with my usual activities.  Non-Medication Therapies  +  Non-Opioid medications  Take on a regular schedule (around-the-clock) instead of as needed. (For example, Tylenol every 6 hours at 9:00 am, 3:00 pm, 9:00 pm, 3:00 am and Motrin every 6 hours at 12:00 am, 6:00 am, 12:00 pm, 6:00 pm)         I am focused on my pain, and I am not doing my daily activities.  I am groaning in pain, and I cannot sleep. I am unable to do anything.  My pain is as bad as it could be, and nothing else matters.  Non-Medication Therapies  +  Around-the-Clock Non-Opioid Medications  +  Short-acting opioids  Opioids should be used with other medications to manage severe pain. Opioids block pain and give a feeling of euphoria (feel high). Addiction, a serious side effect of opioids, is   rare with short-term (a few days) use.  Examples of short-acting opioids include: Tramadol (Ultram), Hydrocodone (Norco, Vicodin), Hydromorphone (Dilaudid), Oxycodone (Oxycontin)     The above directions have been adapted from the American College [employer] Surgical Patient Education Program.  Please refer to the ACS website if needed: https://www.facs.org/-/media/files/education/patient-ed/adultumbilical.ashx   Avereigh Spainhower, MD [facility] [address], [address], [place], [place]  [address] ?  [address] [address], [place],    [address] [phone] ? [phone] ? FAX [phone] Web site: [email or web address]  

## 2021-11-01 NOTE — Transfer of Care (Signed)
Immediate Anesthesia Transfer of Care Note  Patient: HESHAM WOMAC  Procedure(s) Performed: OPEN UMBILICAL HERNIA REPAIR (Abdomen)  Patient Location: PACU  Anesthesia Type:General  Level of Consciousness: sedated  Airway & Oxygen Therapy: Patient Spontanous Breathing and Patient connected to face mask oxygen  Post-op Assessment: Report given to RN and Post -op Vital signs reviewed and stable  Post vital signs: Reviewed and stable  Last Vitals:  Vitals Value Taken Time  BP 164/88 11/01/21 0902  Temp    Pulse 61 11/01/21 0904  Resp 19 11/01/21 0904  SpO2 97 % 11/01/21 0904  Vitals shown include unvalidated device data.  Last Pain:  Vitals:   11/01/21 0652  TempSrc:   PainSc: 0-No pain         Complications: No notable events documented.

## 2021-11-01 NOTE — Anesthesia Procedure Notes (Signed)
Procedure Name: Intubation Date/Time: 11/01/2021 8:02 AM  Performed by: Cynda Familia, CRNAPre-anesthesia Checklist: Patient identified, Emergency Drugs available, Suction available and Patient being monitored Patient Re-evaluated:Patient Re-evaluated prior to induction Oxygen Delivery Method: Circle System Utilized Preoxygenation: Pre-oxygenation with 100% oxygen Induction Type: IV induction Ventilation: Mask ventilation without difficulty Laryngoscope Size: Miller and 2 Grade View: Grade I Tube type: Oral Tube size: 7.5 mm Number of attempts: 1 Airway Equipment and Method: Stylet Placement Confirmation: ETT inserted through vocal cords under direct vision, positive ETCO2 and breath sounds checked- equal and bilateral Secured at: 22 cm Tube secured with: Tape Dental Injury: Teeth and Oropharynx as per pre-operative assessment  Comments: Smooth IV induction Finucane-- intubation AM CRNA atraumatic -teeth and mouth as preop -- bilat BS

## 2021-11-01 NOTE — Addendum Note (Signed)
Addendum  created 11/01/21 0944 by Pervis Hocking, DO   Order list changed, Pharmacy for encounter modified

## 2021-11-01 NOTE — Anesthesia Procedure Notes (Signed)
Date/Time: 11/01/2021 9:07 AM  Performed by: Cynda Familia, CRNAOxygen Delivery Method: Simple face mask Placement Confirmation: positive ETCO2 and breath sounds checked- equal and bilateral Dental Injury: Teeth and Oropharynx as per pre-operative assessment

## 2021-11-01 NOTE — H&P (Signed)
Admitting Physician: Hyman Hopes Jaelyn Bourgoin  Service: General Surgery  CC: Hernia  Subjective   HPI: Justin Young is an 61 y.o. male who is here for umbilical hernia repair  Past Medical History:  Diagnosis Date   Arthritis    GERD (gastroesophageal reflux disease)    mild -rolaids if necessary   Hypertension    Insomnia    Paroxysmal atrial fibrillation (HCC)    Spinal stenosis at L4-L5 level    Syncope 2020    Past Surgical History:  Procedure Laterality Date   ANKLE RECONSTRUCTION  left   3 procedures total   ATRIAL FIBRILLATION ABLATION N/A 06/29/2021   Procedure: ATRIAL FIBRILLATION ABLATION;  Surgeon: Lanier Prude, MD;  Location: [facility];  Service: Cardiovascular;  Laterality: N/A;   CLAVICLE SURGERY     bilateral   KNEE ARTHROSCOPY  2006-most current   left knee X 2 1 ON RIGHT   KNEE ARTHROSCOPY WITH MEDIAL MENISECTOMY  01/27/2012   Procedure: KNEE ARTHROSCOPY WITH MEDIAL MENISECTOMY;  Surgeon: Jacki Cones, MD;  Location: [facility];  Service: Orthopedics;  Laterality: Left;   LUMBAR LAMINECTOMY/DECOMPRESSION MICRODISCECTOMY N/A 03/16/2016   Procedure: CENTRAL DECOMPRESSIVE LUMBAR LAMINECTOMY LUMBAR FOUR TO LUMBAR FIVE;  Surgeon: Ranee Gosselin, MD;  Location: [facility];  Service: Orthopedics;  Laterality: N/A;   TEE WITHOUT CARDIOVERSION  06/29/2021   Procedure: TRANSESOPHAGEAL ECHOCARDIOGRAM (TEE);  Surgeon: Lanier Prude, MD;  Location: [facility] INVASIVE CV LAB;  Service: Cardiovascular;;   TONSILLECTOMY  AS CHILD    Family History  Problem Relation Age of Onset   Arthritis Mother    Cancer Mother        Ovarian cancer primary,colon cancer.   Diabetes Mother    Colon cancer Mother    Alcohol abuse Father    Hyperlipidemia Father    Hypertension Father    Heart disease Father    Heart disease Sister    Hypertension Sister    Sleep apnea Sister    Arthritis Maternal Grandmother     Social:  reports that he quit  smoking about 14 years ago. His smoking use included cigarettes. He has a 90.00 pack-year smoking history. He has never used smokeless tobacco. He reports current alcohol use of about 7.0 standard drinks of alcohol per week. He reports that he does not use drugs.  Allergies:  Allergies  Allergen Reactions   Amlodipine Swelling and Other (See Comments)    Water retention   Sulfa Antibiotics Other (See Comments)    Unknown reaction    Codeine Itching    Medications: Current Outpatient Medications  Medication Instructions   clonazePAM (KLONOPIN) 0.5-1 mg, Oral, At bedtime PRN   Eliquis 5 mg, Oral, 2 times daily   loratadine (CLARITIN) 10 mg, Oral, Daily   losartan (COZAAR) 50 mg, Oral, Daily   metoprolol tartrate (LOPRESSOR) 12.5 mg, Oral, 2 times daily   omeprazole (PRILOSEC) 20 mg, Oral, See admin instructions, Take 20 mg daily, may take a second 20 mg dose as needed for heartburn     ROS - all of the below systems have been reviewed with the patient and positives are indicated with bold text General: chills, fever or night sweats Eyes: blurry vision or double vision ENT: epistaxis or sore throat Allergy/Immunology: itchy/watery eyes or nasal congestion Hematologic/Lymphatic: bleeding problems, blood clots or swollen lymph nodes Endocrine: temperature intolerance or unexpected weight changes Breast: new or changing breast lumps or nipple discharge Resp: cough, shortness of  breath, or wheezing CV: chest pain or dyspnea on exertion GI: as per HPI GU: dysuria, trouble voiding, or hematuria MSK: joint pain or joint stiffness Neuro: TIA or stroke symptoms Derm: pruritus and skin lesion changes Psych: anxiety and depression  Objective   PE There were no vitals taken for this visit. Constitutional: NAD; conversant; no deformities Eyes: Moist conjunctiva; no lid lag; anicteric; PERRL Neck: Trachea midline; no thyromegaly Lungs: Normal respiratory effort; no tactile fremitus CV:  RRR; no palpable thrills; no pitting edema GI: Abd Umbilical hernia; no palpable hepatosplenomegaly MSK: Normal range of motion of extremities; no clubbing/cyanosis Psychiatric: Appropriate affect; alert and oriented x3 Lymphatic: No palpable cervical or axillary lymphadenopathy  No results found for this or any previous visit (from the past 24 hour(s)).  Imaging Orders  No imaging studies ordered today   CT Renal Stone Study 04/30/21  1. 1 mm calculus in the distal right ureter near the ureterovesical junction on the right. Localized distal ureteral edema on the right noted in this area. There is moderate hydronephrosis on the right as well as perinephric soft tissue stranding and fluid on the right.  2. Sigmoid diverticula without diverticulitis. No bowel obstruction. No abscess in the abdomen or pelvis. Appendix appears normal.  3. Aortic Atherosclerosis (ICD10-I70.0).  4. Small umbilical hernia containing fat and slight changes of panniculitis. No bowel containing hernia evident.   Assessment and Plan   Justin Young is an 61 y.o. male with 1cm x 1cm umbilical hernia on CT.  I recommend open umbilical hernia repair, possibly with mesh. The procedure itself as well as its risk, benefits, and alternatives were discussed with patient in full. After full discussion all questions answered the patient granted consent to proceed.  Felicie Morn, MD  [facility] Use [email or web address] to contact on call provider

## 2021-11-01 NOTE — Op Note (Signed)
   Patient: Justin Young (1960/07/14, [id number])  Date of Surgery: 11/01/2021   Preoperative Diagnosis: UMBILICAL HERNIA   Postoperative Diagnosis: UMBILICAL HERNIA   Surgical Procedure: OPEN UMBILICAL HERNIA REPAIR:    Operative Team Members:  Surgeon(s) and Role:    * Viola Kinnick, Nickola Major, MD - Primary   Anesthesiologist: Pervis Hocking, DO CRNA: Cynda Familia, CRNA   Anesthesia: General   Fluids:  No intake/output data recorded.  Complications: None  Drains:  none   Specimen: None   Disposition:  PACU - hemodynamically stable.  Plan of Care: Discharge to home after PACU    Indications for Procedure: [name] is a 61 y.o. male who presented with an umbilical hernia.  I recommended open repair.  The procedure, risks, benefits and alternatives were discussed and the patient granted consent to proceed.  Findings:  Hernia Location: Umbilical  Hernia Size:  1cm wide x 0.5 cm tall  Mesh Size &Type:  None Mesh Position: None  Description of Procedure:  The patient was positioned supine, padded and secured to the bed.  The abdomen was widely prepped and draped.  A time out procedure was performed.    A curvilinear incision was made below the umbilicus and dissection was carried down through the subcutaneous tissue to the level of the fascia.  The umbilical stalk was encircled and the hernia sac amputated off the umbilical skin.  The hernia defect was measured as 1 cm wide by 0.5 cm in vertical dimension.    A sutured umbilical hernia repair was performed utilizing figure of eight 0-novafil suture. The defect was closed horizontally under no tension.  The wound was irrigated with saline.  The umbilical skin was tacked down to the fascial repair with 4-0 Monocryl suture.  The skin was closed with 4-0 Monocryl subcuticular suture and skin glue.     [name], MD General, Bariatric, & Minimally Invasive Surgery [facility], [place]

## 2021-11-02 ENCOUNTER — Encounter (HOSPITAL_COMMUNITY): Payer: Self-pay | Admitting: Surgery

## 2022-02-24 ENCOUNTER — Encounter (HOSPITAL_COMMUNITY): Payer: Self-pay | Admitting: *Deleted

## 2022-05-07 ENCOUNTER — Other Ambulatory Visit (HOSPITAL_BASED_OUTPATIENT_CLINIC_OR_DEPARTMENT_OTHER): Payer: Self-pay | Admitting: Cardiology

## 2022-05-07 DIAGNOSIS — I10 Essential (primary) hypertension: Secondary | ICD-10-CM

## 2022-06-08 ENCOUNTER — Telehealth: Payer: Self-pay

## 2022-06-08 NOTE — Telephone Encounter (Signed)
Pharmacy please advise on holding Eliquis prior to stitch removal from hernia repair scheduled for TBD. Thank you.

## 2022-06-08 NOTE — Telephone Encounter (Signed)
   Pre-operative Risk Assessment    Patient Name: Justin Young  DOB: [birth year] MRN: [id number]      Request for Surgical Clearance    Procedure:   stitch removal  Date of Surgery:  Clearance TBD                                 Surgeon:  Ivar Drape, MD Surgeon's Group or Practice Name:  [facility] Phone number:  [phone] Fax number:  [phone] ATTN: Trellis Moment   Type of Clearance Requested:   - Medical  - Pharmacy:  Hold Apixaban (Eliquis) pt will need instructions on when/if to hold   Type of Anesthesia:  General    Additional requests/questions:    Wynetta Fines   06/08/2022, 4:53 PM

## 2022-06-09 ENCOUNTER — Telehealth: Payer: Self-pay | Admitting: *Deleted

## 2022-06-09 NOTE — Telephone Encounter (Signed)
Shouldn't need to hold anticoag for stitch removal.

## 2022-06-09 NOTE — Telephone Encounter (Signed)
Pt has been added to pre op schedule 06/15/22 @ 10:40. Pt tells me that he has stitches and that are popping out of his incision from his recent surgery. Pt said he cannot wait until June 3rd for the tele appt as that was our first tele appt. Med rec and consent are done. Pt is grateful for our help in this matter.     Patient Consent for Virtual Visit        Justin Young has provided verbal consent on 06/09/2022 for a virtual visit (video or telephone).   CONSENT FOR VIRTUAL VISIT FOR:  Justin Young  By participating in this virtual visit I agree to the following:  I hereby voluntarily request, consent and authorize [facility] and its employed or contracted physicians, physician assistants, nurse practitioners or other licensed health care professionals (the Practitioner), to provide me with telemedicine health care services (the "Services") as deemed necessary by the treating Practitioner. I acknowledge and consent to receive the Services by the Practitioner via telemedicine. I understand that the telemedicine visit will involve communicating with the Practitioner through live audiovisual communication technology and the disclosure of certain medical information by electronic transmission. I acknowledge that I have been given the opportunity to request an in-person assessment or other available alternative prior to the telemedicine visit and am voluntarily participating in the telemedicine visit.  I understand that I have the right to withhold or withdraw my consent to the use of telemedicine in the course of my care at any time, without affecting my right to future care or treatment, and that the Practitioner or I may terminate the telemedicine visit at any time. I understand that I have the right to inspect all information obtained and/or recorded in the course of the telemedicine visit and may receive copies of available information for a reasonable fee.  I understand that some of  the potential risks of receiving the Services via telemedicine include:  Delay or interruption in medical evaluation due to technological equipment failure or disruption; Information transmitted may not be sufficient (e.g. poor resolution of images) to allow for appropriate medical decision making by the Practitioner; and/or  In rare instances, security protocols could fail, causing a breach of personal health information.  Furthermore, I acknowledge that it is my responsibility to provide information about my medical history, conditions and care that is complete and accurate to the best of my ability. I acknowledge that Practitioner's advice, recommendations, and/or decision may be based on factors not within their control, such as incomplete or inaccurate data provided by me or distortions of diagnostic images or specimens that may result from electronic transmissions. I understand that the practice of medicine is not an exact science and that Practitioner makes no warranties or guarantees regarding treatment outcomes. I acknowledge that a copy of this consent can be made available to me via my patient portal [facility] MyChart), or I can request a printed copy by calling the office of [facility].    I understand that my insurance will be billed for this visit.   I have read or had this consent read to me. I understand the contents of this consent, which adequately explains the benefits and risks of the Services being provided via telemedicine.  I have been provided ample opportunity to ask questions regarding this consent and the Services and have had my questions answered to my satisfaction. I give my informed consent for the services to be provided through the  use of telemedicine in my medical care

## 2022-06-09 NOTE — Telephone Encounter (Signed)
   Name: Justin Young  DOB: [birth year]  MRN: [id number]  Primary Cardiologist: Jodelle Red, MD   Preoperative team, please contact this patient and set up a phone call appointment for further preoperative risk assessment. Please obtain consent and complete medication review. Thank you for your help.  I confirm that guidance regarding antiplatelet and oral anticoagulation therapy has been completed and, if necessary, noted below.  Per pharmacy patient will not need to hold anticoagulation for stitch removal.   Napoleon Form, Leodis Rains, NP 06/09/2022, 3:03 PM [facility]

## 2022-06-09 NOTE — Telephone Encounter (Signed)
Pt has been added to pre op schedule 06/15/22 @ 10:40. Pt tells me that he has stitches and that are popping out of his incision from his recent surgery. Pt said he cannot wait until June 3rd for the tele appt as that was our first tele appt. Med rec and consent are done. Pt is grateful for our help in this matter.

## 2022-06-10 NOTE — Progress Notes (Unsigned)
Cardiology Office Note:    Date:  06/14/2022   ID:  Justin Young, DOB [birth year], MRN [id number]  PCP:  [name]   [facility] Providers Cardiologist:  Jodelle Red, MD Electrophysiologist:  Lanier Prude, MD  Sleep Medicine:  Armanda Magic, MD   Referring MD: Roe Coombs*   Chief Complaint  Patient presents with   Pre-op Exam    History of Present Illness:    Justin Young is a 62 y.o. male with a hx of atrial fibrillation s/p ablation in 06/2021, HTN, OSA, GERD. Patient is followed by Dr. Cristal Deer and Dr. Lalla Brothers and presents today for preoperative evaluation   Per chart review, patient established care with [facility] in 06/2020 when he presented to the ED complaining of palpitations and was found to be in new onset atrial fibrillation. He was started on anticoagulation and scheduled for TEE/DCCV, but he converted to NSR spontaneously. Echocardiogram on 07/29/20 showed EF 55-60%, no regional wall motion abnormalities, grade I diastolic dysfunction, normal RV systolic function. He was treated with flecainide, but he did not tolerate high doses well due to side effects. Eventually, he was referred to EP and underwent atrial fibrillation ablation on 06/29/21. After DCCV, he had some atypical chest pain and he underwent nuclear stress test 07/16/21 that was a normal, low risk study without evidence of ischemia. EF was 56%. He was last seen by cardiology on 09/27/21. He was doing well and denied SOB, lightheadedness, headaches, syncope. He did have brief episodes of "quick flutters", but he did not have any sustained arrhythmic episodes.   Today, patient presents for preoperative evaluation prior to stitch removal. He had an umbilical hernia repair in 10/2021, and now a novafil suture is stabbing the underside of his umbilicus. He is in the process of scheduling the procedure to remove the stitch. Reports that he has been  doing very well from a cardiac perspective. He recently started [facility], and has lost about 25 lbs. Since losing weight, he has been feeling more energetic. He also has stopped snoring, and he sleeps well throughout the night. His activities are never limited by chest pain or shortness of breath. He rarely has brief palpitations that feel like his heart skipped a beat. Denies sustained episodes of palpitations/fast heart beats. Denies bleeding issues on eliquis    Past Medical History:  Diagnosis Date   Arthritis    GERD (gastroesophageal reflux disease)    mild -rolaids if necessary   Hypertension    Insomnia    Paroxysmal atrial fibrillation (HCC)    Spinal stenosis at L4-L5 level    Syncope 2020    Past Surgical History:  Procedure Laterality Date   ANKLE RECONSTRUCTION  left   3 procedures total   ATRIAL FIBRILLATION ABLATION N/A 06/29/2021   Procedure: ATRIAL FIBRILLATION ABLATION;  Surgeon: Lanier Prude, MD;  Location: [facility] INVASIVE CV LAB;  Service: Cardiovascular;  Laterality: N/A;   CLAVICLE SURGERY     bilateral   KNEE ARTHROSCOPY  2006-most current   left knee X 2 1 ON RIGHT   KNEE ARTHROSCOPY WITH MEDIAL MENISECTOMY  01/27/2012   Procedure: KNEE ARTHROSCOPY WITH MEDIAL MENISECTOMY;  Surgeon: Jacki Cones, MD;  Location: [facility] [place];  Service: Orthopedics;  Laterality: Left;   LUMBAR LAMINECTOMY/DECOMPRESSION MICRODISCECTOMY N/A 03/16/2016   Procedure: CENTRAL DECOMPRESSIVE LUMBAR LAMINECTOMY LUMBAR FOUR TO LUMBAR FIVE;  Surgeon: Ranee Gosselin, MD;  Location: [facility] ORS;  Service: Orthopedics;  Laterality: N/A;  TEE WITHOUT CARDIOVERSION  06/29/2021   Procedure: TRANSESOPHAGEAL ECHOCARDIOGRAM (TEE);  Surgeon: Lanier Prude, MD;  Location: [facility] INVASIVE CV LAB;  Service: Cardiovascular;;   TONSILLECTOMY  AS CHILD   UMBILICAL HERNIA REPAIR N/A 11/01/2021   Procedure: OPEN UMBILICAL HERNIA REPAIR;  Surgeon: Quentin Ore, MD;  Location: [facility] ORS;   Service: General;  Laterality: N/A;    Current Medications: Current Meds  Medication Sig   ferrous sulfate 325 (65 FE) MG tablet Take 325 mg by mouth daily.   hydrochlorothiazide (HYDRODIURIL) 12.5 MG tablet Take 12.5 mg by mouth daily.   Semaglutide-Weight Management (WEGOVY) 1.7 MG/0.75ML SOAJ Inject 1.7 mg into the skin once a week.     Allergies:   Amlodipine, Sulfa antibiotics, and Codeine   Social History   Socioeconomic History   Marital status: Married    Spouse name: Gavin Pound   Number of children: 3   Years of education: 18   Highest education level: Not on file  Occupational History   Occupation: Careers adviser: [employer]    Comment: self-employed  Tobacco Use   Smoking status: Former    Packs/day: 3.00    Years: 30.00    Additional pack years: 0.00    Total pack years: 90.00    Types: Cigarettes    Quit date: 01/25/2007    Years since quitting: 15.3   Smokeless tobacco: Never   Tobacco comments:    Former smoker 11/02/2020  Vaping Use   Vaping Use: Never used  Substance and Sexual Activity   Alcohol use: Yes    Alcohol/week: 7.0 standard drinks of alcohol    Types: 7 Shots of liquor per week    Comment: 1-3 drinks per day   Drug use: No   Sexual activity: Yes    Partners: Female  Other Topics Concern   Not on file  Social History Narrative   [employer], [employer] - BS [place], [employer] -misc business classes.Married '84 wife Gavin Pound: 3 kids - g b g: '86, '88, '95. 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  ROS:   Please see the history of present illness.     All other systems reviewed and are negative.  EKGs/Labs/Other Studies Reviewed:    The following studies were reviewed today: Cardiac Studies & Procedures     STRESS TESTS  MYOCARDIAL PERFUSION IMAGING 07/16/2021  Narrative   The study is normal. The study is low risk.   No ST deviation was noted.   LV perfusion is normal. There is no evidence of ischemia. There is no evidence of infarction.   Left ventricular function is normal. Nuclear stress EF: 56 %. The left ventricular ejection fraction is normal (55-65%). End diastolic cavity size is mildly enlarged.   Prior study  not available for comparison.   ECHOCARDIOGRAM  ECHOCARDIOGRAM COMPLETE 07/29/2020  Narrative ECHOCARDIOGRAM REPORT    Patient Name:   [facility] Date of Exam: 07/29/2020 Medical Rec #:  [id number]       Height:       70.0 in Accession #:    [id number]      Weight:       225.0 lb Date of Birth:  01-Apr-1960        BSA:          2.194 m Patient Age:    60 years        BP:           130/82 mmHg Patient Gender: M               HR:           53 bpm. Exam Location:  [address]  Procedure: 2D Echo, 3D Echo, Cardiac Doppler, Color Doppler and Strain Analysis  Indications:    R06.00 Dyspnea on exertion  History:        Patient has prior history of Echocardiogram examinations, most recent 06/05/2017. TIA, Signs/Symptoms:Orthostatic hypotension and Shortness of Breath; Risk Factors:Hypertension, Dyslipidemia and Former Smoker. COVID-19 09/03/2019. Pre-diabetes.  Sonographer:    Garald Braver, RDCS Referring Phys: [id number] CAITLIN S WALKER  IMPRESSIONS   1. Left ventricular ejection  fraction, by estimation, is 55 to 60%. Left ventricular ejection fraction by 3D volume is 59 %. The left ventricle has normal function. The left ventricle has no regional wall motion abnormalities. There is mild asymmetric left ventricular hypertrophy of the basal-septal segment. Left ventricular diastolic parameters are consistent with Grade I diastolic dysfunction (impaired relaxation). The average left ventricular global longitudinal strain is -24.6 %. The global longitudinal strain is normal. 2. Right ventricular systolic function is normal. The right ventricular size is normal. Tricuspid regurgitation signal is inadequate for assessing PA pressure. 3. The mitral valve is normal in structure. Trivial mitral valve regurgitation. No evidence of mitral stenosis. 4. The aortic valve is tricuspid. There is mild calcification of the aortic valve. There is mild thickening of the aortic valve. Aortic valve regurgitation is not visualized. Mild aortic valve sclerosis is present, with no evidence of aortic valve stenosis. 5. Aortic dilatation noted. There is mild dilatation of the aortic root, measuring 39 mm. There is mild dilatation of the ascending aorta, measuring 39 mm. 6. The inferior vena cava is normal in size with greater than 50% respiratory variability, suggesting right atrial pressure of 3 mmHg.  Comparison(s): No significant change from prior study. 06/05/17 EF 55-60%.  FINDINGS Left Ventricle: Left ventricular ejection fraction, by estimation, is 55 to 60%. Left ventricular ejection fraction by 3D volume is 59 %. The left ventricle has normal function. The left ventricle has no regional wall motion abnormalities. The average left ventricular global longitudinal strain is -24.6 %. The global longitudinal strain is normal. The left ventricular internal cavity size was normal in size. There is mild asymmetric left ventricular hypertrophy of the basal-septal segment. Left ventricular diastolic  parameters are consistent with Grade I diastolic dysfunction (impaired relaxation).  Right Ventricle: The right ventricular size is normal. No increase in right ventricular wall thickness. Right ventricular systolic function is normal. Tricuspid regurgitation signal is inadequate for assessing PA pressure.  Left Atrium: Left atrial size was normal in size.  Right Atrium: Right atrial size was normal in size.  Pericardium: There is no evidence of pericardial effusion.  Mitral Valve:  The mitral valve is normal in structure. There is mild thickening of the mitral valve leaflet(s). Trivial mitral valve regurgitation. No evidence of mitral valve stenosis.  Tricuspid Valve: The tricuspid valve is normal in structure. Tricuspid valve regurgitation is trivial.  Aortic Valve: The aortic valve is tricuspid. There is mild calcification of the aortic valve. There is mild thickening of the aortic valve. Aortic valve regurgitation is not visualized. Mild aortic valve sclerosis is present, with no evidence of aortic valve stenosis.  Pulmonic Valve: The pulmonic valve was not well visualized. Pulmonic valve regurgitation is not visualized.  Aorta: Aortic dilatation noted. There is mild dilatation of the aortic root, measuring 39 mm. There is mild dilatation of the ascending aorta, measuring 39 mm.  Venous: The inferior vena cava is normal in size with greater than 50% respiratory variability, suggesting right atrial pressure of 3 mmHg.  IAS/Shunts: No atrial level shunt detected by color flow Doppler.   LEFT VENTRICLE PLAX 2D LVIDd:         5.60 cm         Diastology LVIDs:         3.60 cm         LV e' medial:    5.50 cm/s LV PW:         1.10 cm         LV E/e' medial:  10.1 LV IVS:        1.00 cm         LV e' lateral:   8.79 cm/s LVOT diam:     2.40 cm         LV E/e' lateral: 6.3 LV SV:         126 LV SV Index:   58              2D LVOT Area:     4.52 cm        Longitudinal Strain 2D Strain  GLS  -21.9 % (A2C): 2D Strain GLS  -26.7 % (A3C): 2D Strain GLS  -25.2 % (A4C): 2D Strain GLS  -24.6 % Avg:  3D Volume EF LV 3D EF:    Left ventricular ejection fraction by 3D volume is 59 %.  3D Volume EF: 3D EF:        59 % LV EDV:       207 ml LV ESV:       85 ml LV SV:        122 ml  RIGHT VENTRICLE RV Basal diam:  3.20 cm RV S prime:     11.60 cm/s TAPSE (M-mode): 2.2 cm  LEFT ATRIUM             Index       RIGHT ATRIUM           Index LA diam:        3.70 cm 1.69 cm/m  RA Pressure: 3.00 mmHg LA Vol (A2C):   76.1 ml 34.68 ml/m RA Area:     13.60 cm LA Vol (A4C):   46.3 ml 21.10 ml/m RA Volume:   31.60 ml  14.40 ml/m LA Biplane Vol: 64.8 ml 29.53 ml/m AORTIC VALVE LVOT Vmax:   132.00 cm/s LVOT Vmean:  76.000 cm/s LVOT VTI:    0.279 m  AORTA Ao Root diam: 4.00 cm Ao Asc diam:  3.90 cm  MITRAL VALVE               TRICUSPID VALVE Estimated RAP:  3.00 mmHg  MV E velocity: 55.70 cm/s  SHUNTS MV A velocity: 60.80 cm/s  Systemic VTI:  0.28 m MV E/A ratio:  0.92        Systemic Diam: 2.40 cm  Laurance Flatten MD Electronically signed by Laurance Flatten MD Signature Date/Time: 07/29/2020/4:52:41 PM    Final   TEE  ECHO TEE 06/29/2021  Narrative TRANSESOPHOGEAL ECHO REPORT    Patient Name:   Justin Young [facility] Date of Exam: 06/29/2021 Medical Rec #:  [id number]       Height:       70.0 in Accession #:    [id number]      Weight:       214.0 lb Date of Birth:  February 22, 1960        BSA:          2.148 m Patient Age:    60 years        BP:           174/89 mmHg Patient Gender: M               HR:           60 bpm. Exam Location:  Inpatient  Procedure: Transesophageal Echo and Color Doppler  Indications:     Aortic Dissection  History:         Patient has prior history of Echocardiogram examinations, most recent 07/29/2020. Arrythmias:Atrial Fibrillation, Signs/Symptoms:Syncope; Risk Factors:GERD and Hypertension.  Sonographer:     Eulah Pont  RDCS Referring Phys:  [id number] Lanier Prude Diagnosing Phys: Olga Millers MD  PROCEDURE: After discussion of the risks and benefits of a TEE, an informed consent was obtained from the patient. The transesophogeal probe was passed without difficulty through the esophogus of the patient. Sedation performed by different physician. The patient was monitored while under deep sedation. Anesthestetic sedation was provided intravenously by Anesthesiology: 200mg  of Propofol, 100mg  of Lidocaine. Image quality was excellent. The patient's vital signs; including heart rate, blood pressure, and oxygen saturation; remained stable throughout the procedure. The patient developed no complications during the procedure.  IMPRESSIONS   1. The aortic valve is normal in structure. Aortic valve regurgitation is not visualized. 2. There is mild (Grade II) plaque involving the descending aorta. 3. Limited study to R/O aortic dissection during atrial fibrillation ablation; no dissection noted; trileaflet aortic valve with no AI.  Conclusion(s)/Recommendation(s): Normal biventricular function without evidence of hemodynamically significant valvular heart disease.  FINDINGS    Aortic Valve: The aortic valve is normal in structure. Aortic valve regurgitation is not visualized.   Aorta: The aortic root is normal in size and structure. There is mild (Grade II) plaque involving the descending aorta.   Additional Comments: Limited study to R/O aortic dissection during atrial fibrillation ablation; no dissection noted; trileaflet aortic valve with no AI.  Olga Millers MD Electronically signed by Olga Millers MD Signature Date/Time: 06/29/2021/2:27:37 PM    Final             EKG:  EKG is ordered today.  The ekg ordered today demonstrates normal sinus rhythm, HR 65 BPM. No significant changes when compared to EKG from 09/28/21  Recent Labs: 10/25/2021: BUN 23; Creatinine, Ser 1.00; Hemoglobin 9.9;  Platelets 270; Potassium 4.2; Sodium 140  Recent Lipid Panel    Component Value Date/Time   CHOL 179 09/09/2014 1150   TRIG 191.0 (H) 09/09/2014 1150   HDL 40.10 09/09/2014 1150   CHOLHDL 4 09/09/2014 1150   VLDL 38.2 09/09/2014  1150   LDLCALC 100 (H) 09/09/2014 1150   LDLDIRECT 134.8 03/07/2012 1551     Risk Assessment/Calculations:    CHA2DS2-VASc Score = 1   This indicates a 0.6% annual risk of stroke. The patient's score is based upon: CHF History: 0 HTN History: 1 Diabetes History: 0 Stroke History: 0 Vascular Disease History: 0 Age Score: 0 Gender Score: 0           STOP-Bang Score:          Physical Exam:    VS:  BP 138/88   Pulse 65   Ht 5\' 10"  (1.778 m)   Wt 210 lb 12.8 oz (95.6 kg)   SpO2 94%   BMI 30.25 kg/m     Wt Readings from Last 3 Encounters:  06/14/22 210 lb 12.8 oz (95.6 kg)  11/01/21 223 lb 2 oz (101.2 kg)  10/25/21 223 lb 2 oz (101.2 kg)     GEN: Well nourished, well developed in no acute distress. Sitting comfortably on the exam table in no acute distress  HEENT: Normal NECK: No JVD; No carotid bruits CARDIAC: RRR, no murmurs, rubs, gallops. Radial pulses 2+ bilaterally  RESPIRATORY:  Clear to auscultation without rales, wheezing or rhonchi. Normal WOB on room air  ABDOMEN: Soft, non-tender, non-distended MUSCULOSKELETAL:  No edema in BLE; No deformity  SKIN: Warm and dry NEUROLOGIC:  Alert and oriented x 3 PSYCHIATRIC:  Normal affect   ASSESSMENT:    1. Preop examination   2. Paroxysmal atrial fibrillation (HCC)   3. Essential hypertension   4. OSA (obstructive sleep apnea)   5. Iron deficiency anemia, unspecified iron deficiency anemia type    PLAN:    In order of problems listed above:  Preoperative Risk Assessment  - Most recent ischemic evaluation from 06/2021 was a nuclear stress test that was a normal, low risk study. No evidence of ischemia or infarction. EF was 56% - Patient denies chest pain, dyspnea on exertion.  He has not had any issues with exercise intolerance.  - Patient is able to complete greater than 4 mets of physical activity without chest pain or DOE. Denies syncope, near syncope, dizziness, lightheadedness. No sustained palpitations or tachycardia. No indication for further cardiac testing prior to surgery  - He is on eliquis 5 mg BID for atrial fibrillation. Per pharmacy, patient does not need to hold anticoagulation prior to stitch removal   Paroxysmal Atrial Fibrillation s/p ablation  - Patient previously had an ablation in 06/2021 - Denies sustained palpitations, chest pain, tachycardia, shortness of breath. Rarely, he feels like his heart skips a beat, but no palpitations otherwise. Maintaining NSR in office today  - Continue eliquis 5 mg BID. Although patient is post-ablation and has a low-CHADS-VASc, he would prefer to remain on anticoagulation as his father died from a stroke.  - per pharmacy, patient does not need to hold anticoagulation for stitch removal  - Continue metoprolol tartate 12.5 mg BID   HTN  - Patient's PCP recently started HCTZ 12.5 mg daily. He also lost about 25 lbs in the past 3 months, and BP has been better controlled - Patient reports that his BP at home has been consistently in the 120s/80s for the past few weeks - BP in office 138/88 - Continue hctz 12.5 mg daily, losartan 50 mg daily  - Ordered BMP to assess renal function and electrolytes on HCTZ, losartan  - Instructed patient to monitor BP and contact either our office or his PCP if  SBP consistently >135   OSA - In the past, patient has not been able to tolerate CPAP  - Patient recently started [facility] and has lost about 25 lbs in past 3 months. Since his weight loss, he has noticed that he is sleeping through the night, no longer snores, and feels better rested   Iron deficiency Anemia  - CBC from 05/18/22 showed hemoglobin 10.2. Iron was low at 19. Fecal occult blood was negative  - He has been on iron  supplementation since then  - Ordered CBC   Medication Adjustments/Labs and Tests Ordered: Current medicines are reviewed at length with the patient today.  Concerns regarding medicines are outlined above.  Orders Placed This Encounter  Procedures   Basic metabolic panel   CBC   EKG 12-Lead   No orders of the defined types were placed in this encounter.   Patient Instructions  Medication Instructions:   Your physician recommends that you continue on your current medications as directed. Please refer to the Current Medication list given to you today.  *If you need a refill on your cardiac medications before your next appointment, please call your pharmacy*  Lab Work: Your physician recommends that you return for lab work TODAY:  BMP CBC  If you have labs (blood work) drawn today and your tests are completely normal, you will receive your results only by: MyChart Message (if you have MyChart) OR A paper copy in the mail If you have any lab test that is abnormal or we need to change your treatment, we will call you to review the results.  Testing/Procedures: NONE ordered at this time of appointment   Follow-Up: At [facility], you and your health needs are our priority.  As part of our continuing mission to provide you with exceptional heart care, we have created designated Provider Care Teams.  These Care Teams include your primary Cardiologist (physician) and Advanced Practice Providers (APPs -  Physician Assistants and Nurse Practitioners) who all work together to provide you with the care you need, when you need it.  We recommend signing up for the patient portal called "MyChart".  Sign up information is provided on this After Visit Summary.  MyChart is used to connect with patients for Virtual Visits (Telemedicine).  Patients are able to view lab/test results, encounter notes, upcoming appointments, etc.  Non-urgent messages can be sent to your provider as well.   To  learn more about what you can do with MyChart, go to [email or web address].    Your next appointment:   6 month(s)  Provider:   Jodelle Red, MD    Other Instructions     Signed, Jonita Albee, PA-C  06/14/2022 12:33 PM    [facility]

## 2022-06-14 ENCOUNTER — Telehealth (HOSPITAL_BASED_OUTPATIENT_CLINIC_OR_DEPARTMENT_OTHER): Payer: Self-pay | Admitting: Cardiology

## 2022-06-14 ENCOUNTER — Encounter: Payer: Self-pay | Admitting: Physician Assistant

## 2022-06-14 ENCOUNTER — Ambulatory Visit: Payer: BC Managed Care – PPO | Attending: Physician Assistant | Admitting: Cardiology

## 2022-06-14 VITALS — BP 138/88 | HR 65 | Ht 70.0 in | Wt 210.8 lb

## 2022-06-14 DIAGNOSIS — I10 Essential (primary) hypertension: Secondary | ICD-10-CM | POA: Diagnosis not present

## 2022-06-14 DIAGNOSIS — I480 Paroxysmal atrial fibrillation: Secondary | ICD-10-CM | POA: Diagnosis not present

## 2022-06-14 DIAGNOSIS — G4733 Obstructive sleep apnea (adult) (pediatric): Secondary | ICD-10-CM

## 2022-06-14 DIAGNOSIS — Z01818 Encounter for other preprocedural examination: Secondary | ICD-10-CM

## 2022-06-14 DIAGNOSIS — D509 Iron deficiency anemia, unspecified: Secondary | ICD-10-CM

## 2022-06-14 NOTE — Telephone Encounter (Signed)
Hollie is calling stating they have received the clearance request from our office several times, but they are still missing page 18.   She is requesting just page 18 be sent to the fax # listed in the clearance.   Please advise.

## 2022-06-14 NOTE — Patient Instructions (Signed)
Medication Instructions:   Your physician recommends that you continue on your current medications as directed. Please refer to the Current Medication list given to you today.  *If you need a refill on your cardiac medications before your next appointment, please call your pharmacy*  Lab Work: Your physician recommends that you return for lab work TODAY:  BMP CBC  If you have labs (blood work) drawn today and your tests are completely normal, you will receive your results only by: MyChart Message (if you have MyChart) OR A paper copy in the mail If you have any lab test that is abnormal or we need to change your treatment, we will call you to review the results.  Testing/Procedures: NONE ordered at this time of appointment   Follow-Up: At [facility], you and your health needs are our priority.  As part of our continuing mission to provide you with exceptional heart care, we have created designated Provider Care Teams.  These Care Teams include your primary Cardiologist (physician) and Advanced Practice Providers (APPs -  Physician Assistants and Nurse Practitioners) who all work together to provide you with the care you need, when you need it.  We recommend signing up for the patient portal called "MyChart".  Sign up information is provided on this After Visit Summary.  MyChart is used to connect with patients for Virtual Visits (Telemedicine).  Patients are able to view lab/test results, encounter notes, upcoming appointments, etc.  Non-urgent messages can be sent to your provider as well.   To learn more about what you can do with MyChart, go to [email or web address].    Your next appointment:   6 month(s)  Provider:   Jodelle Red, MD    Other Instructions

## 2022-06-14 NOTE — Telephone Encounter (Signed)
I called Justin Young back with surgeon's office about page 18 missing in the clearance notes faxed over today. Page 18 only reflects the level of service from the ov today. Nothing pertinent to the clearance notes. Though I did re-fax the notes to [facility] [phone].

## 2022-06-15 ENCOUNTER — Ambulatory Visit: Payer: BC Managed Care – PPO

## 2022-06-15 ENCOUNTER — Telehealth: Payer: Self-pay

## 2022-06-15 LAB — BASIC METABOLIC PANEL
BUN/Creatinine Ratio: 23 (ref 10–24)
BUN: 19 mg/dL (ref 8–27)
CO2: 28 mmol/L (ref 20–29)
Calcium: 9.9 mg/dL (ref 8.6–10.2)
Chloride: 100 mmol/L (ref 96–106)
Creatinine, Ser: 0.82 mg/dL (ref 0.76–1.27)
Glucose: 100 mg/dL — ABNORMAL HIGH (ref 70–99)
Potassium: 4.5 mmol/L (ref 3.5–5.2)
Sodium: 138 mmol/L (ref 134–144)
eGFR: 101 mL/min/{1.73_m2} (ref >59)

## 2022-06-15 LAB — CBC
Hematocrit: 39.2 % (ref 37.5–51.0)
Hemoglobin: 11.4 g/dL — ABNORMAL LOW (ref 13.0–17.7)
MCH: 22.3 pg — ABNORMAL LOW (ref 26.6–33.0)
MCHC: 29.1 g/dL — ABNORMAL LOW (ref 31.5–35.7)
MCV: 77 fL — ABNORMAL LOW (ref 79–97)
Platelets: 281 10*3/uL (ref 150–450)
RBC: 5.12 x10E6/uL (ref 4.14–5.80)
RDW: 21.7 % — ABNORMAL HIGH (ref 11.6–15.4)
WBC: 6.4 10*3/uL (ref 3.4–10.8)

## 2022-06-15 NOTE — Telephone Encounter (Signed)
Patient is aware of lab results and provider recommendations. Verbalized understanding. 

## 2022-06-15 NOTE — Telephone Encounter (Signed)
-----   Message from Jonita Albee, [place] sent at 06/15/2022 12:17 PM EDT ----- Please tell patient that his kidney function and electrolytes are within normal limits on his current medications. His hemoglobin remains a bit low, but has improved to 11.4. He should continue his current medications and follow up as arranged  Thanks! KJ

## 2022-09-17 ENCOUNTER — Other Ambulatory Visit (HOSPITAL_BASED_OUTPATIENT_CLINIC_OR_DEPARTMENT_OTHER): Payer: Self-pay | Admitting: Cardiology

## 2022-09-17 DIAGNOSIS — I10 Essential (primary) hypertension: Secondary | ICD-10-CM

## 2022-12-28 ENCOUNTER — Ambulatory Visit (HOSPITAL_BASED_OUTPATIENT_CLINIC_OR_DEPARTMENT_OTHER): Payer: BC Managed Care – PPO | Admitting: Cardiology

## 2022-12-30 ENCOUNTER — Ambulatory Visit (HOSPITAL_BASED_OUTPATIENT_CLINIC_OR_DEPARTMENT_OTHER): Payer: BC Managed Care – PPO | Admitting: Family

## 2023-03-12 ENCOUNTER — Other Ambulatory Visit: Payer: Self-pay

## 2023-03-12 ENCOUNTER — Emergency Department (HOSPITAL_BASED_OUTPATIENT_CLINIC_OR_DEPARTMENT_OTHER)
Admission: EM | Admit: 2023-03-12 | Discharge: 2023-03-12 | Disposition: A | Discharge status: Home / Self Care | Payer: BC Managed Care – PPO | Attending: Emergency Medicine | Admitting: Emergency Medicine

## 2023-03-12 ENCOUNTER — Encounter (HOSPITAL_BASED_OUTPATIENT_CLINIC_OR_DEPARTMENT_OTHER): Payer: Self-pay

## 2023-03-12 ENCOUNTER — Emergency Department (HOSPITAL_BASED_OUTPATIENT_CLINIC_OR_DEPARTMENT_OTHER): Payer: BC Managed Care – PPO

## 2023-03-12 DIAGNOSIS — S0081XA Abrasion of other part of head, initial encounter: Secondary | ICD-10-CM | POA: Diagnosis not present

## 2023-03-12 DIAGNOSIS — Y9301 Activity, walking, marching and hiking: Secondary | ICD-10-CM | POA: Insufficient documentation

## 2023-03-12 DIAGNOSIS — S50311A Abrasion of right elbow, initial encounter: Secondary | ICD-10-CM | POA: Insufficient documentation

## 2023-03-12 DIAGNOSIS — Z79899 Other long term (current) drug therapy: Secondary | ICD-10-CM | POA: Insufficient documentation

## 2023-03-12 DIAGNOSIS — I480 Paroxysmal atrial fibrillation: Secondary | ICD-10-CM | POA: Diagnosis not present

## 2023-03-12 DIAGNOSIS — W228XXA Striking against or struck by other objects, initial encounter: Secondary | ICD-10-CM | POA: Diagnosis not present

## 2023-03-12 DIAGNOSIS — Z7901 Long term (current) use of anticoagulants: Secondary | ICD-10-CM | POA: Insufficient documentation

## 2023-03-12 DIAGNOSIS — S0990XA Unspecified injury of head, initial encounter: Secondary | ICD-10-CM

## 2023-03-12 DIAGNOSIS — I10 Essential (primary) hypertension: Secondary | ICD-10-CM | POA: Insufficient documentation

## 2023-03-12 DIAGNOSIS — Y92094 Garage of other non-institutional residence as the place of occurrence of the external cause: Secondary | ICD-10-CM | POA: Insufficient documentation

## 2023-03-12 NOTE — Discharge Instructions (Signed)
 As discussed, CT scan of your head was without evidence of brain bleed or other abnormality.  You do have significant amount of bulging disc in your spine C5-C6.  Recommend following up with your primary care for reassessment.  You may take Tylenol for pain.  Please do not hesitate to return if the worrisome signs and symptoms we discussed become apparent.

## 2023-03-12 NOTE — ED Provider Notes (Signed)
 [place] [facility] AT [facility] Provider Note   CSN: [id number] Arrival date & time: 03/12/23  1423     History  Chief Complaint  Patient presents with   Head Injury    Justin Young is a 63 y.o. male.   Head Injury   63 year old male presents emergency department with complaints of head injury.  States that he was carrying something into his garage and had his vision obstructed and hit the front of his head on the garage door.  Denies any LOC.  Patient is on Eliquis.  States that he lost his balance a little bit and ended up hitting the wall with his right arm/side.  Does have a scratch on his right elbow but not much pain.  Denies any visual disturbance, gait abnormality observed speech, facial droop, weakness/sensory deficits in upper or lower extremities.  Past medical history significant for GERD, hypertension, paroxysmal atrial fibrillation on Eliquis, spinal stenosis, syncope  Home Medications Prior to Admission medications   Medication Sig Start Date End Date Taking? Authorizing Provider  clonazePAM (KLONOPIN) 0.5 MG tablet Take 0.5-1 mg by mouth at bedtime as needed (sleep). 04/23/19   [provider]  ELIQUIS 5 MG TABS tablet TAKE 1 TABLET BY MOUTH TWICE A DAY 09/21/21   Allred, Fayrene Fearing, MD  ferrous sulfate 325 (65 FE) MG tablet Take 325 mg by mouth daily. 05/20/22 05/20/23  [provider]  hydrochlorothiazide (HYDRODIURIL) 12.5 MG tablet Take 12.5 mg by mouth daily. 05/18/22 05/18/23  [provider]  loratadine (CLARITIN) 10 MG tablet Take 10 mg by mouth daily.    [provider]  losartan (COZAAR) 50 MG tablet TAKE 1 TABLET BY MOUTH EVERY DAY 09/20/22   Lanier Prude, MD  metoprolol tartrate (LOPRESSOR) 25 MG tablet Take 0.5 tablets (12.5 mg total) by mouth 2 (two) times daily. 09/21/21   Newman Nip, NP  omeprazole (PRILOSEC) 20 MG capsule Take 20 mg by mouth See admin instructions. Take 20 mg daily, may take a  second 20 mg dose as needed for heartburn    [provider]      Allergies    Amlodipine, Sulfa antibiotics, and Codeine    Review of Systems   Review of Systems  All other systems reviewed and are negative.   Physical Exam Updated Vital Signs BP (!) 184/95   Pulse 66   Temp 98 F (36.7 C) (Temporal)   Resp 20   Ht 5\' 10"  (1.778 m)   Wt 86.2 kg   SpO2 100%   BMI 27.26 kg/m  Physical Exam Vitals and nursing note reviewed.  Constitutional:      General: He is not in acute distress.    Appearance: He is well-developed.  HENT:     Head: Normocephalic.     Comments: Swelling as well as superficial abrasion appreciated top of forehead. Eyes:     Conjunctiva/sclera: Conjunctivae normal.  Cardiovascular:     Rate and Rhythm: Normal rate and regular rhythm.  Pulmonary:     Effort: Pulmonary effort is normal. No respiratory distress.     Breath sounds: Normal breath sounds. No wheezing, rhonchi or rales.  Abdominal:     Palpations: Abdomen is soft.     Tenderness: There is no abdominal tenderness.  Musculoskeletal:        General: No swelling.     Cervical back: Neck supple.     Comments: No midline tenderness or paraspinal tenderness noted in cervical, thoracic,  lumbar spine.  No chest wall tenderness.  Patient is unfortunately is without difficulty without overlying tenderness.  Small abrasion appreciated right elbow.  Skin:    General: Skin is warm and dry.     Capillary Refill: Capillary refill takes less than 2 seconds.  Neurological:     Mental Status: He is alert.     Comments: Alert and oriented to self, place, time and event.   Speech is fluent, clear without dysarthria or dysphasia.   Strength 5/5 in upper/lower extremities   Sensation intact in upper/lower extremities   Normal gait.  CN I not tested  CN II not tested CN III, IV, VI PERRLA and EOMs intact bilaterally  CN V Intact sensation to sharp and light touch to the face  CN VII facial  movements symmetric  CN VIII not tested  CN IX, X no uvula deviation, symmetric rise of soft palate  CN XI 5/5 SCM and trapezius strength bilaterally  CN XII Midline tongue protrusion, symmetric L/R movements     Psychiatric:        Mood and Affect: Mood normal.     ED Results / Procedures / Treatments   Labs (all labs ordered are listed, but only abnormal results are displayed) Labs Reviewed - No data to display  EKG None  Radiology CT Head Wo Contrast Result Date: 03/12/2023 CLINICAL DATA:  Head trauma, moderate-severe; Neck trauma, midline tenderness (Age 74-64y) EXAM: CT HEAD WITHOUT CONTRAST CT CERVICAL SPINE WITHOUT CONTRAST TECHNIQUE: Multidetector CT imaging of the head and cervical spine was performed following the standard protocol without intravenous contrast. Multiplanar CT image reconstructions of the cervical spine were also generated. RADIATION DOSE REDUCTION: This exam was performed according to the departmental dose-optimization program which includes automated exposure control, adjustment of the mA and/or kV according to patient size and/or use of iterative reconstruction technique. COMPARISON:  None Available. FINDINGS: CT HEAD FINDINGS Brain: No hemorrhage. No hydrocephalus. No extra-axial fluid collection. No mass effect. No mass lesion. No CT evidence of an acute cortical infarct. Vascular: No hyperdense vessel or unexpected calcification. Skull: Normal. Negative for fracture or focal lesion. Sinuses/Orbits: No middle ear or mastoid effusion. Paranasal sinuses are clear. Orbits are unremarkable. Other: None. CT CERVICAL SPINE FINDINGS Alignment: Straightening of the normal cervical lordosis. Mild retrolisthesis of C5 on C6 Skull base and vertebrae: No acute fracture. No primary bone lesion or focal pathologic process. Soft tissues and spinal canal: No prevertebral fluid or swelling. No visible canal hematoma. Disc levels: Calcified disc bulge at C5-C6 results in moderate  to severe spinal canal stenosis. Upper chest: Negative. Other: None IMPRESSION: 1. No acute intracranial abnormality. 2. No acute fracture or traumatic subluxation of the cervical spine. 3. Calcified disc bulge at C5-C6 results in moderate to severe spinal canal stenosis. Electronically Signed   By: Lorenza Cambridge M.D.   On: 03/12/2023 15:15   CT Cervical Spine Wo Contrast Result Date: 03/12/2023 CLINICAL DATA:  Head trauma, moderate-severe; Neck trauma, midline tenderness (Age 50-64y) EXAM: CT HEAD WITHOUT CONTRAST CT CERVICAL SPINE WITHOUT CONTRAST TECHNIQUE: Multidetector CT imaging of the head and cervical spine was performed following the standard protocol without intravenous contrast. Multiplanar CT image reconstructions of the cervical spine were also generated. RADIATION DOSE REDUCTION: This exam was performed according to the departmental dose-optimization program which includes automated exposure control, adjustment of the mA and/or kV according to patient size and/or use of iterative reconstruction technique. COMPARISON:  None Available. FINDINGS: CT HEAD FINDINGS Brain: No  hemorrhage. No hydrocephalus. No extra-axial fluid collection. No mass effect. No mass lesion. No CT evidence of an acute cortical infarct. Vascular: No hyperdense vessel or unexpected calcification. Skull: Normal. Negative for fracture or focal lesion. Sinuses/Orbits: No middle ear or mastoid effusion. Paranasal sinuses are clear. Orbits are unremarkable. Other: None. CT CERVICAL SPINE FINDINGS Alignment: Straightening of the normal cervical lordosis. Mild retrolisthesis of C5 on C6 Skull base and vertebrae: No acute fracture. No primary bone lesion or focal pathologic process. Soft tissues and spinal canal: No prevertebral fluid or swelling. No visible canal hematoma. Disc levels: Calcified disc bulge at C5-C6 results in moderate to severe spinal canal stenosis. Upper chest: Negative. Other: None IMPRESSION: 1. No acute  intracranial abnormality. 2. No acute fracture or traumatic subluxation of the cervical spine. 3. Calcified disc bulge at C5-C6 results in moderate to severe spinal canal stenosis. Electronically Signed   By: Lorenza Cambridge M.D.   On: 03/12/2023 15:15    Procedures Procedures    Medications Ordered in ED Medications - No data to display  ED Course/ Medical Decision Making/ A&P                                 Medical Decision Making Amount and/or Complexity of Data Reviewed Radiology: ordered.   This patient presents to the ED for concern of head injury, this involves an extensive number of treatment options, and is a complaint that carries with it a high risk of complications and morbidity.  The differential diagnosis includes CVA, fracture, dislocation, other   Co morbidities that complicate the patient evaluation  See HPI   Additional history obtained:  Additional history obtained from EMR External records from outside source obtained and reviewed including hospital records   Lab Tests:  N/a   Imaging Studies ordered:  I ordered imaging studies including CT head/cervical spine I independently visualized and interpreted imaging which showed no acute intracranial abnormality.  No acute fracture or traumatic substitution of the cervical spine. I agree with the radiologist interpretation   Cardiac Monitoring: / EKG:  The patient was maintained on a cardiac monitor.  I personally viewed and interpreted the cardiac monitored which showed an underlying rhythm of: Sinus rhythm   Consultations Obtained:  N/a   Problem List / ED Course / Critical interventions / Medication management  Head injury  Reevaluation of the patient showed that the patient stayed the same I have reviewed the patients home medicines and have made adjustments as needed   Social Determinants of Health:  Former cigarette use.  Denies illicit drug use.   Test / Admission -  Considered:  Head injury Vitals signs significant for hypertension. Otherwise within normal range and stable throughout visit. Imaging studies significant for: See above 63 year old male presents emergency department with complaints of head injury.  Accidentally walked into a garage door hitting the top of his head there.  No LOC the patient is on Eliquis.  Nonfocal neuroexam.  CT imaging obtained by triage staff of which was negative for any acute osseous abnormality or acute fracture or traumatic subluxation of cervical spine.  Patient reassured by findings.  Recommend use of Tylenol for treatment of pain and follow-up with PCP in the outpatient setting.  Treatment plan discussed with the patient and he acknowledged understanding was agreeable to said plan.  Patient overall well-appearing, afebrile in no acute distress. Worrisome signs and symptoms were discussed with the patient,  and the patient acknowledged understanding to return to the ED if noticed. Patient was stable upon discharge.          Final Clinical Impression(s) / ED Diagnoses Final diagnoses:  Injury of head, initial encounter    Rx / DC Orders ED Discharge Orders     None         Peter Garter, [place] 03/12/23 1553    Royanne Foots, DO 03/13/23 1717

## 2023-03-12 NOTE — ED Triage Notes (Signed)
 Patient arrives with complaints of hitting his head due to walking into the corner of a door. Patient states that he did hit his head hard, but no LOC. He is on Eliquis due to atrial fib.

## 2023-03-12 NOTE — ED Notes (Signed)
 Patient given discharge instructions. Questions were answered. Patient verbalized understanding of discharge instructions and care at home.

## 2023-06-13 NOTE — Progress Notes (Deleted)
  Electrophysiology Office Follow up Visit Note:    Date:  06/13/2023   ID:  Justin Young, DOB 02/17/1960, MRN [id number]  PCP:  [facility]  [facility] Cardiologist:  Sheryle Donning, MD  [facility] Electrophysiologist:  Boyce Byes, MD    Interval History:     Justin Young is a 63 y.o. male who presents for a follow up visit.   The patient was most recently seen in the cardiology clinic Jun 14, 2018 for by Justin Young.  The patient has a history of atrial fibrillation with a prior catheter ablation in June 2023.  Also with a history of hypertension, sleep apnea, GERD.  Anticoagulation was discussed with the patient during the 2024 appointment with Justin Young but the patient preferred to stay on anticoagulation given his father died of a stroke.  Since his last cardiology appointment he was seen in the emergency department in February 2025 after a fall.  He struck his head and presented for evaluation given his use of Eliquis .       Past medical, surgical, social and family history were reviewed.  ROS:   Please see the history of present illness.    All other systems reviewed and are negative.  EKGs/Labs/Other Studies Reviewed:    The following studies were reviewed today:          Physical Exam:    VS:  There were no vitals taken for this visit.    Wt Readings from Last 3 Encounters:  03/12/23 190 lb (86.2 kg)  06/14/22 210 lb 12.8 oz (95.6 kg)  11/01/21 223 lb 2 oz (101.2 kg)     GEN: no distress CARD: RRR, No MRG RESP: No IWOB. CTAB.      ASSESSMENT:    No diagnosis found. PLAN:    In order of problems listed above:  #Atrial fibrillation Doing well after his 2023 catheter ablation.  No sustained recurrence.  On Eliquis  for stroke prophylaxis.  #Hypertension *** goal today.  Recommend checking blood pressures 1-2 times per week at home and recording the values.  Recommend bringing  these recordings to the primary care physician.  Follow-up with EP on an as-needed basis   Signed, Harvie Liner, MD, [facility], [facility] 06/13/2023 9:54 PM    Electrophysiology  [facility]

## 2023-06-14 ENCOUNTER — Ambulatory Visit: Attending: Cardiology | Admitting: Cardiology

## 2023-06-14 DIAGNOSIS — I480 Paroxysmal atrial fibrillation: Secondary | ICD-10-CM

## 2023-06-14 DIAGNOSIS — I10 Essential (primary) hypertension: Secondary | ICD-10-CM

## 2023-06-15 ENCOUNTER — Encounter: Payer: Self-pay | Admitting: Cardiology

## 2023-09-24 ENCOUNTER — Other Ambulatory Visit (HOSPITAL_BASED_OUTPATIENT_CLINIC_OR_DEPARTMENT_OTHER): Payer: Self-pay | Admitting: Cardiology

## 2023-09-24 DIAGNOSIS — I10 Essential (primary) hypertension: Secondary | ICD-10-CM

## 2023-10-20 ENCOUNTER — Other Ambulatory Visit (HOSPITAL_BASED_OUTPATIENT_CLINIC_OR_DEPARTMENT_OTHER): Payer: Self-pay | Admitting: Cardiology

## 2023-10-20 DIAGNOSIS — I10 Essential (primary) hypertension: Secondary | ICD-10-CM

## 2023-11-24 ENCOUNTER — Other Ambulatory Visit (HOSPITAL_BASED_OUTPATIENT_CLINIC_OR_DEPARTMENT_OTHER): Payer: Self-pay | Admitting: Cardiology

## 2023-11-24 DIAGNOSIS — I10 Essential (primary) hypertension: Secondary | ICD-10-CM

## 2023-12-09 ENCOUNTER — Other Ambulatory Visit (HOSPITAL_BASED_OUTPATIENT_CLINIC_OR_DEPARTMENT_OTHER): Payer: Self-pay | Admitting: Cardiology

## 2023-12-09 DIAGNOSIS — I10 Essential (primary) hypertension: Secondary | ICD-10-CM

## 2023-12-24 NOTE — Progress Notes (Deleted)
  Electrophysiology Office Follow up Visit Note:    Date:  12/24/2023   ID:  Justin Young, DOB 02/12/1960, MRN [id number]  PCP:  [facility]  [facility] Cardiologist:  Shelda Bruckner, MD  [facility] Electrophysiologist:  OLE ONEIDA HOLTS, MD    Interval History:     Justin Young is a 63 y.o. male who presents for a follow up visit.   I last saw the patient September 27, 2021.  Patient has a history of atrial fibrillation with prior catheter ablation in June 2023.  The patient was previously on Eliquis  for stroke prophylaxis.        Past medical, surgical, social and family history were reviewed.  ROS:   Please see the history of present illness.    All other systems reviewed and are negative.  EKGs/Labs/Other Studies Reviewed:    The following studies were reviewed today:          Physical Exam:    VS:  There were no vitals taken for this visit.    Wt Readings from Last 3 Encounters:  03/12/23 190 lb (86.2 kg)  06/14/22 210 lb 12.8 oz (95.6 kg)  11/01/21 223 lb 2 oz (101.2 kg)     GEN: no distress CARD: RRR, No MRG RESP: No IWOB. CTAB.      ASSESSMENT:    No diagnosis found. PLAN:    In order of problems listed above:  #Atrial fibrillation Prior catheter ablation in 2023 Eliquis  for stroke prophylaxis.  Given low CHA2DS2-VASc of 1, it is reasonable for him to transition to aspirin  81 mg by mouth once daily for stroke prophylaxis.  Pros and cons of this approach were discussed in detail the patient and he is agreeable to this plan.***  #Hypertension *** goal today.  Recommend checking blood pressures 1-2 times per week at home and recording the values.  Recommend bringing these recordings to the primary care physician.  I discussed my upcoming departure from [facility] during today's clinic appointment.  The patient will continue to follow-up with one of my EP partners moving forward.  Follow-up 1  year with EP APP   Signed, Ole Holts, MD, [facility], [facility] 12/24/2023 11:48 AM    Electrophysiology [facility]

## 2023-12-25 ENCOUNTER — Encounter: Payer: Self-pay | Admitting: Cardiology

## 2023-12-25 ENCOUNTER — Ambulatory Visit: Attending: Cardiology | Admitting: Cardiology

## 2023-12-25 DIAGNOSIS — I480 Paroxysmal atrial fibrillation: Secondary | ICD-10-CM

## 2023-12-25 DIAGNOSIS — I10 Essential (primary) hypertension: Secondary | ICD-10-CM

## 2024-01-01 ENCOUNTER — Other Ambulatory Visit (HOSPITAL_BASED_OUTPATIENT_CLINIC_OR_DEPARTMENT_OTHER): Payer: Self-pay | Admitting: Cardiology

## 2024-01-01 DIAGNOSIS — I10 Essential (primary) hypertension: Secondary | ICD-10-CM
